# Patient Record
Sex: Male | Born: 1950 | Race: White | Hispanic: No | Marital: Single | Smoking: Never smoker
Health system: Southern US, Community
[De-identification: ages and names within clinical notes are randomized; demographics above are authoritative.]

## PROBLEM LIST (undated history)

## (undated) DIAGNOSIS — F845 Asperger's syndrome: Secondary | ICD-10-CM

## (undated) DIAGNOSIS — Z8614 Personal history of Methicillin resistant Staphylococcus aureus infection: Secondary | ICD-10-CM

## (undated) DIAGNOSIS — J45909 Unspecified asthma, uncomplicated: Secondary | ICD-10-CM

## (undated) DIAGNOSIS — K5790 Diverticulosis of intestine, part unspecified, without perforation or abscess without bleeding: Secondary | ICD-10-CM

## (undated) DIAGNOSIS — D649 Anemia, unspecified: Secondary | ICD-10-CM

## (undated) DIAGNOSIS — I10 Essential (primary) hypertension: Secondary | ICD-10-CM

## (undated) DIAGNOSIS — K219 Gastro-esophageal reflux disease without esophagitis: Secondary | ICD-10-CM

## (undated) DIAGNOSIS — E871 Hypo-osmolality and hyponatremia: Secondary | ICD-10-CM

## (undated) DIAGNOSIS — L409 Psoriasis, unspecified: Secondary | ICD-10-CM

## (undated) DIAGNOSIS — I38 Endocarditis, valve unspecified: Secondary | ICD-10-CM

## (undated) DIAGNOSIS — I4891 Unspecified atrial fibrillation: Secondary | ICD-10-CM

## (undated) HISTORY — PX: COLONOSCOPY: SHX174

---

## 2005-08-08 ENCOUNTER — Ambulatory Visit: Payer: Self-pay | Admitting: Gastroenterology

## 2007-06-17 ENCOUNTER — Ambulatory Visit: Payer: Self-pay | Admitting: Family Medicine

## 2011-01-14 ENCOUNTER — Emergency Department: Payer: Self-pay | Admitting: Emergency Medicine

## 2014-09-20 DIAGNOSIS — I340 Nonrheumatic mitral (valve) insufficiency: Secondary | ICD-10-CM | POA: Insufficient documentation

## 2018-04-22 ENCOUNTER — Encounter: Payer: Self-pay | Admitting: *Deleted

## 2018-04-23 ENCOUNTER — Ambulatory Visit: Payer: Medicare Other | Admitting: Anesthesiology

## 2018-04-23 ENCOUNTER — Encounter
Admission: RE | Disposition: A | Discharge status: Home / Self Care | Payer: Self-pay | Source: Ambulatory Visit | Attending: Gastroenterology

## 2018-04-23 ENCOUNTER — Ambulatory Visit
Admission: RE | Admit: 2018-04-23 | Discharge: 2018-04-23 | Disposition: A | Discharge status: Home / Self Care | Payer: Medicare Other | Source: Ambulatory Visit | Attending: Gastroenterology | Admitting: Gastroenterology

## 2018-04-23 ENCOUNTER — Other Ambulatory Visit: Payer: Self-pay

## 2018-04-23 DIAGNOSIS — I4891 Unspecified atrial fibrillation: Secondary | ICD-10-CM | POA: Diagnosis not present

## 2018-04-23 DIAGNOSIS — K219 Gastro-esophageal reflux disease without esophagitis: Secondary | ICD-10-CM | POA: Diagnosis not present

## 2018-04-23 DIAGNOSIS — I10 Essential (primary) hypertension: Secondary | ICD-10-CM | POA: Insufficient documentation

## 2018-04-23 DIAGNOSIS — Z1211 Encounter for screening for malignant neoplasm of colon: Secondary | ICD-10-CM | POA: Diagnosis not present

## 2018-04-23 DIAGNOSIS — K644 Residual hemorrhoidal skin tags: Secondary | ICD-10-CM | POA: Insufficient documentation

## 2018-04-23 DIAGNOSIS — Z79899 Other long term (current) drug therapy: Secondary | ICD-10-CM | POA: Diagnosis not present

## 2018-04-23 DIAGNOSIS — F845 Asperger's syndrome: Secondary | ICD-10-CM | POA: Insufficient documentation

## 2018-04-23 DIAGNOSIS — J45909 Unspecified asthma, uncomplicated: Secondary | ICD-10-CM | POA: Insufficient documentation

## 2018-04-23 DIAGNOSIS — Z8614 Personal history of Methicillin resistant Staphylococcus aureus infection: Secondary | ICD-10-CM | POA: Diagnosis not present

## 2018-04-23 DIAGNOSIS — K5730 Diverticulosis of large intestine without perforation or abscess without bleeding: Secondary | ICD-10-CM | POA: Diagnosis not present

## 2018-04-23 HISTORY — DX: Endocarditis, valve unspecified: I38

## 2018-04-23 HISTORY — DX: Psoriasis, unspecified: L40.9

## 2018-04-23 HISTORY — PX: COLONOSCOPY WITH PROPOFOL: SHX5780

## 2018-04-23 HISTORY — DX: Hypo-osmolality and hyponatremia: E87.1

## 2018-04-23 HISTORY — DX: Anemia, unspecified: D64.9

## 2018-04-23 HISTORY — DX: Essential (primary) hypertension: I10

## 2018-04-23 HISTORY — DX: Unspecified asthma, uncomplicated: J45.909

## 2018-04-23 HISTORY — DX: Personal history of Methicillin resistant Staphylococcus aureus infection: Z86.14

## 2018-04-23 HISTORY — DX: Asperger's syndrome: F84.5

## 2018-04-23 HISTORY — DX: Unspecified atrial fibrillation: I48.91

## 2018-04-23 HISTORY — DX: Diverticulosis of intestine, part unspecified, without perforation or abscess without bleeding: K57.90

## 2018-04-23 HISTORY — DX: Gastro-esophageal reflux disease without esophagitis: K21.9

## 2018-04-23 SURGERY — COLONOSCOPY WITH PROPOFOL
Anesthesia: General

## 2018-04-23 MED ORDER — FENTANYL CITRATE (PF) 100 MCG/2ML IJ SOLN
INTRAMUSCULAR | Status: DC | PRN
  Administered 2018-04-23 (×2): 50 ug via INTRAVENOUS

## 2018-04-23 MED ORDER — FENTANYL CITRATE (PF) 100 MCG/2ML IJ SOLN
INTRAMUSCULAR | Status: AC
  Filled 2018-04-23: qty 2

## 2018-04-23 MED ORDER — PROPOFOL 500 MG/50ML IV EMUL
INTRAVENOUS | Status: AC
  Filled 2018-04-23: qty 50

## 2018-04-23 MED ORDER — LIDOCAINE 2% (20 MG/ML) 5 ML SYRINGE
INTRAMUSCULAR | Status: DC | PRN
  Administered 2018-04-23: 30 mg via INTRAVENOUS

## 2018-04-23 MED ORDER — PROPOFOL 10 MG/ML IV BOLUS
INTRAVENOUS | Status: DC | PRN
  Administered 2018-04-23: 100 mg via INTRAVENOUS

## 2018-04-23 MED ORDER — PHENYLEPHRINE HCL 10 MG/ML IJ SOLN
INTRAMUSCULAR | Status: DC | PRN
  Administered 2018-04-23: 100 ug via INTRAVENOUS

## 2018-04-23 MED ORDER — SODIUM CHLORIDE 0.9 % IV SOLN
INTRAVENOUS | Status: DC
  Administered 2018-04-23: 11:00:00 via INTRAVENOUS

## 2018-04-23 MED ORDER — PROPOFOL 500 MG/50ML IV EMUL
INTRAVENOUS | Status: DC | PRN
  Administered 2018-04-23: 160 ug/kg/min via INTRAVENOUS

## 2018-04-23 NOTE — Op Note (Signed)
[facility] Gastroenterology Patient Name: Carl HeinzDaniel Oo Procedure Date: 04/23/2018 11:05 AM MRN: [id number] Account #: [id number] Date of Birth: [birth year] Admit Type: Outpatient Age: [age] Room: [facility] ENDO ROOM 1 Gender: Male Note Status: Finalized Procedure:            Colonoscopy Indications:          Screening for colorectal malignant neoplasm Providers:            Christena DeemMartin U. Mirenda Baltazar, MD Referring MD:         Gracelyn NurseJohn D. Johnston, MD (Referring MD) Medicines:            Monitored Anesthesia Care Complications:        No immediate complications. Procedure:            Pre-Anesthesia Assessment:                       - ASA Grade Assessment: III - A patient with severe                        systemic disease.                       After obtaining informed consent, the colonoscope was                        passed under direct vision. Throughout the procedure,                        the patient's blood pressure, pulse, and oxygen                        saturations were monitored continuously. The                        Colonoscope was introduced through the anus and                        advanced to the the cecum, identified by appendiceal                        orifice and ileocecal valve. The colonoscopy was                        performed with moderate difficulty due to poor bowel                        prep. Successful completion of the procedure was aided                        by lavage. The quality of the bowel preparation was                        fair. Findings:      Multiple small and large-mouthed diverticula were found in the sigmoid       colon and descending colon.      The exam was otherwise without abnormality.      The retroflexed view of the distal rectum and anal verge was normal and       showed no anal or rectal abnormalities.      Non-bleeding external hemorrhoids were found during perianal  exam. The       hemorrhoids were small.      The  digital rectal exam was normal otherwise. Impression:           - Preparation of the colon was fair.                       - Diverticulosis in the sigmoid colon and in the                        descending colon.                       - The examination was otherwise normal.                       - The distal rectum and anal verge are normal on                        retroflexion view.                       - Non-bleeding external hemorrhoids.                       - No specimens collected. Recommendation:       - Discharge patient to home.                       - Repeat colonoscopy in 10 years for screening purposes. Procedure Code(s):    --- Professional ---                       [phone], Colonoscopy, flexible; diagnostic, including                        collection of specimen(s) by brushing or washing, when                        performed (separate procedure) Diagnosis Code(s):    --- Professional ---                       Z12.11, Encounter for screening for malignant neoplasm                        of colon                       K64.4, Residual hemorrhoidal skin tags                       K57.30, Diverticulosis of large intestine without                        perforation or abscess without bleeding CPT copyright 2017 American [employer] Association. All rights reserved. The codes documented in this report are preliminary and upon coder review may  be revised to meet current compliance requirements. Christena Deem, MD 04/23/2018 11:34:01 AM This report has been signed electronically. Number of Addenda: 0 Note Initiated On: 04/23/2018 11:05 AM Scope Withdrawal Time: 0 hours 4 minutes 56 seconds  Total Procedure Duration: 0 hours 20 minutes 1 second       [facility]

## 2018-04-23 NOTE — Anesthesia Postprocedure Evaluation (Signed)
Anesthesia Post Note  Patient: Carl HeinzDaniel Sinyard  Procedure(s) Performed: COLONOSCOPY WITH PROPOFOL (N/A )  Patient location during evaluation: Endoscopy Anesthesia Type: General Level of consciousness: awake and alert Pain management: pain level controlled Vital Signs Assessment: post-procedure vital signs reviewed and stable Respiratory status: spontaneous breathing and respiratory function stable Cardiovascular status: stable Anesthetic complications: no     Last Vitals:  Vitals:   04/23/18 1155 04/23/18 1220  BP: (!) 113/51   Pulse:  (!) 59  Resp:  13  Temp:    SpO2:  100%    Last Pain:  Vitals:   04/23/18 1220  TempSrc:   PainSc: 0-No pain                 KEPHART,Jacere K

## 2018-04-23 NOTE — Anesthesia Post-op Follow-up Note (Signed)
Anesthesia QCDR form completed.        

## 2018-04-23 NOTE — Transfer of Care (Signed)
Immediate Anesthesia Transfer of Care Note  Patient: Carl HeinzDaniel Scheck  Procedure(s) Performed: COLONOSCOPY WITH PROPOFOL (N/A )  Patient Location: PACU and Endoscopy Unit  Anesthesia Type:General  Level of Consciousness: drowsy  Airway & Oxygen Therapy: Patient Spontanous Breathing and Patient connected to nasal cannula oxygen  Post-op Assessment: Report given to RN and Post -op Vital signs reviewed and stable  Post vital signs: Reviewed and stable  Last Vitals:  Vitals Value Taken Time  BP    Temp    Pulse 65 04/23/2018 11:40 AM  Resp 10 04/23/2018 11:40 AM  SpO2 98 % 04/23/2018 11:40 AM  Vitals shown include unvalidated device data.  Last Pain:  Vitals:   04/23/18 1139  TempSrc: (P) Tympanic  PainSc:          Complications: No apparent anesthesia complications

## 2018-04-23 NOTE — H&P (Signed)
Outpatient short stay form Pre-procedure 04/23/2018 11:00 AM Carl DeemMartin U [name] MD  Primary Physician: [name], MD  Reason for visit: Colonoscopy  History of present illness: Patient is a [age] year old male presenting today as above.  His last colonoscopy was 2006.  At that time there were no polyps.  He tolerated his prep well.  He takes no aspirin or blood thinning agent.    Current Facility-Administered Medications:  .  0.9 %  sodium chloride infusion, , Intravenous, Continuous, Carl DeemSkulskie, [name], MD, Last Rate: 20 mL/hr at 04/23/18 1053  Medications Prior to Admission  Medication Sig Dispense Refill Last Dose  . ascorbic acid (VITAMIN C) 1000 MG tablet Take 1,000 mg by mouth daily.   Past Week at Unknown time  . atenolol (TENORMIN) 25 MG tablet Take by mouth 2 (two) times daily.    04/23/2018 at 0800  . Calcium Citrate-Vitamin D3 315-250 MG-UNIT TABS Take 2 tablets by mouth 2 (two) times daily.   Past Week at Unknown time  . folic acid (FOLVITE) 1 MG tablet Take 1 mg by mouth daily.   Past Week at Unknown time  . Garlic 100 MG TABS Take 1 tablet by mouth.   Past Week at Unknown time  . lisinopril (PRINIVIL,ZESTRIL) 10 MG tablet Take 10 mg by mouth daily.   04/23/2018 at 0000  . montelukast (SINGULAIR) 10 MG tablet Take 10 mg by mouth at bedtime.   04/22/2018 at Unknown time  . pantoprazole (PROTONIX) 40 MG tablet Take 40 mg by mouth daily.   04/22/2018 at Unknown time  . Zinc Citrate-Phytase 25-500 MG CAPS Take by mouth.   Past Week at Unknown time     No Known Allergies   Past Medical History:  Diagnosis Date  . Anemia   . Asperger's syndrome   . Asthma   . Atrial fibrillation (HCC)   . Diverticulosis   . GERD (gastroesophageal reflux disease)   . History of MRSA infection   . Hypertension   . Hyponatremia   . Psoriasis   . Valvular heart disease     Review of systems:      Physical Exam    Heart and lungs: Rate and rhythm without rub or gallop, lungs are  bilaterally clear.    HEENT: Normocephalic atraumatic eyes are anicteric    Other:    Pertinant exam for procedure: Soft nontender nondistended bowel sounds positive normoactive    Planned proceedures: Colonoscopy and indicated procedures. I have discussed the risks benefits and complications of procedures to include not limited to bleeding, infection, perforation and the risk of sedation and the patient wishes to proceed.    Carl DeemMartin U [name], MD Gastroenterology 04/23/2018  11:00 AM

## 2018-04-23 NOTE — OR Nursing (Signed)
Left leg brown colored skin, Dr. Marva PandaSkulskie aware, PVD statous dermatitis

## 2018-04-23 NOTE — Anesthesia Preprocedure Evaluation (Signed)
Anesthesia Evaluation  Patient identified by MRN, date of birth, ID band Patient awake    Reviewed: Allergy & Precautions, NPO status , Patient's Chart, lab work & pertinent test results  History of Anesthesia Complications Negative for: history of anesthetic complications  Airway Mallampati: III       Dental   Pulmonary asthma , neg sleep apnea, neg COPD,           Cardiovascular hypertension, Pt. on medications and Pt. on home beta blockers (-) Past MI and (-) CHF + dysrhythmias (hx of afib) Atrial Fibrillation (-) Valvular Problems/Murmurs     Neuro/Psych neg Seizures    GI/Hepatic Neg liver ROS, GERD  Medicated and Controlled,  Endo/Other  neg diabetes  Renal/GU negative Renal ROS     Musculoskeletal   Abdominal   Peds  Hematology  (+) anemia ,   Anesthesia Other Findings   Reproductive/Obstetrics                             Anesthesia Physical Anesthesia Plan  ASA: III  Anesthesia Plan: General   Post-op Pain Management:    Induction: Intravenous  PONV Risk Score and Plan: 2 and TIVA and Propofol infusion  Airway Management Planned: Nasal Cannula  Additional Equipment:   Intra-op Plan:   Post-operative Plan:   Informed Consent: I have reviewed the patients History and Physical, chart, labs and discussed the procedure including the risks, benefits and alternatives for the proposed anesthesia with the patient or authorized representative who has indicated his/her understanding and acceptance.     Plan Discussed with:   Anesthesia Plan Comments:         Anesthesia Quick Evaluation

## 2018-04-26 ENCOUNTER — Encounter: Payer: Self-pay | Admitting: Gastroenterology

## 2018-06-23 DIAGNOSIS — E119 Type 2 diabetes mellitus without complications: Secondary | ICD-10-CM | POA: Insufficient documentation

## 2021-04-28 ENCOUNTER — Encounter: Payer: Self-pay | Admitting: Emergency Medicine

## 2021-04-28 ENCOUNTER — Other Ambulatory Visit: Payer: Self-pay

## 2021-04-28 ENCOUNTER — Inpatient Hospital Stay
Admission: EM | Admit: 2021-04-28 | Discharge: 2021-04-29 | DRG: 641 | Disposition: A | Discharge status: Home / Self Care | Payer: Medicare Other | Attending: Internal Medicine | Admitting: Internal Medicine

## 2021-04-28 DIAGNOSIS — K219 Gastro-esophageal reflux disease without esophagitis: Secondary | ICD-10-CM | POA: Diagnosis present

## 2021-04-28 DIAGNOSIS — F845 Asperger's syndrome: Secondary | ICD-10-CM | POA: Diagnosis present

## 2021-04-28 DIAGNOSIS — E871 Hypo-osmolality and hyponatremia: Principal | ICD-10-CM | POA: Diagnosis present

## 2021-04-28 DIAGNOSIS — I480 Paroxysmal atrial fibrillation: Secondary | ICD-10-CM | POA: Diagnosis present

## 2021-04-28 DIAGNOSIS — I10 Essential (primary) hypertension: Secondary | ICD-10-CM | POA: Diagnosis present

## 2021-04-28 DIAGNOSIS — R42 Dizziness and giddiness: Secondary | ICD-10-CM | POA: Diagnosis not present

## 2021-04-28 DIAGNOSIS — R55 Syncope and collapse: Secondary | ICD-10-CM

## 2021-04-28 DIAGNOSIS — Z8614 Personal history of Methicillin resistant Staphylococcus aureus infection: Secondary | ICD-10-CM

## 2021-04-28 DIAGNOSIS — L409 Psoriasis, unspecified: Secondary | ICD-10-CM | POA: Diagnosis present

## 2021-04-28 DIAGNOSIS — K5900 Constipation, unspecified: Secondary | ICD-10-CM | POA: Diagnosis present

## 2021-04-28 DIAGNOSIS — Z20822 Contact with and (suspected) exposure to covid-19: Secondary | ICD-10-CM | POA: Diagnosis present

## 2021-04-28 DIAGNOSIS — I4891 Unspecified atrial fibrillation: Secondary | ICD-10-CM

## 2021-04-28 DIAGNOSIS — R531 Weakness: Secondary | ICD-10-CM | POA: Diagnosis present

## 2021-04-28 DIAGNOSIS — J45909 Unspecified asthma, uncomplicated: Secondary | ICD-10-CM

## 2021-04-28 LAB — URINALYSIS, COMPLETE (UACMP) WITH MICROSCOPIC
Bacteria, UA: NONE SEEN
Bilirubin Urine: NEGATIVE
Glucose, UA: 100 mg/dL — AB
Hgb urine dipstick: NEGATIVE
Ketones, ur: NEGATIVE mg/dL
Leukocytes,Ua: NEGATIVE
Nitrite: NEGATIVE
Protein, ur: NEGATIVE mg/dL
Specific Gravity, Urine: 1.01 (ref 1.005–1.030)
Squamous Epithelial / HPF: NONE SEEN (ref 0–5)
WBC, UA: NONE SEEN WBC/hpf (ref 0–5)
pH: 5.5 (ref 5.0–8.0)

## 2021-04-28 LAB — COMPREHENSIVE METABOLIC PANEL
ALT: 28 U/L (ref 0–44)
AST: 27 U/L (ref 15–41)
Albumin: 4.1 g/dL (ref 3.5–5.0)
Alkaline Phosphatase: 49 U/L (ref 38–126)
Anion gap: 9 (ref 5–15)
BUN: 12 mg/dL (ref 8–23)
CO2: 23 mmol/L (ref 22–32)
Calcium: 7.9 mg/dL — ABNORMAL LOW (ref 8.9–10.3)
Chloride: 83 mmol/L — ABNORMAL LOW (ref 98–111)
Creatinine, Ser: 0.61 mg/dL (ref 0.61–1.24)
GFR, Estimated: >60 mL/min (ref >60)
Glucose, Bld: 184 mg/dL — ABNORMAL HIGH (ref 70–99)
Potassium: 3.8 mmol/L (ref 3.5–5.1)
Sodium: 115 mmol/L — CL (ref 135–145)
Total Bilirubin: 1.1 mg/dL (ref 0.3–1.2)
Total Protein: 7 g/dL (ref 6.5–8.1)

## 2021-04-28 LAB — OSMOLALITY: Osmolality: 254 mOsm/kg — ABNORMAL LOW (ref 275–295)

## 2021-04-28 LAB — RESP PANEL BY RT-PCR (FLU A&B, COVID) ARPGX2
Influenza A by PCR: NEGATIVE
Influenza B by PCR: NEGATIVE
SARS Coronavirus 2 by RT PCR: NEGATIVE

## 2021-04-28 LAB — TROPONIN I (HIGH SENSITIVITY)
Troponin I (High Sensitivity): 6 ng/L (ref <18)
Troponin I (High Sensitivity): 9 ng/L (ref <18)

## 2021-04-28 LAB — CBC
HCT: 34.5 % — ABNORMAL LOW (ref 39.0–52.0)
Hemoglobin: 12.2 g/dL — ABNORMAL LOW (ref 13.0–17.0)
MCH: 28.4 pg (ref 26.0–34.0)
MCHC: 35.4 g/dL (ref 30.0–36.0)
MCV: 80.2 fL (ref 80.0–100.0)
Platelets: 211 10*3/uL (ref 150–400)
RBC: 4.3 MIL/uL (ref 4.22–5.81)
RDW: 14.6 % (ref 11.5–15.5)
WBC: 7.6 10*3/uL (ref 4.0–10.5)
nRBC: 0 % (ref 0.0–0.2)

## 2021-04-28 LAB — HIV ANTIBODY (ROUTINE TESTING W REFLEX): HIV Screen 4th Generation wRfx: NONREACTIVE

## 2021-04-28 LAB — SODIUM
Sodium: 120 mmol/L — ABNORMAL LOW (ref 135–145)
Sodium: 128 mmol/L — ABNORMAL LOW (ref 135–145)

## 2021-04-28 LAB — OSMOLALITY, URINE: Osmolality, Ur: 189 mOsm/kg — ABNORMAL LOW (ref 300–900)

## 2021-04-28 LAB — SODIUM, URINE, RANDOM: Sodium, Ur: 45 mmol/L

## 2021-04-28 LAB — CBG MONITORING, ED: Glucose-Capillary: 194 mg/dL — ABNORMAL HIGH (ref 70–99)

## 2021-04-28 MED ORDER — SODIUM CHLORIDE 0.9 % IV SOLN: Freq: Once | INTRAVENOUS | Status: AC

## 2021-04-28 MED ORDER — HYOSCYAMINE SULFATE ER 0.375 MG PO TB12
0.3750 mg | ORAL_TABLET | Freq: Two times a day (BID) | ORAL | Status: DC | PRN
  Filled 2021-04-28: qty 1

## 2021-04-28 MED ORDER — ALBUTEROL SULFATE HFA 108 (90 BASE) MCG/ACT IN AERS: 2.0000 | INHALATION_SPRAY | Freq: Four times a day (QID) | RESPIRATORY_TRACT | Status: DC | PRN

## 2021-04-28 MED ORDER — ATENOLOL 25 MG PO TABS
25.0000 mg | ORAL_TABLET | Freq: Two times a day (BID) | ORAL | Status: DC
  Administered 2021-04-28 – 2021-04-29 (×2): 25 mg via ORAL
  Filled 2021-04-28 (×2): qty 1

## 2021-04-28 MED ORDER — POLYETHYLENE GLYCOL 3350 17 G PO PACK
17.0000 g | PACK | Freq: Every day | ORAL | Status: DC
  Filled 2021-04-28: qty 1

## 2021-04-28 MED ORDER — PANTOPRAZOLE SODIUM 40 MG PO TBEC
40.0000 mg | DELAYED_RELEASE_TABLET | Freq: Every day | ORAL | Status: DC
  Administered 2021-04-29: 40 mg via ORAL
  Filled 2021-04-28 (×2): qty 1

## 2021-04-28 MED ORDER — HYDROCODONE-ACETAMINOPHEN 5-325 MG PO TABS
1.0000 | ORAL_TABLET | ORAL | Status: DC | PRN
Start: 2021-04-28 — End: 2021-04-29
  Administered 2021-04-28: 1 via ORAL
  Filled 2021-04-28: qty 2

## 2021-04-28 MED ORDER — ENOXAPARIN SODIUM 80 MG/0.8ML IJ SOSY
0.5000 mg/kg | PREFILLED_SYRINGE | INTRAMUSCULAR | Status: DC
  Filled 2021-04-28 (×2): qty 0.65

## 2021-04-28 MED ORDER — ACETAMINOPHEN 325 MG PO TABS: 650.0000 mg | ORAL_TABLET | Freq: Four times a day (QID) | ORAL | Status: DC | PRN

## 2021-04-28 MED ORDER — ACETAMINOPHEN 650 MG RE SUPP: 650.0000 mg | Freq: Four times a day (QID) | RECTAL | Status: DC | PRN

## 2021-04-28 MED ORDER — METHOCARBAMOL 500 MG PO TABS
500.0000 mg | ORAL_TABLET | Freq: Three times a day (TID) | ORAL | Status: DC | PRN
  Administered 2021-04-28 (×2): 500 mg via ORAL
  Filled 2021-04-28 (×4): qty 1

## 2021-04-28 MED ORDER — ALBUTEROL SULFATE (2.5 MG/3ML) 0.083% IN NEBU
2.5000 mg | INHALATION_SOLUTION | Freq: Four times a day (QID) | RESPIRATORY_TRACT | Status: DC | PRN
  Administered 2021-04-29: 2.5 mg via RESPIRATORY_TRACT
  Filled 2021-04-28: qty 3

## 2021-04-28 MED ORDER — ONDANSETRON HCL 4 MG/2ML IJ SOLN: 4.0000 mg | Freq: Four times a day (QID) | INTRAMUSCULAR | Status: DC | PRN

## 2021-04-28 MED ORDER — LORATADINE 10 MG PO TABS
10.0000 mg | ORAL_TABLET | Freq: Every day | ORAL | Status: DC
  Administered 2021-04-28 – 2021-04-29 (×2): 10 mg via ORAL
  Filled 2021-04-28 (×2): qty 1

## 2021-04-28 MED ORDER — SENNA 8.6 MG PO TABS: 1.0000 | ORAL_TABLET | Freq: Every day | ORAL | Status: DC | PRN

## 2021-04-28 MED ORDER — FOLIC ACID 1 MG PO TABS
1.0000 mg | ORAL_TABLET | Freq: Every day | ORAL | Status: DC
  Administered 2021-04-29: 1 mg via ORAL
  Filled 2021-04-28 (×2): qty 1

## 2021-04-28 MED ORDER — ONDANSETRON HCL 4 MG PO TABS: 4.0000 mg | ORAL_TABLET | Freq: Four times a day (QID) | ORAL | Status: DC | PRN

## 2021-04-28 MED ORDER — LISINOPRIL 10 MG PO TABS
10.0000 mg | ORAL_TABLET | Freq: Every day | ORAL | Status: DC
  Administered 2021-04-28 – 2021-04-29 (×2): 10 mg via ORAL
  Filled 2021-04-28 (×2): qty 1

## 2021-04-28 NOTE — ED Notes (Signed)
Pt states 'my leg is cramping up I am dying here and I need some medicine right away, I have been yelling for a long time and no one has come,' notified pt that provider is in an emergency at the moment but will relay, pillows provided

## 2021-04-28 NOTE — ED Notes (Signed)
Pt continuing to yell out c/o leg spasms, messaged pharmacy to verify pain meds that were ordered

## 2021-04-28 NOTE — Progress Notes (Signed)
Anticoagulation monitoring(Lovenox):  70 yo male ordered Lovenox 40 mg Q24h    Filed Weights   04/28/21 0135  Weight: 131.5 kg (290 lb)   BMI 35.3    Lab Results  Component Value Date   CREATININE 0.61 04/28/2021   Estimated Creatinine Clearance: 129.1 mL/min (by C-G formula based on SCr of 0.61 mg/dL). Hemoglobin & Hematocrit     Component Value Date/Time   HGB 12.2 (L) 04/28/2021 0141   HCT 34.5 (L) 04/28/2021 0141     Per Protocol for Patient with estCrcl > 30 ml/min and BMI > 30, will transition to Lovenox 65 mg Q24h.

## 2021-04-28 NOTE — ED Notes (Signed)
Pt c/o feeling faint at this time and demanding a bed. This RN over to patient, introduced self to patient, and obtained VS, explained will pull patient back to triage to complete triage momentarily.

## 2021-04-28 NOTE — ED Triage Notes (Addendum)
Pt arrived via [facility] states his sodium levels are low because he drank too much water because he has been having frequent bowel movements.  Pt continuously asking to lay down and states he is going to pass out multiple times.   Pt also c/o abd pain.

## 2021-04-28 NOTE — ED Notes (Signed)
Pt given breakfast tray.  Pt also has c/o of R leg muscle cramps, pt states pain medication did not help, MD secure messaged to request a possible muscle relaxer.

## 2021-04-28 NOTE — H&P (Addendum)
History and Physical    Carl [name] OQH:[id number] DOB: 02-Apr-1951 DOA: 04/28/2021  PCP: Gracelyn Nurse, MD   Patient coming from: home  I have personally briefly reviewed patient's old medical records in [facility]  Chief Complaint: lightheadedness  HPI: Carl [name] is a 70 y.o. male with medical history significant for Asperger's syndrome, HTN, asthma, A. fib not on anticoagulation,  with history of hyponatremia over 10 years ago, who presents to the ED with a sensation of feeling like he was going to pass out and feeling like his sodium is low.  Patient admits to drinking a lot of water to help with his constipation as well as to help with his 'heart condition'.  He states that on the night of arrival he used more salt on his food than he should have and tried to remedy it by drinking a much greater quantity of water than his usual stating he drank a glass of the glass, easily exceeding a gallon.  He started feeling lightheaded and knew his sodium was low and decided to come in to get checked out he has associated cramping in his legs and feels bloated in his abdomen.  On my assessment he denies chest pain, shortness of breath or palpitations.  Presently denies lightheadedness.  Denies any recent illness has had no cough, shortness of breath or fever or chills.  Denies nausea and vomiting or abdominal pain.  ED course: On arrival, afebrile, BP 187/76, pulse 76, O2 sat 99% on room air Blood work significant for sodium of 115 but otherwise unremarkable.  Troponin of 6  EKG, personally viewed and interpreted sinus rhythm at 79 with no acute ST-T wave changes  Patient initially given a 1 L fluid bolus which was since discontinued.  Hospitalist consulted for admission  Review of Systems: As per HPI otherwise all other systems on review of systems negative.    Past Medical History:  Diagnosis Date   Anemia    Asperger's syndrome    Asthma    Atrial fibrillation  (HCC)    Diverticulosis    GERD (gastroesophageal reflux disease)    History of MRSA infection    Hypertension    Hyponatremia    Psoriasis    Valvular heart disease     Past Surgical History:  Procedure Laterality Date   COLONOSCOPY     COLONOSCOPY WITH PROPOFOL N/A 04/23/2018   Procedure: COLONOSCOPY WITH PROPOFOL;  Surgeon: Christena Deem, MD;  Location: [facility] ENDOSCOPY;  Service: Endoscopy;  Laterality: N/A;     reports that he has never smoked. He has never used smokeless tobacco. He reports that he does not use drugs. No history on file for alcohol use.  No Known Allergies  History reviewed. No pertinent family history.    Prior to Admission medications   Medication Sig Start Date End Date Taking? Authorizing Provider  ascorbic acid (VITAMIN C) 1000 MG tablet Take 1,000 mg by mouth daily.    [provider]  atenolol (TENORMIN) 25 MG tablet Take by mouth 2 (two) times daily.     [provider]  Calcium Citrate-Vitamin D3 315-250 MG-UNIT TABS Take 2 tablets by mouth 2 (two) times daily.    [provider]  folic acid (FOLVITE) 1 MG tablet Take 1 mg by mouth daily.    [provider]  Garlic 100 MG TABS Take 1 tablet by mouth.    [provider]  lisinopril (PRINIVIL,ZESTRIL) 10 MG tablet Take 10  mg by mouth daily.    [provider]  montelukast (SINGULAIR) 10 MG tablet Take 10 mg by mouth at bedtime.    [provider]  pantoprazole (PROTONIX) 40 MG tablet Take 40 mg by mouth daily.    [provider]  Zinc Citrate-Phytase 25-500 MG CAPS Take by mouth.    [provider]    Physical Exam: Vitals:   04/28/21 0110 04/28/21 0135 04/28/21 0255 04/28/21 0330  BP: (!) 187/76  (!) 174/74 (!) 168/76  Pulse: 76  86 99  Resp: (!) 24  (!) 34 (!) 28  Temp:   (!) 97.5 F (36.4 C)   TempSrc:   Oral   SpO2: 99%  98% 100%  Weight:  131.5 kg    Height:  6\' 4"  (1.93 m)       Vitals:   04/28/21  0110 04/28/21 0135 04/28/21 0255 04/28/21 0330  BP: (!) 187/76  (!) 174/74 (!) 168/76  Pulse: 76  86 99  Resp: (!) 24  (!) 34 (!) 28  Temp:   (!) 97.5 F (36.4 C)   TempSrc:   Oral   SpO2: 99%  98% 100%  Weight:  131.5 kg    Height:  6\' 4"  (1.93 m)        Constitutional: Alert and oriented x 3 . Not in any apparent distress HEENT:      Head: Normocephalic and atraumatic.         Eyes: PERLA, EOMI, Conjunctivae are normal. Sclera is non-icteric.       Mouth/Throat: Mucous membranes are moist.       Neck: Supple with no signs of meningismus. Cardiovascular: Regular rate and rhythm. No murmurs, gallops, or rubs. 2+ symmetrical distal pulses are present . No JVD. No LE edema Respiratory: Respiratory effort normal .Lungs sounds clear bilaterally. No wheezes, crackles, or rhonchi.  Gastrointestinal: Soft, mildly distended and tender, positive bowel sounds.  Genitourinary: No CVA tenderness. Musculoskeletal: Nontender with normal range of motion in all extremities. No cyanosis, or erythema of extremities. Neurologic:  Face is symmetric. Moving all extremities. No gross focal neurologic deficits . Skin: Skin is warm, dry.  No rash or ulcers Psychiatric: Mood and affect are normal    Labs on Admission: I have personally reviewed following labs and imaging studies  CBC: Recent Labs  Lab 04/28/21 0141  WBC 7.6  HGB 12.2*  HCT 34.5*  MCV 80.2  PLT 211   Basic Metabolic Panel: Recent Labs  Lab 04/28/21 0141  NA 115*  K 3.8  CL 83*  CO2 23  GLUCOSE 184*  BUN 12  CREATININE 0.61  CALCIUM 7.9*   GFR: Estimated Creatinine Clearance: 129.1 mL/min (by C-G formula based on SCr of 0.61 mg/dL). Liver Function Tests: Recent Labs  Lab 04/28/21 0141  AST 27  ALT 28  ALKPHOS 49  BILITOT 1.1  PROT 7.0  ALBUMIN 4.1   No results for input(s): LIPASE, AMYLASE in the last 168 hours. No results for input(s): AMMONIA in the last 168 hours. Coagulation Profile: No results for  input(s): INR, PROTIME in the last 168 hours. Cardiac Enzymes: No results for input(s): CKTOTAL, CKMB, CKMBINDEX, TROPONINI in the last 168 hours. BNP (last 3 results) No results for input(s): PROBNP in the last 8760 hours. HbA1C: No results for input(s): HGBA1C in the last 72 hours. CBG: Recent Labs  Lab 04/28/21 0141  GLUCAP 194*   Lipid Profile: No results for input(s): CHOL, HDL, LDLCALC, TRIG, CHOLHDL, LDLDIRECT  in the last 72 hours. Thyroid Function Tests: No results for input(s): TSH, T4TOTAL, FREET4, T3FREE, THYROIDAB in the last 72 hours. Anemia Panel: No results for input(s): VITAMINB12, FOLATE, FERRITIN, TIBC, IRON, RETICCTPCT in the last 72 hours. Urine analysis:    Component Value Date/Time   COLORURINE YELLOW 04/28/2021 0246   APPEARANCEUR CLEAR 04/28/2021 0246   LABSPEC 1.010 04/28/2021 0246   PHURINE 5.5 04/28/2021 0246   GLUCOSEU 100 (A) 04/28/2021 0246   HGBUR NEGATIVE 04/28/2021 0246   BILIRUBINUR NEGATIVE 04/28/2021 0246   KETONESUR NEGATIVE 04/28/2021 0246   PROTEINUR NEGATIVE 04/28/2021 0246   NITRITE NEGATIVE 04/28/2021 0246   LEUKOCYTESUR NEGATIVE 04/28/2021 0246    Radiological Exams on Admission: No results found.   Assessment/Plan 70 year old male with history of Asperger's syndrome, HTN, asthma, paroxysmal A. fib not on anticoagulation, presenting with lightheadedness and concern for low sodium level.  Patient admits to drinking a lot of water to help with his recent constipation as well as to help with his salt ingestion.      Hyponatremia, symptomatic, suspect acute   Postural dizziness with presyncope - Sodium 115.  Last sodium on record was 132 in April 2022 - Suspecting hypotonic related to reported increased water intake - Patient not on any meds associated with SIADH, and on no thiazide diuretics - We will hold off on additional saline infusion for now  - Follow urine and sodium osmolality and urine sodium - Neurologic checks, -  Repeat serum sodium in 2 hours - Goal for correction of 6 to 8 mEq per 24 hours    Atrial fibrillation (HCC) - Rate controlled on atenolol - CHA2DS2-VASc of 2 but not on anticoagulation for unknown reason - Patient last saw his cardiologist in August 2022    Asperger's syndrome - No acute issues    Hypertension - BP elevated at 187/76 - Continue atenolol and lisinopril    Constipation - Patient reports a longstanding history of constipation - Senokot as needed - Had a colonoscopy in 2019 with recommendation for follow-up in 10 years    DVT prophylaxis: Lovenox  Code Status: full code  Family Communication:  none  Disposition Plan: Back to previous home environment Consults called: none  Status:At the time of admission, it appears that the appropriate admission status for this patient is INPATIENT. This is judged to be reasonable and necessary in order to provide the required intensity of service to ensure the patient's safety given the presenting symptoms, physical exam findings, and initial radiographic and laboratory data in the context of their  Comorbid conditions.   Patient requires inpatient status due to high intensity of service, high risk for further deterioration and high frequency of surveillance required.   I certify that at the point of admission it is my clinical judgment that the patient will require inpatient hospital care spanning beyond 2 midnights     Carl Baumann MD [facility]     04/28/2021, 4:08 AM

## 2021-04-28 NOTE — ED Notes (Signed)
Urinal spilled onto floor, assisted with cleansing, housekeeper bedside to mop

## 2021-04-28 NOTE — Progress Notes (Addendum)
Patient seen and examined at the bedside.  He complains of dizziness and generalized weakness.  He also reports muscle cramps in his lower extremities.  Restart IV fluids for hydration and monitor sodium level closely.  Resume home antihypertensives for hypertensive urgency.  Consult PT for generalized weakness.

## 2021-04-28 NOTE — ED Notes (Signed)
Chaplin paged to see if can sit with pt until family arrives per pt request

## 2021-04-28 NOTE — ED Notes (Signed)
Pt helped to toilet. Pt seemed to get up and walk ok

## 2021-04-28 NOTE — ED Notes (Signed)
Repositioned in bed.

## 2021-04-28 NOTE — ED Notes (Signed)
NT helped pt to bedside toilet, pt had a BM. Urinal emptied, charted in I&O. Family at bedside. IVF infusing.

## 2021-04-28 NOTE — ED Provider Notes (Signed)
[facility] Emergency Department Provider Note  ____________________________________________   Event Date/Time   First MD Initiated Contact with Patient 04/28/21 0303     (approximate)  I have reviewed the triage vital signs and the nursing notes.   HISTORY  Chief Complaint Weakness and Near Syncope    HPI Carl [name] is a 70 y.o. male with history of Asperger's, atrial fibrillation, hypertension who presents to the emergency department concerns that he is hyponatremic.  States he has had this happen to him before and reports that he felt like he was going to pass out tonight.  States he felt very lightheaded.  He denies chest pain or shortness of breath.  No vomiting or diarrhea.  Does state that he had several loose stools recently and drank a large amount of water to compensate for this.        Past Medical History:  Diagnosis Date   Anemia    Asperger's syndrome    Asthma    Atrial fibrillation (HCC)    Diverticulosis    GERD (gastroesophageal reflux disease)    History of MRSA infection    Hypertension    Hyponatremia    Psoriasis    Valvular heart disease     Patient Active Problem List   Diagnosis Date Noted   Acute hyponatremia 04/28/2021   Atrial fibrillation (HCC)    Asperger's syndrome    Hyponatremia    Hypertension    Asthma    Postural dizziness with presyncope    Type 2 diabetes mellitus without complication, without long-term current use of insulin (HCC) 06/23/2018    Past Surgical History:  Procedure Laterality Date   COLONOSCOPY     COLONOSCOPY WITH PROPOFOL N/A 04/23/2018   Procedure: COLONOSCOPY WITH PROPOFOL;  Surgeon: Christena Deem, MD;  Location: [facility] ENDOSCOPY;  Service: Endoscopy;  Laterality: N/A;    Prior to Admission medications   Medication Sig Start Date End Date Taking? Authorizing Provider  ascorbic acid (VITAMIN C) 1000 MG tablet Take 1,000 mg by mouth daily.    [provider]  atenolol (TENORMIN) 25 MG tablet Take by mouth 2 (two) times daily.     [provider]  Calcium Citrate-Vitamin D3 315-250 MG-UNIT TABS Take 2 tablets by mouth 2 (two) times daily.    [provider]  folic acid (FOLVITE) 1 MG tablet Take 1 mg by mouth daily.    [provider]  Garlic 100 MG TABS Take 1 tablet by mouth.    [provider]  lisinopril (PRINIVIL,ZESTRIL) 10 MG tablet Take 10 mg by mouth daily.    [provider]  montelukast (SINGULAIR) 10 MG tablet Take 10 mg by mouth at bedtime.    [provider]  pantoprazole (PROTONIX) 40 MG tablet Take 40 mg by mouth daily.    [provider]  Zinc Citrate-Phytase 25-500 MG CAPS Take by mouth.    [provider]    Allergies Patient has no known allergies.  History reviewed. No pertinent family history.  Social History Social History   Tobacco Use   Smoking status: Never   Smokeless tobacco: Never  Vaping Use   Vaping Use: Never used  Substance Use Topics   Drug use: Never    Review of Systems Constitutional: No fever. Eyes: No visual changes. ENT: No sore throat. Cardiovascular: Denies chest pain. Respiratory: Denies shortness of breath. Gastrointestinal: No nausea, vomiting, diarrhea. Genitourinary: Negative for dysuria. Musculoskeletal: Negative for back pain.  Skin: Negative for rash. Neurological: Negative for focal weakness or numbness.  ____________________________________________   PHYSICAL EXAM:  VITAL SIGNS: ED Triage Vitals  Enc Vitals Group     BP 04/28/21 0110 (!) 187/76     Pulse Rate 04/28/21 0110 76     Resp 04/28/21 0110 (!) 24     Temp 04/28/21 0255 (!) 97.5 F (36.4 C)     Temp Source 04/28/21 0255 Oral     SpO2 04/28/21 0110 99 %     Weight 04/28/21 0135 290 lb (131.5 kg)     Height 04/28/21 0135 6\' 4"  (1.93 m)     Head Circumference --      Peak Flow --      Pain Score 04/28/21 0135 6     Pain Loc --       Pain Edu? --      Excl. in GC? --    CONSTITUTIONAL: Alert and oriented and responds appropriately to questions.  Obese, in no distress HEAD: Normocephalic EYES: Conjunctivae clear, pupils appear equal, EOM appear intact ENT: normal nose; moist mucous membranes NECK: Supple, normal ROM CARD: RRR; S1 and S2 appreciated; no murmurs, no clicks, no rubs, no gallops RESP: Normal chest excursion without splinting or tachypnea; breath sounds clear and equal bilaterally; no wheezes, no rhonchi, no rales, no hypoxia or respiratory distress, speaking full sentences ABD/GI: Normal bowel sounds; non-distended; soft, non-tender, no rebound, no guarding, no peritoneal signs, no hepatosplenomegaly BACK: The back appears normal EXT: Normal ROM in all joints; no deformity noted, no edema; no cyanosis SKIN: Normal color for age and race; warm; no rash on exposed skin NEURO: Moves all extremities equally PSYCH: The patient's mood and manner are appropriate.  ____________________________________________   LABS (all labs ordered are listed, but only abnormal results are displayed)  Labs Reviewed  COMPREHENSIVE METABOLIC PANEL - Abnormal; Notable for the following components:      Result Value   Sodium 115 (*)    Chloride 83 (*)    Glucose, Bld 184 (*)    Calcium 7.9 (*)    All other components within normal limits  URINALYSIS, COMPLETE (UACMP) WITH MICROSCOPIC - Abnormal; Notable for the following components:   Glucose, UA 100 (*)    All other components within normal limits  CBC - Abnormal; Notable for the following components:   Hemoglobin 12.2 (*)    HCT 34.5 (*)    All other components within normal limits  CBG MONITORING, ED - Abnormal; Notable for the following components:   Glucose-Capillary 194 (*)    All other components within normal limits  RESP PANEL BY RT-PCR (FLU A&B, COVID) [id number]  TROPONIN I (HIGH SENSITIVITY)  TROPONIN I (HIGH SENSITIVITY)    ____________________________________________  EKG   EKG Interpretation  Date/Time:  Sunday April 28 2021 01:37:24 EDT Ventricular Rate:  79 PR Interval:  192 QRS Duration: 106 QT Interval:  386 QTC Calculation: 442 R Axis:   -25 Text Interpretation: Sinus rhythm with Premature atrial complexes Minimal voltage criteria for LVH, may be normal variant ( R in aVL ) Cannot rule out Anterior infarct , age undetermined Abnormal ECG Confirmed by [id number] [phone]) on 04/28/2021 3:47:51 AM        ____________________________________________  RADIOLOGY 04/30/2021 Adalia Pettis, personally viewed and evaluated these images (plain radiographs) as part of my medical decision making, as well as reviewing the written report by the radiologist.  ED MD interpretation:    Official radiology report(s): No results  found.  ____________________________________________   PROCEDURES  Procedure(s) performed (including Critical Care):  Procedures  CRITICAL CARE Performed by: Rochele Raring   Total critical care time: 45 minutes  Critical care time was exclusive of separately billable procedures and treating other patients.  Critical care was necessary to treat or prevent imminent or life-threatening deterioration.  Critical care was time spent personally by me on the following activities: development of treatment plan with patient and/or surrogate as well as nursing, discussions with consultants, evaluation of patient's response to treatment, examination of patient, obtaining history from patient or surrogate, ordering and performing treatments and interventions, ordering and review of laboratory studies, ordering and review of radiographic studies, pulse oximetry and re-evaluation of patient's condition.  ____________________________________________   INITIAL IMPRESSION / ASSESSMENT AND PLAN / ED COURSE  As part of my medical decision making, I reviewed the following data within the  electronic MEDICAL RECORD NUMBER Nursing notes reviewed and incorporated, Labs reviewed , EKG interpreted , Old EKG reviewed, Old chart reviewed, Discussed with admitting physician , and Notes from prior ED visits         Patient here with dizziness feeling like he might pass out without any other associated symptoms.  States he was concerned he was hyponatremic.  Sodium here is 115.  It does not appear that he is on any medications that would lower his sodium.  This could be due to his recent increased water intake.  Urine pending.  Likely needs fluid restriction.  Otherwise mentating normally, no seizure-like activity.  Will discuss with medicine for admission.  Discussed patient's case with hospitalist, Dr. Para March.  I have recommended admission and patient (and family if present) agree with this plan. Admitting physician will place admission orders.   I reviewed all nursing notes, vitals, pertinent previous records and reviewed/interpreted all EKGs, lab and urine results, imaging (as available).   ____________________________________________   FINAL CLINICAL IMPRESSION(S) / ED DIAGNOSES  Final diagnoses:  Hyponatremia     ED Discharge Orders     None       *Please note:  Carl [name] was evaluated in Emergency Department on 04/28/2021 for the symptoms described in the history of present illness. He was evaluated in the context of the global COVID-19 pandemic, which necessitated consideration that the patient might be at risk for infection with the SARS-CoV-2 virus that causes COVID-19. Institutional protocols and algorithms that pertain to the evaluation of patients at risk for COVID-19 are in a state of rapid change based on information released by regulatory bodies including the CDC and federal and state organizations. These policies and algorithms were followed during the patient's care in the ED.  Some ED evaluations and interventions may be delayed as a result of limited  staffing during and the pandemic.*   Note:  This document was prepared using Dragon voice recognition software and may include unintentional dictation errors.    Kamori Kitchens, Layla Maw, DO 04/28/21 [phone]

## 2021-04-28 NOTE — ED Triage Notes (Signed)
First RN Note: pt to ED via [facility] with c/o frequent bowel movements, per EMS pt started having frequent bowel movements at 2000, has had 6 since 2000. Per EMS pt thinks his sodium is low and has been drinking water to replenish his sodium and eating crackers. Per EMS pt A&O x4 at this time. Per EMS pt c/o feeling faint, new complaint since arriving at the hospital.    213/96\ -> 190/98 80 98% RA

## 2021-04-29 DIAGNOSIS — R42 Dizziness and giddiness: Secondary | ICD-10-CM | POA: Diagnosis not present

## 2021-04-29 DIAGNOSIS — E871 Hypo-osmolality and hyponatremia: Secondary | ICD-10-CM | POA: Diagnosis not present

## 2021-04-29 DIAGNOSIS — R55 Syncope and collapse: Secondary | ICD-10-CM | POA: Diagnosis not present

## 2021-04-29 LAB — BASIC METABOLIC PANEL
Anion gap: 6 (ref 5–15)
BUN: 8 mg/dL (ref 8–23)
CO2: 26 mmol/L (ref 22–32)
Calcium: 8.4 mg/dL — ABNORMAL LOW (ref 8.9–10.3)
Chloride: 98 mmol/L (ref 98–111)
Creatinine, Ser: 0.59 mg/dL — ABNORMAL LOW (ref 0.61–1.24)
GFR, Estimated: >60 mL/min (ref >60)
Glucose, Bld: 128 mg/dL — ABNORMAL HIGH (ref 70–99)
Potassium: 4.4 mmol/L (ref 3.5–5.1)
Sodium: 130 mmol/L — ABNORMAL LOW (ref 135–145)

## 2021-04-29 MED ORDER — MONTELUKAST SODIUM 10 MG PO TABS
10.0000 mg | ORAL_TABLET | Freq: Every day | ORAL | Status: DC
  Administered 2021-04-29: 10 mg via ORAL
  Filled 2021-04-29: qty 1

## 2021-04-29 NOTE — ED Notes (Signed)
Pt discharged home, ambulatory on discharge. Denies any pain. No signature pad in room, pt verbalized understanding of follow-up and paperwork.

## 2021-04-29 NOTE — ED Notes (Signed)
Assisted pt to restroom. Pt returned to rm and placed on card monitor again. No other needs voiced at this time.

## 2021-04-29 NOTE — Discharge Summary (Signed)
Physician Discharge Summary  Carl [name] VZD:[id number] DOB: 07-23-1951 DOA: 04/28/2021  PCP: Gracelyn Nurse, MD  Admit date: 04/28/2021 Discharge date: 04/29/2021  Discharge disposition: Home   Recommendations for Outpatient Follow-Up:   Follow-up with PCP in 1 week  Discharge Diagnosis:   Principal Problem:   Hyponatremia Active Problems:   Atrial fibrillation (HCC)   Asperger's syndrome   Hypertension   Asthma   Postural dizziness with presyncope   Acute hyponatremia   Constipation    Discharge Condition: Stable.  Diet recommendation:  Diet Order             Diet - low sodium heart healthy           Diet regular Room service appropriate? Yes; Fluid consistency: Thin  Diet effective now                     Code Status: Full Code     Hospital Course:   [name] is a 70 year old man with medical history significant for Asperger's syndrome, hypertension, asthma, atrial fibrillation, remote history of hyponatremia, who presented to the hospital because of dizziness, generalized weakness, sensation of going to pass out and suspicion that his sodium level was low.  His sodium level was 115 on admission.  He was admitted to the hospital for acute symptomatic severe hyponatremia.  He was treated with IV fluids and sodium level improved to 130.  His symptoms improved significantly following improvement in sodium level.  PT was consulted because he complained of generalized weakness.  He was able to ambulate with physical therapist without any problems.  His condition has improved and is deemed stable for discharge to home.  He was admitted as an inpatient.  However, his symptoms and sodium level improved rather quickly so he did not spend more than 2 midnights in the hospital.        Discharge Exam:    Vitals:   04/28/21 2303 04/29/21 0729 04/29/21 1015 04/29/21 1100  BP: 140/70 (!) 156/82 (!) 145/67 114/65  Pulse: 65 60 75  71  Resp: 17 16 16 13   Temp:  98.2 F (36.8 C)    TempSrc:  Oral    SpO2: 96% 98% 100% 100%  Weight:      Height:         GEN: NAD SKIN: No rash EYES: EOMI ENT: MMM CV: RRR PULM: CTA B ABD: soft, obese, NT, +BS CNS: AAO x 3, non focal EXT: No edema or tenderness   The results of significant diagnostics from this hospitalization (including imaging, microbiology, ancillary and laboratory) are listed below for reference.     Procedures and Diagnostic Studies:   No results found.   Labs:   Basic Metabolic Panel: Recent Labs  Lab 04/28/21 0141 04/28/21 0532 04/28/21 1647 04/29/21 0536  NA 115* 120* 128* 130*  K 3.8  --   --  4.4  CL 83*  --   --  98  CO2 23  --   --  26  GLUCOSE 184*  --   --  128*  BUN 12  --   --  8  CREATININE 0.61  --   --  0.59*  CALCIUM 7.9*  --   --  8.4*   GFR Estimated Creatinine Clearance: 129.1 mL/min (A) (by C-G formula based on SCr of 0.59 mg/dL (L)). Liver Function Tests: Recent Labs  Lab 04/28/21 0141  AST 27  ALT 28  ALKPHOS  49  BILITOT 1.1  PROT 7.0  ALBUMIN 4.1   No results for input(s): LIPASE, AMYLASE in the last 168 hours. No results for input(s): AMMONIA in the last 168 hours. Coagulation profile No results for input(s): INR, PROTIME in the last 168 hours.  CBC: Recent Labs  Lab 04/28/21 0141  WBC 7.6  HGB 12.2*  HCT 34.5*  MCV 80.2  PLT 211   Cardiac Enzymes: No results for input(s): CKTOTAL, CKMB, CKMBINDEX, TROPONINI in the last 168 hours. BNP: Invalid input(s): POCBNP CBG: Recent Labs  Lab 04/28/21 0141  GLUCAP 194*   D-Dimer No results for input(s): DDIMER in the last 72 hours. Hgb A1c No results for input(s): HGBA1C in the last 72 hours. Lipid Profile No results for input(s): CHOL, HDL, LDLCALC, TRIG, CHOLHDL, LDLDIRECT in the last 72 hours. Thyroid function studies No results for input(s): TSH, T4TOTAL, T3FREE, THYROIDAB in the last 72 hours.  Invalid input(s): FREET3 Anemia work  up No results for input(s): VITAMINB12, FOLATE, FERRITIN, TIBC, IRON, RETICCTPCT in the last 72 hours. Microbiology Recent Results (from the past 240 hour(s))  Resp Panel by RT-PCR (Flu A&B, Covid) Urine, Clean Catch     Status: None   Collection Time: 04/28/21  2:46 AM   Specimen: Urine, Clean Catch; Nasopharyngeal(NP) swabs in vial transport medium  Result Value Ref Range Status   SARS Coronavirus 2 by RT PCR NEGATIVE NEGATIVE Final    Comment: (NOTE) SARS-CoV-2 target nucleic acids are NOT DETECTED.  The SARS-CoV-2 RNA is generally detectable in upper respiratory specimens during the acute phase of infection. The lowest concentration of SARS-CoV-2 viral copies this assay can detect is 138 copies/mL. A negative result does not preclude SARS-Cov-2 infection and should not be used as the sole basis for treatment or other patient management decisions. A negative result may occur with  improper specimen collection/handling, submission of specimen other than nasopharyngeal swab, presence of viral mutation(s) within the areas targeted by this assay, and inadequate number of viral copies(<138 copies/mL). A negative result must be combined with clinical observations, patient history, and epidemiological information. The expected result is Negative.  Fact Sheet for Patients:  BloggerCourse.com  Fact Sheet for Healthcare Providers:  SeriousBroker.it  This test is no t yet approved or cleared by the [place] FDA and  has been authorized for detection and/or diagnosis of SARS-CoV-2 by FDA under an Emergency Use Authorization (EUA). This EUA will remain  in effect (meaning this test can be used) for the duration of the COVID-19 declaration under Section 564(b)(1) of the Act, 21 U.S.C.section 360bbb-3(b)(1), unless the authorization is terminated  or revoked sooner.       Influenza A by PCR NEGATIVE NEGATIVE Final   Influenza B by  PCR NEGATIVE NEGATIVE Final    Comment: (NOTE) The Xpert Xpress SARS-CoV-2/FLU/RSV plus assay is intended as an aid in the diagnosis of influenza from Nasopharyngeal swab specimens and should not be used as a sole basis for treatment. Nasal washings and aspirates are unacceptable for Xpert Xpress SARS-CoV-2/FLU/RSV testing.  Fact Sheet for Patients: BloggerCourse.com  Fact Sheet for Healthcare Providers: SeriousBroker.it  This test is not yet approved or cleared by the [place] FDA and has been authorized for detection and/or diagnosis of SARS-CoV-2 by FDA under an Emergency Use Authorization (EUA). This EUA will remain in effect (meaning this test can be used) for the duration of the COVID-19 declaration under Section 564(b)(1) of the Act, 21 U.S.C. section 360bbb-3(b)(1), unless the authorization is terminated  or revoked.  Performed at [facility], [address], [place], [place] [address]      Discharge Instructions:   Discharge Instructions     Diet - low sodium heart healthy   Complete by: As directed    Increase activity slowly   Complete by: As directed       Allergies as of 04/29/2021   No Known Allergies      Medication List     STOP taking these medications    Calcium Citrate-Vitamin D3 315-250 MG-UNIT Tabs       TAKE these medications    albuterol 108 (90 Base) MCG/ACT inhaler Commonly known as: VENTOLIN HFA Inhale 2 puffs into the lungs every 6 (six) hours as needed for wheezing.   ascorbic acid 1000 MG tablet Commonly known as: VITAMIN C Take 1,000 mg by mouth daily.   atenolol 25 MG tablet Commonly known as: TENORMIN Take by mouth 2 (two) times daily.   cetirizine 10 MG tablet Commonly known as: ZYRTEC Take 10 mg by mouth daily.   fluticasone 110 MCG/ACT inhaler Commonly known as: FLOVENT HFA Inhale 1 puff into the lungs in the morning and at bedtime.   folic acid  1 MG tablet Commonly known as: FOLVITE Take 1 mg by mouth daily.   Garlic 100 MG Tabs Take 1 tablet by mouth.   hyoscyamine 0.375 MG 12 hr tablet Commonly known as: LEVBID Take 1 tablet by mouth in the morning and at bedtime.   lisinopril 10 MG tablet Commonly known as: ZESTRIL Take 10 mg by mouth daily.   montelukast 10 MG tablet Commonly known as: SINGULAIR Take 10 mg by mouth at bedtime.   mupirocin ointment 2 % Commonly known as: BACTROBAN Apply 1 application topically as needed.   pantoprazole 40 MG tablet Commonly known as: PROTONIX Take 40 mg by mouth daily.   QC TUMERIC COMPLEX PO Take 1 tablet by mouth daily.   SALMON OIL PO Take 1 capsule by mouth daily.   Zinc Citrate-Phytase 25-500 MG Caps Take by mouth.           If you experience worsening of your admission symptoms, develop shortness of breath, life threatening emergency, suicidal or homicidal thoughts you must seek medical attention immediately by calling 911 or calling your MD immediately  if symptoms less severe.   You must read complete instructions/literature along with all the possible adverse reactions/side effects for all the medicines you take and that have been prescribed to you. Take any new medicines after you have completely understood and accept all the possible adverse reactions/side effects.    Please note   You were cared for by a hospitalist during your hospital stay. If you have any questions about your discharge medications or the care you received while you were in the hospital after you are discharged, you can call the unit and asked to speak with the hospitalist on call if the hospitalist that took care of you is not available. Once you are discharged, your primary care physician will handle any further medical issues. Please note that NO REFILLS for any discharge medications will be authorized once you are discharged, as it is imperative that you return to your primary care  physician (or establish a relationship with a primary care physician if you do not have one) for your aftercare needs so that they can reassess your need for medications and monitor your lab values.       Time coordinating discharge: 27  minutes  Signed:  Lurene Shadow  [facility] 04/29/2021, 11:35 AM   Pager on [email or web address]. If 7PM-7AM, please contact night-coverage at [email or web address]

## 2021-04-29 NOTE — Evaluation (Signed)
Physical Therapy Evaluation Patient Details Name: Carl [name] MRN: [id number] DOB: 03-31-1951 Today's Date: 04/29/2021  History of Present Illness  Pt is a 70 y.o. male presenting to hospital with weakness and near syncope/lightheadedness; several loose stools recently.  Pt admitted with symptomatic hyponatremia and postural dizziness with presyncope.  PMH includes Asperger's, a-fib, htn, anemia, psoriasis, postural dizziness with presyncope, DM.  Clinical Impression  Prior to hospital admission, pt was independent with ambulation; lives alone in 1 level home.  Currently pt is independent with transfers and ambulation 200 feet (no AD).  No loss of balance noted during sessions activities.  Pt reporting no adverse symptoms during sessions activities.  Pt appears to be at baseline level of functional mobility; no acute PT needs identified--PT will sign off.      Recommendations for follow up therapy are one component of a multi-disciplinary discharge planning process, led by the attending physician.  Recommendations may be updated based on patient status, additional functional criteria and insurance authorization.  Follow Up Recommendations No PT follow up    Equipment Recommendations  None recommended by PT    Recommendations for Other Services       Precautions / Restrictions Precautions Precautions: None Restrictions Weight Bearing Restrictions: No      Mobility  Bed Mobility               General bed mobility comments: Deferred (pt sitting in recliner beginning/end of session)    Transfers Overall transfer level: Independent Equipment used: None             General transfer comment: steady safe transfers noted  Ambulation/Gait Ambulation/Gait assistance: Independent Gait Distance (Feet): 200 Feet Assistive device: None Gait Pattern/deviations: Step-through pattern Gait velocity: mildly decreased   General Gait Details: mild increased BOS; steady;  increased time to slow down prior to turning around (pt reports this is baseline for him)  Information systems manager Rankin (Stroke Patients Only)       Balance Overall balance assessment: Needs assistance Sitting-balance support: No upper extremity supported;Feet supported Sitting balance-Leahy Scale: Normal Sitting balance - Comments: steady sitting reaching outside BOS   Standing balance support: No upper extremity supported;During functional activity Standing balance-Leahy Scale: Normal Standing balance comment: no loss of balance with ambulation and turns                             Pertinent Vitals/Pain Pain Assessment: No/denies pain Vitals (HR and O2 on room air) stable and WFL throughout treatment session.    Home Living Family/patient expects to be discharged to:: Private residence Living Arrangements: Alone   Type of Home: House Home Access: Stairs to enter Entrance Stairs-Rails: Right;Left;Can reach both Entrance Stairs-Number of Steps: 2-3 Home Layout: One level Home Equipment: None      Prior Function Level of Independence: Independent         Comments: No recent falls.     Hand Dominance        Extremity/Trunk Assessment   Upper Extremity Assessment Upper Extremity Assessment: Overall WFL for tasks assessed    Lower Extremity Assessment Lower Extremity Assessment: Overall WFL for tasks assessed    Cervical / Trunk Assessment Cervical / Trunk Assessment: Other exceptions Cervical / Trunk Exceptions: significant forward head  Communication   Communication: No difficulties  Cognition Arousal/Alertness: Awake/alert Behavior During Therapy: WFL for tasks  assessed/performed Overall Cognitive Status: Within Functional Limits for tasks assessed                                 General Comments: Pt tending to be talkative with increased focus on certain topics at times.      General  Comments  Nursing cleared pt for participation in physical therapy.  Pt agreeable to PT session.    Exercises     Assessment/Plan    PT Assessment Patent does not need any further PT services  PT Problem List         PT Treatment Interventions      PT Goals (Current goals can be found in the Care Plan section)  Acute Rehab PT Goals Patient Stated Goal: to go home PT Goal Formulation: With patient Time For Goal Achievement: 05/13/21 Potential to Achieve Goals: Good    Frequency     Barriers to discharge        Co-evaluation               AM-PAC PT "6 Clicks" Mobility  Outcome Measure Help needed turning from your back to your side while in a flat bed without using bedrails?: None Help needed moving from lying on your back to sitting on the side of a flat bed without using bedrails?: None Help needed moving to and from a bed to a chair (including a wheelchair)?: None Help needed standing up from a chair using your arms (e.g., wheelchair or bedside chair)?: None Help needed to walk in hospital room?: None Help needed climbing 3-5 steps with a railing? : None 6 Click Score: 24    End of Session Equipment Utilized During Treatment: Gait belt Activity Tolerance: Patient tolerated treatment well Patient left: in chair;with call bell/phone within reach Nurse Communication: Mobility status;Precautions PT Visit Diagnosis: Muscle weakness (generalized) (M62.81)    Time: 5784-6962 PT Time Calculation (min) (ACUTE ONLY): 24 min   Charges:   PT Evaluation $PT Eval Low Complexity: 1 Low         Vontrell Pullman, PT 04/29/21, 10:09 AM

## 2021-06-13 ENCOUNTER — Observation Stay: Payer: Medicare Other

## 2021-06-13 ENCOUNTER — Encounter: Payer: Self-pay | Admitting: *Deleted

## 2021-06-13 ENCOUNTER — Other Ambulatory Visit: Payer: Self-pay

## 2021-06-13 ENCOUNTER — Inpatient Hospital Stay
Admission: EM | Admit: 2021-06-13 | Discharge: 2021-06-15 | DRG: 641 | Disposition: A | Discharge status: Home / Self Care | Payer: Medicare Other | Source: Ambulatory Visit | Attending: Internal Medicine | Admitting: Internal Medicine

## 2021-06-13 DIAGNOSIS — Z8614 Personal history of Methicillin resistant Staphylococcus aureus infection: Secondary | ICD-10-CM

## 2021-06-13 DIAGNOSIS — L409 Psoriasis, unspecified: Secondary | ICD-10-CM | POA: Diagnosis present

## 2021-06-13 DIAGNOSIS — G2581 Restless legs syndrome: Secondary | ICD-10-CM | POA: Diagnosis present

## 2021-06-13 DIAGNOSIS — R600 Localized edema: Secondary | ICD-10-CM | POA: Diagnosis present

## 2021-06-13 DIAGNOSIS — R631 Polydipsia: Secondary | ICD-10-CM | POA: Diagnosis present

## 2021-06-13 DIAGNOSIS — J45909 Unspecified asthma, uncomplicated: Secondary | ICD-10-CM | POA: Diagnosis present

## 2021-06-13 DIAGNOSIS — E119 Type 2 diabetes mellitus without complications: Secondary | ICD-10-CM | POA: Diagnosis present

## 2021-06-13 DIAGNOSIS — K219 Gastro-esophageal reflux disease without esophagitis: Secondary | ICD-10-CM | POA: Diagnosis present

## 2021-06-13 DIAGNOSIS — E871 Hypo-osmolality and hyponatremia: Secondary | ICD-10-CM | POA: Diagnosis not present

## 2021-06-13 DIAGNOSIS — M7989 Other specified soft tissue disorders: Secondary | ICD-10-CM

## 2021-06-13 DIAGNOSIS — I4891 Unspecified atrial fibrillation: Secondary | ICD-10-CM | POA: Diagnosis present

## 2021-06-13 DIAGNOSIS — Z7951 Long term (current) use of inhaled steroids: Secondary | ICD-10-CM

## 2021-06-13 DIAGNOSIS — F54 Psychological and behavioral factors associated with disorders or diseases classified elsewhere: Secondary | ICD-10-CM | POA: Diagnosis present

## 2021-06-13 DIAGNOSIS — Z79899 Other long term (current) drug therapy: Secondary | ICD-10-CM

## 2021-06-13 DIAGNOSIS — Z20822 Contact with and (suspected) exposure to covid-19: Secondary | ICD-10-CM | POA: Diagnosis present

## 2021-06-13 DIAGNOSIS — F845 Asperger's syndrome: Secondary | ICD-10-CM | POA: Diagnosis present

## 2021-06-13 DIAGNOSIS — I10 Essential (primary) hypertension: Secondary | ICD-10-CM | POA: Diagnosis present

## 2021-06-13 LAB — URINALYSIS, COMPLETE (UACMP) WITH MICROSCOPIC
Bacteria, UA: NONE SEEN
Bilirubin Urine: NEGATIVE
Glucose, UA: NEGATIVE mg/dL
Hgb urine dipstick: NEGATIVE
Ketones, ur: NEGATIVE mg/dL
Leukocytes,Ua: NEGATIVE
Nitrite: NEGATIVE
Protein, ur: NEGATIVE mg/dL
Specific Gravity, Urine: 1.001 — ABNORMAL LOW (ref 1.005–1.030)
Squamous Epithelial / HPF: NONE SEEN (ref 0–5)
pH: 8 (ref 5.0–8.0)

## 2021-06-13 LAB — COMPREHENSIVE METABOLIC PANEL
ALT: 24 U/L (ref 0–44)
AST: 23 U/L (ref 15–41)
Albumin: 4.1 g/dL (ref 3.5–5.0)
Alkaline Phosphatase: 54 U/L (ref 38–126)
Anion gap: 9 (ref 5–15)
BUN: 9 mg/dL (ref 8–23)
CO2: 26 mmol/L (ref 22–32)
Calcium: 8.9 mg/dL (ref 8.9–10.3)
Chloride: 90 mmol/L — ABNORMAL LOW (ref 98–111)
Creatinine, Ser: 0.5 mg/dL — ABNORMAL LOW (ref 0.61–1.24)
GFR, Estimated: >60 mL/min (ref >60)
Glucose, Bld: 143 mg/dL — ABNORMAL HIGH (ref 70–99)
Potassium: 4.5 mmol/L (ref 3.5–5.1)
Sodium: 125 mmol/L — ABNORMAL LOW (ref 135–145)
Total Bilirubin: 1.3 mg/dL — ABNORMAL HIGH (ref 0.3–1.2)
Total Protein: 7.2 g/dL (ref 6.5–8.1)

## 2021-06-13 LAB — PHOSPHORUS: Phosphorus: 3.1 mg/dL (ref 2.5–4.6)

## 2021-06-13 LAB — TSH: TSH: 2.221 u[IU]/mL (ref 0.350–4.500)

## 2021-06-13 LAB — CBC WITH DIFFERENTIAL/PLATELET
Abs Immature Granulocytes: 0.01 10*3/uL (ref 0.00–0.07)
Basophils Absolute: 0 10*3/uL (ref 0.0–0.1)
Basophils Relative: 0 %
Eosinophils Absolute: 0.2 10*3/uL (ref 0.0–0.5)
Eosinophils Relative: 2 %
HCT: 37 % — ABNORMAL LOW (ref 39.0–52.0)
Hemoglobin: 12.9 g/dL — ABNORMAL LOW (ref 13.0–17.0)
Immature Granulocytes: 0 %
Lymphocytes Relative: 9 %
Lymphs Abs: 0.6 10*3/uL — ABNORMAL LOW (ref 0.7–4.0)
MCH: 28 pg (ref 26.0–34.0)
MCHC: 34.9 g/dL (ref 30.0–36.0)
MCV: 80.3 fL (ref 80.0–100.0)
Monocytes Absolute: 0.7 10*3/uL (ref 0.1–1.0)
Monocytes Relative: 10 %
Neutro Abs: 5.5 10*3/uL (ref 1.7–7.7)
Neutrophils Relative %: 79 %
Platelets: 217 10*3/uL (ref 150–400)
RBC: 4.61 MIL/uL (ref 4.22–5.81)
RDW: 14.2 % (ref 11.5–15.5)
WBC: 7 10*3/uL (ref 4.0–10.5)
nRBC: 0 % (ref 0.0–0.2)

## 2021-06-13 LAB — RESP PANEL BY RT-PCR (FLU A&B, COVID) ARPGX2
Influenza A by PCR: NEGATIVE
Influenza B by PCR: NEGATIVE
SARS Coronavirus 2 by RT PCR: NEGATIVE

## 2021-06-13 LAB — MAGNESIUM: Magnesium: 2 mg/dL (ref 1.7–2.4)

## 2021-06-13 LAB — CREATININE, URINE, RANDOM: Creatinine, Urine: 12 mg/dL

## 2021-06-13 LAB — BRAIN NATRIURETIC PEPTIDE: B Natriuretic Peptide: 79.3 pg/mL (ref 0.0–100.0)

## 2021-06-13 LAB — OSMOLALITY, URINE: Osmolality, Ur: 122 mOsm/kg — ABNORMAL LOW (ref 300–900)

## 2021-06-13 LAB — OSMOLALITY: Osmolality: 267 mOsm/kg — ABNORMAL LOW (ref 275–295)

## 2021-06-13 LAB — SODIUM, URINE, RANDOM: Sodium, Ur: 29 mmol/L

## 2021-06-13 MED ORDER — ACETAMINOPHEN 325 MG PO TABS
650.0000 mg | ORAL_TABLET | Freq: Four times a day (QID) | ORAL | Status: DC | PRN
  Administered 2021-06-14: 650 mg via ORAL
  Filled 2021-06-13: qty 2

## 2021-06-13 MED ORDER — ACETAMINOPHEN 650 MG RE SUPP: 650.0000 mg | Freq: Four times a day (QID) | RECTAL | Status: DC | PRN

## 2021-06-13 MED ORDER — ONDANSETRON HCL 4 MG/2ML IJ SOLN: 4.0000 mg | Freq: Four times a day (QID) | INTRAMUSCULAR | Status: DC | PRN

## 2021-06-13 MED ORDER — ONDANSETRON HCL 4 MG PO TABS: 4.0000 mg | ORAL_TABLET | Freq: Four times a day (QID) | ORAL | Status: DC | PRN

## 2021-06-13 MED ORDER — ENOXAPARIN SODIUM 80 MG/0.8ML IJ SOSY
0.5000 mg/kg | PREFILLED_SYRINGE | Freq: Every day | INTRAMUSCULAR | Status: DC
  Administered 2021-06-13 – 2021-06-15 (×2): 65 mg via SUBCUTANEOUS
  Filled 2021-06-13 (×4): qty 0.65

## 2021-06-13 NOTE — ED Notes (Signed)
Pt refusing labs at this time. Pt wants IV placed first then labs per Amy RN

## 2021-06-13 NOTE — H&P (Signed)
History and Physical   Carl [name] [id number] DOB: 10-01-1950 DOA: 06/13/2021  PCP: Baxter Hire, MD  [facility]: [facility]  I have personally briefly reviewed patient's old medical records in [place].  Chief Concern: Abnormal labs  HPI: Carl [name] is a 70 y.o. male with medical history significant for persistent hyponatremia, Asperger's syndrome, history of hypertension, history of asthma, presents emergency department for chief concerns of abnormal labs.  He drinks 1 gallon per day. He has many bowel movements, 5 bowel movements that was soft, brown color. He denies blood stool or black stool.   He reports that he has a 8-10 oz glass and every time he drinks water either with meals throughout the day or after he urinates, he would drink approximately 4 to 5 glasses each time.  He further states that his legs cramp up and he feels drinking water helps the cramping.  He further endorses that he did have a lot of cramping prior to his hospitalization in 04/28/2021, however it suddenly resolved.  Social history: He lives by himself.  He denies tobacco, EtOH, recreational drug use.  He is retired and formerly worked in [employer].   Vaccination history: He is vaccinated for covid 19, 4 doses of Pfizer   ROS: Constitutional: no weight change, no fever ENT/Mouth: no sore throat, no rhinorrhea Eyes: no eye pain, no vision changes Cardiovascular: no chest pain, no dyspnea,  no edema, no palpitations Respiratory: no cough, no sputum, no wheezing Gastrointestinal: no nausea, no vomiting, no diarrhea, no constipation Genitourinary: no urinary incontinence, no dysuria, no hematuria Musculoskeletal: no arthralgias, no myalgias Skin: no skin lesions, no pruritus, Neuro: no weakness, no loss of consciousness, no syncope Psych: no anxiety, no depression, no decrease appetite Heme/Lymph: no bruising, no bleeding  ED Course: Discussed with emergency  medicine provider, patient requiring hospitalization for chief concerns of hyponatremia.  Vitals in the emergency department was remarkable for temperature of 97.6, respiration rate of 20, heart rate of 74, blood pressure 174/80, SPO2 of 96% on room air.  Labs in the emergency department was remarkable for sodium level was 126, potassium 4.5, chloride 90, bicarb of 26, BUN of 90, serum creatinine of 0.50, nonfasting blood glucose 143, WBC of 7.0, hemoglobin 12.9, platelets of 217, BNP of 79.3, GFR greater than 60.  Serum osmolality is 267, urine osmolality of 122, urine creatinine of 12, urine sodium is 29, magnesium 2.0, phosphorus 3.1, TSH 2.2,UA was negative for nitrates and leukocytes.  Assessment/Plan  Principal Problem:   Hyponatremia Active Problems:   Asperger's syndrome   Hypertension   Psychogenic polydipsia   # Moderate-severe persistent hyponatremia-etiology work-up in progress, I suspect this is secondary to polydipsia - See labs above - Nephrology consultation has been placed via epic - A.m. team to consult nephrology for further recommendations - No IV fluid, regular room diet with fluid restriction of 750 mL/ 24 h. - Strict I's and O's - Portable chest x-ray ordered to assess for possible mass - Consider CT of the chest with contrast to assess for possible mass if sodium level does not improve with fluid restriction  # Leg cramping versus restless leg syndrome-Requip nightly ordered - Magnesium and phosphorus were within normal limits - Check B12, B1  # Hypertension-resumed atenolol 25 mg p.o. twice daily, lisinopril 10 mg daily  # GERD-PPI  Chart reviewed.   DVT prophylaxis: Enoxaparin Code Status: Full code Diet: Regular diet, with fluid restriction Family Communication: No Disposition  Plan: Pending clinical course Consults called: Nephrology via epic order Admission status: MedSurg, observation, telemetry  Past Medical History:  Diagnosis Date   Anemia     Asperger's syndrome    Asthma    Atrial fibrillation (HCC)    Diverticulosis    GERD (gastroesophageal reflux disease)    History of MRSA infection    Hypertension    Hyponatremia    Psoriasis    Valvular heart disease    Past Surgical History:  Procedure Laterality Date   COLONOSCOPY     COLONOSCOPY WITH PROPOFOL N/A 04/23/2018   Procedure: COLONOSCOPY WITH PROPOFOL;  Surgeon: Christena Deem, MD;  Location: [facility] ENDOSCOPY;  Service: Endoscopy;  Laterality: N/A;   Social History:  reports that he has never smoked. He has never used smokeless tobacco. He reports that he does not currently use alcohol. He reports that he does not use drugs.  No Known Allergies No family history on file. Family history: Family history reviewed and not pertinent  Prior to Admission medications   Medication Sig Start Date End Date Taking? Authorizing Provider  albuterol (VENTOLIN HFA) 108 (90 Base) MCG/ACT inhaler Inhale 2 puffs into the lungs every 6 (six) hours as needed for wheezing. 11/18/18  Yes [provider]  ascorbic acid (VITAMIN C) 1000 MG tablet Take 1,000 mg by mouth daily.   Yes [provider]  atenolol (TENORMIN) 25 MG tablet Take by mouth 2 (two) times daily.    Yes [provider]  cetirizine (ZYRTEC) 10 MG tablet Take 10 mg by mouth daily.   Yes [provider]  fluticasone (FLOVENT HFA) 110 MCG/ACT inhaler Inhale 1 puff into the lungs in the morning and at bedtime. 12/06/20 12/06/21 Yes [provider]  folic acid (FOLVITE) 1 MG tablet Take 1 mg by mouth daily.   Yes [provider]  Garlic 100 MG TABS Take 1 tablet by mouth.   Yes [provider]  hyoscyamine (LEVBID) 0.375 MG 12 hr tablet Take 1 tablet by mouth in the morning and at bedtime. 07/26/20  Yes [provider]  lisinopril (PRINIVIL,ZESTRIL) 10 MG tablet Take 10 mg by mouth daily.   Yes [provider]  montelukast (SINGULAIR) 10 MG tablet  Take 10 mg by mouth at bedtime.   Yes [provider]  mupirocin ointment (BACTROBAN) 2 % Apply 1 application topically as needed.   Yes [provider]  Omega-3 Fatty Acids (SALMON OIL PO) Take 1 capsule by mouth daily.   Yes [provider]  pantoprazole (PROTONIX) 40 MG tablet Take 40 mg by mouth daily.   Yes [provider]  Turmeric (QC TUMERIC COMPLEX PO) Take 1 tablet by mouth daily.   Yes [provider]  Zinc Citrate-Phytase 25-500 MG CAPS Take by mouth.   Yes [provider]   Physical Exam: Vitals:   06/13/21 1839 06/13/21 1857 06/13/21 2026 06/13/21 2336  BP:  (!) 174/88 (!) 156/81 (!) 134/102  Pulse:  74  73  Resp:  20 15 18   Temp:  97.6 F (36.4 C) 97.9 F (36.6 C)   TempSrc:  Oral Oral   SpO2:  96% 99% 100%  Weight: 131.5 kg     Height: 6\' 4"  (1.93 m)      Constitutional: appears age-appropriate, NAD, calm, comfortable Eyes: PERRL, lids and conjunctivae normal ENMT: Mucous membranes are moist. Posterior pharynx clear of any exudate or lesions. Age-appropriate dentition. Hearing appropriate Neck: normal, supple, no masses, no thyromegaly  Respiratory: clear to auscultation bilaterally, no wheezing, no crackles. Normal respiratory effort. No accessory muscle use.  Cardiovascular: Regular rate and rhythm, no murmurs / rubs / gallops. No extremity edema. 2+ pedal pulses. No carotid bruits.  Abdomen: Obese abdomen, no tenderness, no masses palpated, no hepatosplenomegaly. Bowel sounds positive.  Musculoskeletal: no clubbing / cyanosis. No joint deformity upper and lower extremities. Good ROM, no contractures, no atrophy. Normal muscle tone.  Skin: no rashes, lesions, ulcers. No induration.  Bilateral lower extremity skin changes consistent with chronic venous stasis Neurologic: Sensation intact. Strength 5/5 in all 4.  Psychiatric: Normal judgment and insight. Alert and oriented x 3. Normal mood.   EKG: independently  reviewed, showing sinus rhythm with rate of 62, QTc 416  Chest x-ray on Admission: Ordered  US Venous Img Lower Bilateral (DVT)  Result Date: 06/13/2021 CLINICAL DATA:  Bilateral leg swelling EXAM: BILATERAL LOWER EXTREMITY VENOUS DOPPLER ULTRASOUND TECHNIQUE: Gray-scale sonography with compression, as well as color and duplex ultrasound, were performed to evaluate the deep venous system(s) from the level of the common femoral vein through the popliteal and proximal calf veins. COMPARISON:  None. FINDINGS: VENOUS Normal compressibility of the common femoral, superficial femoral, and popliteal veins, as well as the visualized calf veins. Visualized portions of profunda femoral vein and great saphenous vein unremarkable. No filling defects to suggest DVT on grayscale or color Doppler imaging. Doppler waveforms show normal direction of venous flow, normal respiratory plasticity and response to augmentation. OTHER None. Limitations: none IMPRESSION: Negative. Electronically Signed   By: Rolm Baptise M.D.   On: 06/13/2021 23:35    Labs on Admission: I have personally reviewed following labs  CBC: Recent Labs  Lab 06/13/21 2003  WBC 7.0  NEUTROABS 5.5  HGB 12.9*  HCT 37.0*  MCV 80.3  PLT [id number]   Basic Metabolic Panel: Recent Labs  Lab 06/13/21 2003  NA 125*  K 4.5  CL 90*  CO2 26  GLUCOSE 143*  BUN 9  CREATININE 0.50*  CALCIUM 8.9  MG 2.0  PHOS 3.1   GFR: Estimated Creatinine Clearance: 129.1 mL/min (A) (by C-G formula based on SCr of 0.5 mg/dL (L)).  Liver Function Tests: Recent Labs  Lab 06/13/21 2003  AST 23  ALT 24  ALKPHOS 54  BILITOT 1.3*  PROT 7.2  ALBUMIN 4.1   Thyroid Function Tests: Recent Labs    06/13/21 2003  TSH 2.221   Anemia Panel: No results for input(s): VITAMINB12, FOLATE, FERRITIN, TIBC, IRON, RETICCTPCT in the last 72 hours.  Urine analysis:    Component Value Date/Time   COLORURINE COLORLESS (A) 06/13/2021 1852   APPEARANCEUR CLEAR (A)  06/13/2021 1852   LABSPEC 1.001 (L) 06/13/2021 1852   PHURINE 8.0 06/13/2021 1852   GLUCOSEU NEGATIVE 06/13/2021 1852   HGBUR NEGATIVE 06/13/2021 1852   BILIRUBINUR NEGATIVE 06/13/2021 1852   KETONESUR NEGATIVE 06/13/2021 1852   PROTEINUR NEGATIVE 06/13/2021 1852   NITRITE NEGATIVE 06/13/2021 1852   LEUKOCYTESUR NEGATIVE 06/13/2021 1852   Dr. Tobie Poet [facility]  If 7PM-7AM, please contact overnight-coverage provider If 7AM-7PM, please contact day coverage provider [email or web address]  06/14/2021, 12:38 AM

## 2021-06-13 NOTE — ED Provider Notes (Signed)
[facility] Emergency Department Provider Note  ____________________________________________   Event Date/Time   First MD Initiated Contact with Patient 06/13/21 1938     (approximate)  I have reviewed the triage vital signs and the nursing notes.   HISTORY  Chief Complaint Abnormal Lab    HPI Carl [name] is a 70 y.o. male with Asperger's, A. fib, hypertension psychogenic polydipsia who comes in with concerns for hyponatremia.  Patient came in to the [facility] office due to concern for bilateral leg cramping, lightheadedness, fatigue, malaise, over the past several days worsening.  He has tried to increase his salt earlier today by eating poptarts.  Labs checked on 10/20 his sodium was 126.  Today they are at 120.   He needs to drink water after urinating or having BM.              Past Medical History:  Diagnosis Date   Anemia    Asperger's syndrome    Asthma    Atrial fibrillation (HCC)    Diverticulosis    GERD (gastroesophageal reflux disease)    History of MRSA infection    Hypertension    Hyponatremia    Psoriasis    Valvular heart disease     Patient Active Problem List   Diagnosis Date Noted   Acute hyponatremia 04/28/2021   Constipation 04/28/2021   Atrial fibrillation (HCC)    Asperger's syndrome    Hyponatremia    Hypertension    Asthma    Postural dizziness with presyncope    Type 2 diabetes mellitus without complication, without long-term current use of insulin (HCC) 06/23/2018    Past Surgical History:  Procedure Laterality Date   COLONOSCOPY     COLONOSCOPY WITH PROPOFOL N/A 04/23/2018   Procedure: COLONOSCOPY WITH PROPOFOL;  Surgeon: Christena Deem, MD;  Location: [facility] ENDOSCOPY;  Service: Endoscopy;  Laterality: N/A;    Prior to Admission medications   Medication Sig Start Date End Date Taking? Authorizing Provider  albuterol (VENTOLIN HFA) 108 (90 Base) MCG/ACT inhaler Inhale 2 puffs into the lungs  every 6 (six) hours as needed for wheezing. 11/18/18   [provider]  ascorbic acid (VITAMIN C) 1000 MG tablet Take 1,000 mg by mouth daily.    [provider]  atenolol (TENORMIN) 25 MG tablet Take by mouth 2 (two) times daily.     [provider]  cetirizine (ZYRTEC) 10 MG tablet Take 10 mg by mouth daily.    [provider]  fluticasone (FLOVENT HFA) 110 MCG/ACT inhaler Inhale 1 puff into the lungs in the morning and at bedtime. 12/06/20 12/06/21  [provider]  folic acid (FOLVITE) 1 MG tablet Take 1 mg by mouth daily.    [provider]  Garlic 100 MG TABS Take 1 tablet by mouth.    [provider]  hyoscyamine (LEVBID) 0.375 MG 12 hr tablet Take 1 tablet by mouth in the morning and at bedtime. 07/26/20   [provider]  lisinopril (PRINIVIL,ZESTRIL) 10 MG tablet Take 10 mg by mouth daily.    [provider]  montelukast (SINGULAIR) 10 MG tablet Take 10 mg by mouth at bedtime. Patient not taking: Reported on 04/28/2021    [provider]  mupirocin ointment (BACTROBAN) 2 % Apply 1 application topically as needed.    [provider]  Omega-3 Fatty Acids (SALMON OIL PO) Take 1 capsule by mouth daily.    [provider]  pantoprazole (PROTONIX) 40  MG tablet Take 40 mg by mouth daily.    [provider]  Turmeric (QC TUMERIC COMPLEX PO) Take 1 tablet by mouth daily.    [provider]  Zinc Citrate-Phytase 25-500 MG CAPS Take by mouth.    [provider]    Allergies Patient has no known allergies.  No family history on file.  Social History Social History   Tobacco Use   Smoking status: Never   Smokeless tobacco: Never  Vaping Use   Vaping Use: Never used  Substance Use Topics   Alcohol use: Not Currently    Comment: rarely   Drug use: Never      Review of Systems Constitutional: No fever/chills, swimmy head Eyes: No visual changes. ENT: No  sore throat. Cardiovascular: Denies chest pain. Respiratory: Denies shortness of breath. Gastrointestinal: No abdominal pain.  No nausea, no vomiting.  No diarrhea.  No constipation. Genitourinary: Negative for dysuria. Musculoskeletal: Negative for back pain. Leg cramping  Skin: Negative for rash. Neurological: Negative for headaches, focal weakness or numbness. All other ROS negative ____________________________________________   PHYSICAL EXAM:  VITAL SIGNS: ED Triage Vitals  Enc Vitals Group     BP 06/13/21 1857 (!) 174/88     Pulse Rate 06/13/21 1857 74     Resp 06/13/21 1857 20     Temp 06/13/21 1857 97.6 F (36.4 C)     Temp Source 06/13/21 1857 Oral     SpO2 06/13/21 1857 96 %     Weight 06/13/21 1839 290 lb (131.5 kg)     Height 06/13/21 1839 6\' 4"  (1.93 m)     Head Circumference --      Peak Flow --      Pain Score --      Pain Loc --      Pain Edu? --      Excl. in GC? --     Constitutional: Alert and oriented. Well appearing and in no acute distress. Eyes: Conjunctivae are normal. EOMI. Head: Atraumatic. Nose: No congestion/rhinnorhea. Mouth/Throat: Mucous membranes are moist.   Neck: No stridor. Trachea Midline. FROM Cardiovascular: Normal rate, regular rhythm. Grossly normal heart sounds.  Good peripheral circulation. Respiratory: Normal respiratory effort.  No retractions. Lungs CTAB. Gastrointestinal: Soft and nontender. No distention. No abdominal bruits.  Musculoskeletal:  1+ edema, some chronic venous stasis bilaterally no joint effusions. Neurologic:  Normal speech and language. No gross focal neurologic deficits are appreciated.  Skin:  Skin is warm, dry and intact. No rash noted. Psychiatric: Mood and affect are normal. Speech and behavior are normal. GU: Deferred   ____________________________________________   LABS (all labs ordered are listed, but only abnormal results are displayed)  Labs Reviewed  URINALYSIS, COMPLETE (UACMP) WITH  MICROSCOPIC - Abnormal; Notable for the following components:      Result Value   Color, Urine COLORLESS (*)    APPearance CLEAR (*)    Specific Gravity, Urine 1.001 (*)    All other components within normal limits   ____________________________________________   ED ECG REPORT I, , the attending physician, personally viewed and interpreted this ECG.  Normal sinus rate of 62, no ST elevation, no T wave inversions except for lead III with a type I AV block ____________________________________________  RADIOLOGY   Official radiology report(s): Concha Se Venous Img Lower Bilateral (DVT)  Result Date: 06/13/2021 CLINICAL DATA:  Bilateral leg swelling EXAM: BILATERAL LOWER EXTREMITY VENOUS DOPPLER ULTRASOUND TECHNIQUE: Gray-scale sonography with compression, as well as color and duplex  ultrasound, were performed to evaluate the deep venous system(s) from the level of the common femoral vein through the popliteal and proximal calf veins. COMPARISON:  None. FINDINGS: VENOUS Normal compressibility of the common femoral, superficial femoral, and popliteal veins, as well as the visualized calf veins. Visualized portions of profunda femoral vein and great saphenous vein unremarkable. No filling defects to suggest DVT on grayscale or color Doppler imaging. Doppler waveforms show normal direction of venous flow, normal respiratory plasticity and response to augmentation. OTHER None. Limitations: none IMPRESSION: Negative. Electronically Signed   By: Charlett Nose M.D.   On: 06/13/2021 23:35    ____________________________________________   PROCEDURES  Procedure(s) performed (including Critical Care):  Procedures   ____________________________________________   INITIAL IMPRESSION / ASSESSMENT AND PLAN / ED COURSE  Carl [name] was evaluated in Emergency Department on 06/13/2021 for the symptoms described in the history of present illness. He was evaluated in the context of the  global COVID-19 pandemic, which necessitated consideration that the patient might be at risk for infection with the SARS-CoV-2 virus that causes COVID-19. Institutional protocols and algorithms that pertain to the evaluation of patients at risk for COVID-19 are in a state of rapid change based on information released by regulatory bodies including the CDC and federal and state organizations. These policies and algorithms were followed during the patient's care in the ED.    Patient comes in with concerns for leg cramping.  Patient was notably hyponatremic.  Labs ordered to try to figure out what is the cause of this.  Could be polydipsia but based on all exams it could also be CHF.  We will get ultrasounds to evaluate for DVTs as well.  Sodium is coming up slightly but given continued symptoms we will discussed the hospital team for admission       ____________________________________________   FINAL CLINICAL IMPRESSION(S) / ED DIAGNOSES   Final diagnoses:  Leg swelling  Hyponatremia      MEDICATIONS GIVEN DURING THIS VISIT:  Medications  acetaminophen (TYLENOL) tablet 650 mg (has no administration in time range)    Or  acetaminophen (TYLENOL) suppository 650 mg (has no administration in time range)  ondansetron (ZOFRAN) tablet 4 mg (has no administration in time range)    Or  ondansetron (ZOFRAN) injection 4 mg (has no administration in time range)  enoxaparin (LOVENOX) injection 65 mg (65 mg Subcutaneous Given 06/13/21 2333)     ED Discharge Orders     None        Note:  This document was prepared using Dragon voice recognition software and may include unintentional dictation errors.    Concha Se, MD 06/13/21 2350

## 2021-06-13 NOTE — ED Triage Notes (Addendum)
Pt sent from [facility] for eval of low sodium.  Pt denies n/v/d.  No confusion  pt alert  speech clear.   Pt has lab work with him.  Done 1 hour ago at [employer].

## 2021-06-13 NOTE — ED Notes (Signed)
[facility] tech at bedside at this time  

## 2021-06-13 NOTE — Progress Notes (Signed)
PHARMACIST - PHYSICIAN COMMUNICATION  CONCERNING:  Enoxaparin (Lovenox) for DVT Prophylaxis    RECOMMENDATION: Patient was prescribed enoxaparin 40mg  q24 hours for VTE prophylaxis.   Filed Weights   06/13/21 1839  Weight: 131.5 kg (290 lb)    Body mass index is 35.3 kg/m.  Estimated Creatinine Clearance: 129.1 mL/min (A) (by C-G formula based on SCr of 0.5 mg/dL (L)).   Based on [facility] policy patient is candidate for enoxaparin 0.5mg /kg TBW SQ every 24 hours based on BMI being >30.  DESCRIPTION: Pharmacy has adjusted enoxaparin dose per [facility] policy.  Patient is now receiving enoxaparin 65 mg every 24 hours    [facility] 06/13/2021 10:11 PM

## 2021-06-14 ENCOUNTER — Encounter: Payer: Self-pay | Admitting: Internal Medicine

## 2021-06-14 ENCOUNTER — Observation Stay: Payer: Medicare Other

## 2021-06-14 DIAGNOSIS — L409 Psoriasis, unspecified: Secondary | ICD-10-CM | POA: Diagnosis present

## 2021-06-14 DIAGNOSIS — I4891 Unspecified atrial fibrillation: Secondary | ICD-10-CM | POA: Diagnosis present

## 2021-06-14 DIAGNOSIS — K219 Gastro-esophageal reflux disease without esophagitis: Secondary | ICD-10-CM | POA: Diagnosis present

## 2021-06-14 DIAGNOSIS — I10 Essential (primary) hypertension: Secondary | ICD-10-CM | POA: Diagnosis present

## 2021-06-14 DIAGNOSIS — Z20822 Contact with and (suspected) exposure to covid-19: Secondary | ICD-10-CM | POA: Diagnosis present

## 2021-06-14 DIAGNOSIS — E871 Hypo-osmolality and hyponatremia: Secondary | ICD-10-CM | POA: Diagnosis present

## 2021-06-14 DIAGNOSIS — Z8614 Personal history of Methicillin resistant Staphylococcus aureus infection: Secondary | ICD-10-CM | POA: Diagnosis not present

## 2021-06-14 DIAGNOSIS — R631 Polydipsia: Secondary | ICD-10-CM | POA: Diagnosis present

## 2021-06-14 DIAGNOSIS — R600 Localized edema: Secondary | ICD-10-CM | POA: Diagnosis present

## 2021-06-14 DIAGNOSIS — Z79899 Other long term (current) drug therapy: Secondary | ICD-10-CM | POA: Diagnosis not present

## 2021-06-14 DIAGNOSIS — J45909 Unspecified asthma, uncomplicated: Secondary | ICD-10-CM | POA: Diagnosis present

## 2021-06-14 DIAGNOSIS — E119 Type 2 diabetes mellitus without complications: Secondary | ICD-10-CM | POA: Diagnosis present

## 2021-06-14 DIAGNOSIS — F845 Asperger's syndrome: Secondary | ICD-10-CM | POA: Diagnosis present

## 2021-06-14 DIAGNOSIS — G2581 Restless legs syndrome: Secondary | ICD-10-CM | POA: Diagnosis present

## 2021-06-14 DIAGNOSIS — Z7951 Long term (current) use of inhaled steroids: Secondary | ICD-10-CM | POA: Diagnosis not present

## 2021-06-14 LAB — BASIC METABOLIC PANEL
Anion gap: 7 (ref 5–15)
BUN: 10 mg/dL (ref 8–23)
CO2: 26 mmol/L (ref 22–32)
Calcium: 8.7 mg/dL — ABNORMAL LOW (ref 8.9–10.3)
Chloride: 95 mmol/L — ABNORMAL LOW (ref 98–111)
Creatinine, Ser: 0.64 mg/dL (ref 0.61–1.24)
GFR, Estimated: >60 mL/min (ref >60)
Glucose, Bld: 141 mg/dL — ABNORMAL HIGH (ref 70–99)
Potassium: 4.3 mmol/L (ref 3.5–5.1)
Sodium: 128 mmol/L — ABNORMAL LOW (ref 135–145)

## 2021-06-14 LAB — CBC
HCT: 36.5 % — ABNORMAL LOW (ref 39.0–52.0)
Hemoglobin: 12.5 g/dL — ABNORMAL LOW (ref 13.0–17.0)
MCH: 27.6 pg (ref 26.0–34.0)
MCHC: 34.2 g/dL (ref 30.0–36.0)
MCV: 80.6 fL (ref 80.0–100.0)
Platelets: 205 10*3/uL (ref 150–400)
RBC: 4.53 MIL/uL (ref 4.22–5.81)
RDW: 14.6 % (ref 11.5–15.5)
WBC: 6.5 10*3/uL (ref 4.0–10.5)
nRBC: 0 % (ref 0.0–0.2)

## 2021-06-14 LAB — VITAMIN B12: Vitamin B-12: 693 pg/mL (ref 180–914)

## 2021-06-14 LAB — VITAMIN D 25 HYDROXY (VIT D DEFICIENCY, FRACTURES): Vit D, 25-Hydroxy: 41.96 ng/mL (ref 30–100)

## 2021-06-14 LAB — SODIUM: Sodium: 128 mmol/L — ABNORMAL LOW (ref 135–145)

## 2021-06-14 MED ORDER — CYCLOBENZAPRINE HCL 10 MG PO TABS
5.0000 mg | ORAL_TABLET | Freq: Three times a day (TID) | ORAL | Status: DC | PRN
  Administered 2021-06-14: 5 mg via ORAL
  Filled 2021-06-14: qty 1

## 2021-06-14 MED ORDER — ALBUTEROL SULFATE (2.5 MG/3ML) 0.083% IN NEBU: 2.5000 mg | INHALATION_SOLUTION | Freq: Four times a day (QID) | RESPIRATORY_TRACT | Status: DC | PRN

## 2021-06-14 MED ORDER — FAMOTIDINE 20 MG PO TABS: 20.0000 mg | ORAL_TABLET | Freq: Two times a day (BID) | ORAL | Status: DC

## 2021-06-14 MED ORDER — MONTELUKAST SODIUM 10 MG PO TABS
10.0000 mg | ORAL_TABLET | Freq: Every day | ORAL | Status: DC
  Administered 2021-06-15: 10 mg via ORAL
  Filled 2021-06-14: qty 1

## 2021-06-14 MED ORDER — ATENOLOL 25 MG PO TABS
25.0000 mg | ORAL_TABLET | Freq: Two times a day (BID) | ORAL | Status: DC
  Administered 2021-06-14 – 2021-06-15 (×3): 25 mg via ORAL
  Filled 2021-06-14 (×4): qty 1

## 2021-06-14 MED ORDER — ALBUTEROL SULFATE HFA 108 (90 BASE) MCG/ACT IN AERS: 2.0000 | INHALATION_SPRAY | Freq: Four times a day (QID) | RESPIRATORY_TRACT | Status: DC | PRN

## 2021-06-14 MED ORDER — ALBUTEROL SULFATE (2.5 MG/3ML) 0.083% IN NEBU: 2.5000 mg | INHALATION_SOLUTION | RESPIRATORY_TRACT | Status: DC | PRN

## 2021-06-14 MED ORDER — BOOST / RESOURCE BREEZE PO LIQD CUSTOM
1.0000 | ORAL | Status: DC
  Administered 2021-06-15: 1 via ORAL

## 2021-06-14 MED ORDER — PNEUMOCOCCAL VAC POLYVALENT 25 MCG/0.5ML IJ INJ
0.5000 mL | INJECTION | INTRAMUSCULAR | Status: DC
  Filled 2021-06-14: qty 0.5

## 2021-06-14 MED ORDER — LISINOPRIL 10 MG PO TABS
10.0000 mg | ORAL_TABLET | Freq: Every day | ORAL | Status: DC
  Administered 2021-06-14 – 2021-06-15 (×2): 10 mg via ORAL
  Filled 2021-06-14 (×2): qty 1

## 2021-06-14 MED ORDER — PANTOPRAZOLE SODIUM 40 MG PO TBEC
40.0000 mg | DELAYED_RELEASE_TABLET | Freq: Every day | ORAL | Status: DC
  Administered 2021-06-14 – 2021-06-15 (×2): 40 mg via ORAL
  Filled 2021-06-14 (×2): qty 1

## 2021-06-14 MED ORDER — HYOSCYAMINE SULFATE ER 0.375 MG PO TB12
0.3750 mg | ORAL_TABLET | Freq: Two times a day (BID) | ORAL | Status: DC
  Administered 2021-06-14 – 2021-06-15 (×2): 0.375 mg via ORAL
  Filled 2021-06-14 (×5): qty 1

## 2021-06-14 MED ORDER — ROPINIROLE HCL 1 MG PO TABS
2.0000 mg | ORAL_TABLET | Freq: Every day | ORAL | Status: AC
  Administered 2021-06-14 – 2021-06-15 (×2): 2 mg via ORAL
  Filled 2021-06-14 (×3): qty 2

## 2021-06-14 NOTE — Progress Notes (Signed)
PROGRESS NOTE    Kentavious Michele  EOF:[id number] DOB: 08-01-1951 DOA: 06/13/2021 PCP: Gracelyn Nurse, MD   Chief Complain: Abnormal labs  Brief Narrative: Patient is a 70 year old male with history of chronic  hyponatremia, Asperger's syndrome, hypertension, asthma who presents to the emergency department after he was referred for evaluation of abnormal labs.  He reports drinking more than a gallon of water a day and has craving for drinking water.  On presentation sodium level was 115.  Nephrology also consulted and following.  Suspected to be from secondary polydipsia.  Sodium level has improved but still not optimal.  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  Low urine osmolality, low plasma osmolality.  Urine sodium is normal.  Presented with sodium of 115.  Started on fluid restriction.  Sodium level has already improved to 128.  Nephrology also consulted and following Chest x-ray did not show any lung mass.  Low suspicion for SIADH. TSH normal Continue to monitor sodium level . Patient has been counseled to limit oral intake of water.  He attributes drinking more water after he was started on lisinopril and atenolol by his cardiologist. Extensive counseling done at the bedside.  We recommended to try ice chips/frozen grapes, protein rich drink. We will check sodium level tomorrow  History of leg cramps/restless leg syndrome: Requip has been ordered.  Pending vitamin B12, B1.Also ordered muscle relaxants  History of hypertension: On atenolol ,lisinopril at home.  Currently both stable  GERD: On PPI            DVT prophylaxis: Lovenox Code Status: Full code Family Communication: None at the bedside Status is: Observation  The patient remains  OBS appropriate and will d/c before 2 midnights.    Consultants: Nephrology  Procedures:None  Antimicrobials:  Anti-infectives (From admission, onward)    None       Subjective:  Patient seen and examined the bedside this morning.  Hemodynamically stable.  Sitting on the bed.  Denies any new complaints.  Eating his breakfast  Objective: Vitals:   06/13/21 1857 06/13/21 2026 06/13/21 2336 06/14/21 0548  BP: (!) 174/88 (!) 156/81 (!) 134/102 (!) 102/49  Pulse: 74  73   Resp: 20 15 18 12   Temp: 97.6 F (36.4 C) 97.9 F (36.6 C)  97.9 F (36.6 C)  TempSrc: Oral Oral    SpO2: 96% 99% 100% 95%  Weight:      Height:        Intake/Output Summary (Last 24 hours) at 06/14/2021 0834 Last data filed at 06/14/2021 13/11/2020 Gross per 24 hour  Intake 50 ml  Output 3200 ml  Net -3150 ml   Filed Weights   06/13/21 1839  Weight: 131.5 kg    Examination:  General exam: Overall comfortable, not in distress,obese HEENT: PERRL Respiratory system:  no wheezes or crackles  Cardiovascular system: S1 & S2 heard, RRR.  Gastrointestinal system: Abdomen is nondistended, soft and nontender. Central nervous system: Alert and oriented Extremities: No edema, no clubbing ,no cyanosis Skin: Chronic bilateral lower extremities venous stasis changes, no ulcers,no icterus      Data Reviewed: I have personally reviewed following labs and imaging studies  CBC: Recent Labs  Lab 06/13/21 2003 06/14/21 0717  WBC 7.0 6.5  NEUTROABS 5.5  --  HGB 12.9* 12.5*  HCT 37.0* 36.5*  MCV 80.3 80.6  PLT 217 205   Basic Metabolic Panel: Recent Labs  Lab 06/13/21 2003 06/14/21 0717  NA 125* 128*  K 4.5 4.3  CL 90* 95*  CO2 26 26  GLUCOSE 143* 141*  BUN 9 10  CREATININE 0.50* 0.64  CALCIUM 8.9 8.7*  MG 2.0  --   PHOS 3.1  --    GFR: Estimated Creatinine Clearance: 129.1 mL/min (by C-G formula based on SCr of 0.64 mg/dL). Liver Function Tests: Recent Labs  Lab 06/13/21 2003  AST 23   ALT 24  ALKPHOS 54  BILITOT 1.3*  PROT 7.2  ALBUMIN 4.1   No results for input(s): LIPASE, AMYLASE in the last 168 hours. No results for input(s): AMMONIA in the last 168 hours. Coagulation Profile: No results for input(s): INR, PROTIME in the last 168 hours. Cardiac Enzymes: No results for input(s): CKTOTAL, CKMB, CKMBINDEX, TROPONINI in the last 168 hours. BNP (last 3 results) No results for input(s): PROBNP in the last 8760 hours. HbA1C: No results for input(s): HGBA1C in the last 72 hours. CBG: No results for input(s): GLUCAP in the last 168 hours. Lipid Profile: No results for input(s): CHOL, HDL, LDLCALC, TRIG, CHOLHDL, LDLDIRECT in the last 72 hours. Thyroid Function Tests: Recent Labs    06/13/21 2003  TSH 2.221   Anemia Panel: No results for input(s): VITAMINB12, FOLATE, FERRITIN, TIBC, IRON, RETICCTPCT in the last 72 hours. Sepsis Labs: No results for input(s): PROCALCITON, LATICACIDVEN in the last 168 hours.  Recent Results (from the past 240 hour(s))  Resp Panel by RT-PCR (Flu A&B, Covid) Nasopharyngeal Swab     Status: None   Collection Time: 06/13/21  8:03 PM   Specimen: Nasopharyngeal Swab; Nasopharyngeal(NP) swabs in vial transport medium  Result Value Ref Range Status   SARS Coronavirus 2 by RT PCR NEGATIVE NEGATIVE Final    Comment: (NOTE) SARS-CoV-2 target nucleic acids are NOT DETECTED.  The SARS-CoV-2 RNA is generally detectable in upper respiratory specimens during the acute phase of infection. The lowest concentration of SARS-CoV-2 viral copies this assay can detect is 138 copies/mL. A negative result does not preclude SARS-Cov-2 infection and should not be used as the sole basis for treatment or other patient management decisions. A negative result may occur with  improper specimen collection/handling, submission of specimen other than nasopharyngeal swab, presence of viral mutation(s) within the areas targeted by this assay, and inadequate  number of viral copies(<138 copies/mL). A negative result must be combined with clinical observations, patient history, and epidemiological information. The expected result is Negative.  Fact Sheet for Patients:  BloggerCourse.com  Fact Sheet for Healthcare Providers:  SeriousBroker.it  This test is no t yet approved or cleared by the [place] FDA and  has been authorized for detection and/or diagnosis of SARS-CoV-2 by FDA under an Emergency Use Authorization (EUA). This EUA will remain  in effect (meaning this test can be used) for the duration of the COVID-19 declaration under Section 564(b)(1) of the Act, 21 U.S.C.section 360bbb-3(b)(1), unless the authorization is terminated  or revoked sooner.       Influenza A by PCR NEGATIVE NEGATIVE Final   Influenza B by PCR NEGATIVE NEGATIVE Final    Comment: (NOTE) The Xpert Xpress SARS-CoV-2/FLU/RSV plus assay is intended as an aid in the diagnosis of influenza from Nasopharyngeal swab specimens and should not be used as a sole basis for treatment. Nasal washings and aspirates are unacceptable for  Xpert Xpress SARS-CoV-2/FLU/RSV testing.  Fact Sheet for Patients: BloggerCourse.com  Fact Sheet for Healthcare Providers: SeriousBroker.it  This test is not yet approved or cleared by the [place] FDA and has been authorized for detection and/or diagnosis of SARS-CoV-2 by FDA under an Emergency Use Authorization (EUA). This EUA will remain in effect (meaning this test can be used) for the duration of the COVID-19 declaration under Section 564(b)(1) of the Act, 21 U.S.C. section 360bbb-3(b)(1), unless the authorization is terminated or revoked.  Performed at [facility], [address] [place], [place] [address]          Radiology Studies: US Venous Img Lower Bilateral (DVT)  Result Date:  06/13/2021 CLINICAL DATA:  Bilateral leg swelling EXAM: BILATERAL LOWER EXTREMITY VENOUS DOPPLER ULTRASOUND TECHNIQUE: Gray-scale sonography with compression, as well as color and duplex ultrasound, were performed to evaluate the deep venous system(s) from the level of the common femoral vein through the popliteal and proximal calf veins. COMPARISON:  None. FINDINGS: VENOUS Normal compressibility of the common femoral, superficial femoral, and popliteal veins, as well as the visualized calf veins. Visualized portions of profunda femoral vein and great saphenous vein unremarkable. No filling defects to suggest DVT on grayscale or color Doppler imaging. Doppler waveforms show normal direction of venous flow, normal respiratory plasticity and response to augmentation. OTHER None. Limitations: none IMPRESSION: Negative. Electronically Signed   By: Charlett Nose M.D.   On: 06/13/2021 23:35   DG Chest Port 1 View  Result Date: 06/14/2021 CLINICAL DATA:  Hyponatremia EXAM: PORTABLE CHEST 1 VIEW COMPARISON:  None. FINDINGS: The heart size and mediastinal contours are within normal limits. Both lungs are clear. The visualized skeletal structures are unremarkable. IMPRESSION: No active disease. Electronically Signed   By: Deatra Robinson M.D.   On: 06/14/2021 01:10        Scheduled Meds:  atenolol  25 mg Oral BID   enoxaparin (LOVENOX) injection  0.5 mg/kg Subcutaneous Q2200   hyoscyamine  0.375 mg Oral BID   lisinopril  10 mg Oral Daily   montelukast  10 mg Oral QHS   pantoprazole  40 mg Oral Daily   rOPINIRole  2 mg Oral QHS   Continuous Infusions:   LOS: 0 days    Time spent:35 mins. More than 50% of that time was spent in counseling and/or coordination of care.      Burnadette Pop, MD [facility] P11/11/2020, 8:34 AM

## 2021-06-14 NOTE — ED Notes (Signed)
Pt states he would like to go home. Pt educated on plan of care and reason for delay.

## 2021-06-14 NOTE — Consult Note (Signed)
[facility] Consult Note:06/14/2021    Date of Admission:  06/13/2021           Reason for Consult: hyponatremia    Referring Provider: Shelly Coss, MD Primary Care Provider: Baxter Hire, MD   History of Presenting Illness:  Carl [name] is a 70 y.o. male  Patient sent from [facility] acute care for low sodium.  He has medical problems of A Fib, Aspergers syndrome, HTN, Psychogenic polydipsia. Initial Na at presentation was 125 Trended higher to 128 today. Admission for similar problem in september Patient sitting up on the side of the bed getting ready to eat his lunch when seen.  He insists that his stomach has this need for fluid intake.  He drinks a total of up to 1 and half gallon of fluid per day.  He thinks that the symptoms started after some medication changes by cardiologist.  He has lower extremity edema.  Patient lives by himself Able to walk to the bus stop and then takes the bus for grocery shopping etc.   Review of Systems: ROS Gen: Denies any fevers or chills HEENT: No vision or hearing problems CV: No chest pain or shortness of breath Resp: No cough or sputum production GI: No nausea, vomiting or diarrhea.  No blood in the stool.  Reports upper mid epigastric discomfort which is relieved by drinking water GU : No problems with voiding.  No hematuria.   MS: Ambulatory at home.  Denies any acute joint pain or swelling Derm:   No complaints Psych: No complaints Heme: No complaints Neuro: No complaints Endocrine: No complaints   Past Medical History:  Diagnosis Date   Anemia    Asperger's syndrome    Asthma    Atrial fibrillation (HCC)    Diverticulosis    GERD (gastroesophageal reflux disease)    History of MRSA infection    Hypertension    Hyponatremia    Psoriasis    Valvular heart disease     Social History   Tobacco Use   Smoking status: Never   Smokeless tobacco: Never  Vaping Use   Vaping Use: Never used   Substance Use Topics   Alcohol use: Not Currently    Comment: rarely   Drug use: Never    No family history on file.   OBJECTIVE: Blood pressure (!) 102/49, pulse 73, temperature 97.9 F (36.6 C), resp. rate 12, height 6\' 4"  (1.93 m), weight 131.5 kg, SpO2 95 %.  Physical Exam Physical Exam: General:  No acute distress, sitting up on the side of bed  HEENT  anicteric, moist oral mucous membrane  Pulm/lungs  normal breathing effort, lungs are clear to auscultation  CVS/Heart  regular rhythm, no rub or gallop  Abdomen:   Soft, nontender  Extremities:  2+ peripheral edema with chronic congestive changes  Neurologic:  Alert, oriented, able to follow commands  Skin:  No acute rashes     Lab Results Lab Results  Component Value Date   WBC 6.5 06/14/2021   HGB 12.5 (L) 06/14/2021   HCT 36.5 (L) 06/14/2021   MCV 80.6 06/14/2021   PLT 205 06/14/2021    Lab Results  Component Value Date   CREATININE 0.64 06/14/2021   BUN 10 06/14/2021   NA 128 (L) 06/14/2021   K 4.3 06/14/2021   CL 95 (L) 06/14/2021   CO2 26 06/14/2021    Lab Results  Component Value Date   ALT 24 06/13/2021   AST  23 06/13/2021   ALKPHOS 54 06/13/2021   BILITOT 1.3 (H) 06/13/2021     Microbiology: Recent Results (from the past 240 hour(s))  Resp Panel by RT-PCR (Flu A&B, Covid) Nasopharyngeal Swab     Status: None   Collection Time: 06/13/21  8:03 PM   Specimen: Nasopharyngeal Swab; Nasopharyngeal(NP) swabs in vial transport medium  Result Value Ref Range Status   SARS Coronavirus 2 by RT PCR NEGATIVE NEGATIVE Final    Comment: (NOTE) SARS-CoV-2 target nucleic acids are NOT DETECTED.  The SARS-CoV-2 RNA is generally detectable in upper respiratory specimens during the acute phase of infection. The lowest concentration of SARS-CoV-2 viral copies this assay can detect is 138 copies/mL. A negative result does not preclude SARS-Cov-2 infection and should not be used as the sole basis for  treatment or other patient management decisions. A negative result may occur with  improper specimen collection/handling, submission of specimen other than nasopharyngeal swab, presence of viral mutation(s) within the areas targeted by this assay, and inadequate number of viral copies(<138 copies/mL). A negative result must be combined with clinical observations, patient history, and epidemiological information. The expected result is Negative.  Fact Sheet for Patients:  BloggerCourse.com  Fact Sheet for Healthcare Providers:  SeriousBroker.it  This test is no t yet approved or cleared by the [place] FDA and  has been authorized for detection and/or diagnosis of SARS-CoV-2 by FDA under an Emergency Use Authorization (EUA). This EUA will remain  in effect (meaning this test can be used) for the duration of the COVID-19 declaration under Section 564(b)(1) of the Act, 21 U.S.C.section 360bbb-3(b)(1), unless the authorization is terminated  or revoked sooner.       Influenza A by PCR NEGATIVE NEGATIVE Final   Influenza B by PCR NEGATIVE NEGATIVE Final    Comment: (NOTE) The Xpert Xpress SARS-CoV-2/FLU/RSV plus assay is intended as an aid in the diagnosis of influenza from Nasopharyngeal swab specimens and should not be used as a sole basis for treatment. Nasal washings and aspirates are unacceptable for Xpert Xpress SARS-CoV-2/FLU/RSV testing.  Fact Sheet for Patients: BloggerCourse.com  Fact Sheet for Healthcare Providers: SeriousBroker.it  This test is not yet approved or cleared by the [place] FDA and has been authorized for detection and/or diagnosis of SARS-CoV-2 by FDA under an Emergency Use Authorization (EUA). This EUA will remain in effect (meaning this test can be used) for the duration of the COVID-19 declaration under Section 564(b)(1) of the Act, 21  U.S.C. section 360bbb-3(b)(1), unless the authorization is terminated or revoked.  Performed at [facility], [address] [place], [place] [address]     Medications: Scheduled Meds:  atenolol  25 mg Oral BID   enoxaparin (LOVENOX) injection  0.5 mg/kg Subcutaneous Q2200   hyoscyamine  0.375 mg Oral BID   lisinopril  10 mg Oral Daily   montelukast  10 mg Oral QHS   pantoprazole  40 mg Oral Daily   rOPINIRole  2 mg Oral QHS   Continuous Infusions: PRN Meds:.acetaminophen **OR** acetaminophen, albuterol, ondansetron **OR** ondansetron (ZOFRAN) IV  No Known Allergies  Urinalysis: Recent Labs    06/13/21 1852  COLORURINE COLORLESS*  LABSPEC 1.001*  PHURINE 8.0  GLUCOSEU NEGATIVE  HGBUR NEGATIVE  BILIRUBINUR NEGATIVE  KETONESUR NEGATIVE  PROTEINUR NEGATIVE  NITRITE NEGATIVE  LEUKOCYTESUR NEGATIVE      Imaging: US Venous Img Lower Bilateral (DVT)  Result Date: 06/13/2021 CLINICAL DATA:  Bilateral leg swelling EXAM: BILATERAL LOWER EXTREMITY VENOUS DOPPLER ULTRASOUND  TECHNIQUE: Gray-scale sonography with compression, as well as color and duplex ultrasound, were performed to evaluate the deep venous system(s) from the level of the common femoral vein through the popliteal and proximal calf veins. COMPARISON:  None. FINDINGS: VENOUS Normal compressibility of the common femoral, superficial femoral, and popliteal veins, as well as the visualized calf veins. Visualized portions of profunda femoral vein and great saphenous vein unremarkable. No filling defects to suggest DVT on grayscale or color Doppler imaging. Doppler waveforms show normal direction of venous flow, normal respiratory plasticity and response to augmentation. OTHER None. Limitations: none IMPRESSION: Negative. Electronically Signed   By: Rolm Baptise M.D.   On: 06/13/2021 23:35   DG Chest Port 1 View  Result Date: 06/14/2021 CLINICAL DATA:  Hyponatremia EXAM: PORTABLE CHEST 1 VIEW COMPARISON:  None.  FINDINGS: The heart size and mediastinal contours are within normal limits. Both lungs are clear. The visualized skeletal structures are unremarkable. IMPRESSION: No active disease. Electronically Signed   By: Ulyses Jarred M.D.   On: 06/14/2021 01:10      Assessment/Plan:  Carl [name] is a 70 y.o. male with medical problems of   A Fib, Aspergers syndrome, HTN, Psychogenic polydipsia., diabetes  was admitted on 06/13/2021 for :  Hyponatremia [E87.1]  # hyponatremia Previous work up includes normal TSH on 11/3 U/a (11/3): sp Gravity: 1.001; neg protein, blood Creatinine is low normal  Hyponatremia likely secondary to excess water intake due to polydipsia.  Plan: Agree with fluid restriction  Protein supplements Will obtain SPEP, k/l ratio Discussed with patient that it is imperative that he follows fluid restriction.  Patient is adamant about this discomfort in the stomach that is relieved with drinking water.  Discussed alternative strategies such as ice chips/frozen grapes to quench thirst or discomfort while avoiding drinking too much fluids.   Carl [name] Candiss Norse 06/14/21

## 2021-06-14 NOTE — ED Notes (Signed)
Informed pt he cannot be discharged between 7pm and 7am- pt verbalizes understanding. Recliner provided to pt.

## 2021-06-14 NOTE — ED Notes (Signed)
Pt sitting on the side of the bed, complains of bilateral leg cramping and is requesting for muscle relaxer. Dr. Renford Dills was notified. Breakfast tray provided.

## 2021-06-14 NOTE — ED Notes (Signed)
Pt insisting his sodium be rechecked at 7:30, and "if it's 130-133, I can be discharged by 9:30" despite education on plan of care. MD notified of pt's wishes.

## 2021-06-14 NOTE — ED Notes (Signed)
Pt requesting PRN muscle relaxer, Adhikari MD notified. Awaiting response.

## 2021-06-14 NOTE — ED Notes (Signed)
Pt sleeping at this time.

## 2021-06-14 NOTE — ED Notes (Signed)
Pt transferred to hospital bed . No distress noted

## 2021-06-14 NOTE — ED Notes (Signed)
pt co LLE cramping and is requesting muscle relaxer for pain. Pt wants this nurse to express urgency. Pt endorsing that they endorsed this earlier and wasn't "taken seriously". Pt also want singulair. Md messaged securely via messaging system.

## 2021-06-15 LAB — SODIUM: Sodium: 130 mmol/L — ABNORMAL LOW (ref 135–145)

## 2021-06-15 MED ORDER — CYCLOBENZAPRINE HCL 5 MG PO TABS
5.0000 mg | ORAL_TABLET | Freq: Three times a day (TID) | ORAL | 0 refills | Status: DC | PRN
Start: 2021-06-15 — End: 2021-10-15

## 2021-06-15 NOTE — Progress Notes (Signed)
Carl [name]  MRN: [id number]  DOB/AGE: May 11, 1951 70 y.o.  Primary Care Physician:Johnston, Beulah Gandy, MD  Admit date: 06/13/2021  Chief Complaint:  Chief Complaint  Patient presents with   Abnormal Lab    S-Pt presented on  06/13/2021 with  Chief Complaint  Patient presents with   Abnormal Lab  . Patient was seen today on first floor Patient offers no new specific physical complaint. Patient main concern in today visit was that he wanted to go home.  Medications    atenolol  25 mg Oral BID   enoxaparin (LOVENOX) injection  0.5 mg/kg Subcutaneous Q2200   feeding supplement  1 Container Oral Q24H   hyoscyamine  0.375 mg Oral BID   lisinopril  10 mg Oral Daily   montelukast  10 mg Oral QHS   pantoprazole  40 mg Oral Daily   pneumococcal 23 valent vaccine  0.5 mL Intramuscular Tomorrow-1000         VHQ:IONGE from the symptoms mentioned above,there are no other symptoms referable to all systems reviewed.  Physical Exam: Vital signs in last 24 hours: Temp:  [97.6 F (36.4 C)-98.6 F (37 C)] 98.6 F (37 C) (11/05 0838) Pulse Rate:  [56-65] 56 (11/05 0838) Resp:  [13-18] 18 (11/05 0838) BP: (122-161)/(64-79) 122/76 (11/05 0838) SpO2:  [93 %-100 %] 98 % (11/05 0838) Weight:  [122.8 kg] 122.8 kg (11/04 2326) Weight change: -8.743 kg    Intake/Output from previous day: 11/04 0701 - 11/05 0700 In: -  Out: 2600 [Urine:2600] Total I/O In: -  Out: 1000 [Urine:1000]   Physical Exam:  General- pt is awake,alert, oriented to time place and person  Resp- No acute REsp distress, CTA B/L NO Rhonchi  CVS- S1S2 regular in rate and rhythm  GIT- BS+, soft, Non tender , Non distended  EXT- No LE Edema,  No Cyanosis    Lab Results:  CBC  Recent Labs    06/13/21 2003 06/14/21 0717  WBC 7.0 6.5  HGB 12.9* 12.5*  HCT 37.0* 36.5*  PLT 217 205    BMET  Recent Labs    06/13/21 2003 06/14/21 0717 06/14/21 1236 06/15/21 0809  NA 125* 128* 128*  130*  K 4.5 4.3  --   --   CL 90* 95*  --   --   CO2 26 26  --   --   GLUCOSE 143* 141*  --   --   BUN 9 10  --   --   CREATININE 0.50* 0.64  --   --   CALCIUM 8.9 8.7*  --   --       Most recent Creatinine trend  Lab Results  Component Value Date   CREATININE 0.64 06/14/2021   CREATININE 0.50 (L) 06/13/2021   CREATININE 0.59 (L) 04/29/2021      MICRO   Recent Results (from the past 240 hour(s))  Resp Panel by RT-PCR (Flu A&B, Covid) Nasopharyngeal Swab     Status: None   Collection Time: 06/13/21  8:03 PM   Specimen: Nasopharyngeal Swab; Nasopharyngeal(NP) swabs in vial transport medium  Result Value Ref Range Status   SARS Coronavirus 2 by RT PCR NEGATIVE NEGATIVE Final    Comment: (NOTE) SARS-CoV-2 target nucleic acids are NOT DETECTED.  The SARS-CoV-2 RNA is generally detectable in upper respiratory specimens during the acute phase of infection. The lowest concentration of SARS-CoV-2 viral copies this assay can detect is 138 copies/mL. A negative result does not preclude SARS-Cov-2 infection and  should not be used as the sole basis for treatment or other patient management decisions. A negative result may occur with  improper specimen collection/handling, submission of specimen other than nasopharyngeal swab, presence of viral mutation(s) within the areas targeted by this assay, and inadequate number of viral copies(<138 copies/mL). A negative result must be combined with clinical observations, patient history, and epidemiological information. The expected result is Negative.  Fact Sheet for Patients:  BloggerCourse.comhttps://www.fda.gov/media/152166/download  Fact Sheet for Healthcare Providers:  SeriousBroker.ithttps://www.fda.gov/media/152162/download  This test is no t yet approved or cleared by the [place] FDA and  has been authorized for detection and/or diagnosis of SARS-CoV-2 by FDA under an Emergency Use Authorization (EUA). This EUA will remain  in effect (meaning this test  can be used) for the duration of the COVID-19 declaration under Section 564(b)(1) of the Act, 21 U.S.C.section 360bbb-3(b)(1), unless the authorization is terminated  or revoked sooner.       Influenza A by PCR NEGATIVE NEGATIVE Final   Influenza B by PCR NEGATIVE NEGATIVE Final    Comment: (NOTE) The Xpert Xpress SARS-CoV-2/FLU/RSV plus assay is intended as an aid in the diagnosis of influenza from Nasopharyngeal swab specimens and should not be used as a sole basis for treatment. Nasal washings and aspirates are unacceptable for Xpert Xpress SARS-CoV-2/FLU/RSV testing.  Fact Sheet for Patients: BloggerCourse.comhttps://www.fda.gov/media/152166/download  Fact Sheet for Healthcare Providers: SeriousBroker.ithttps://www.fda.gov/media/152162/download  This test is not yet approved or cleared by the [place] FDA and has been authorized for detection and/or diagnosis of SARS-CoV-2 by FDA under an Emergency Use Authorization (EUA). This EUA will remain in effect (meaning this test can be used) for the duration of the COVID-19 declaration under Section 564(b)(1) of the Act, 21 U.S.C. section 360bbb-3(b)(1), unless the authorization is terminated or revoked.  Performed at [facility], [address] [place], [place] [address]          Impression:  Carl HaffWilliam Daniel [name] is a 70 y.o. male with medical problems of   A Fib, Aspergers syndrome, HTN, Psychogenic polydipsia., diabetes  was admitted on 06/13/2021 for :   Hyponatremia [E87.1]  1) hyponatremia Patient has hyponatremia secondary to psychogenic polydipsia Data in favor of secondary to polydipsia as patient urinary specific gravity is on the lower side at 1.001  [facility] for [facility], Carl DANIEL "DANIEL" (MRN [id number]) as of 06/15/2021 08:20  Ref. Range 06/17/2007 10:48 01/14/2011 11:30 04/23/2018 11:34 04/28/2021 01:37 04/28/2021 01:41 04/28/2021 02:46 04/28/2021 05:32 04/28/2021 16:47 04/29/2021 05:36 04/30/2021 11:56 06/13/2021 18:46 06/13/2021  18:52 06/13/2021 20:03 06/13/2021 23:20 06/14/2021 00:56 06/14/2021 07:17 06/14/2021 12:36  Specific Gravity, Urine Latest Ref Range: 1.005 - 1.030       1.010      1.001 (L)        Patient's urine osmolality is appropriately low  Results for Carl [name], Carl DANIEL "DANIEL" (MRN [id number]) as of 06/15/2021 08:20  Ref. Range 06/17/2007 10:48 01/14/2011 11:30 04/23/2018 11:34 04/28/2021 01:37 04/28/2021 01:41 04/28/2021 02:46 04/28/2021 05:32 04/28/2021 16:47 04/29/2021 05:36 04/30/2021 11:56 06/13/2021 18:46 06/13/2021 18:52 06/13/2021 20:03 06/13/2021 23:20 06/14/2021 00:56 06/14/2021 07:17 06/14/2021 12:36  Osmolality, Urine Latest Ref Range: 300 - 900 mOsm/kg      189 (L)       122 (L)       Patient sodium is responding to fluid restriction. Patient sodium is now much better at 130   2)HTN    Blood pressure is stable    3)Anemia of chronic disease  CBC Latest Ref Rng &  Units 06/14/2021 06/13/2021 04/28/2021  WBC 4.0 - 10.5 K/uL 6.5 7.0 7.6  Hemoglobin 13.0 - 17.0 g/dL 12.5(L) 12.9(L) 12.2(L)  Hematocrit 39.0 - 52.0 % 36.5(L) 37.0(L) 34.5(L)  Platelets 150 - 400 K/uL 205 217 211       HGb at goal (9--11)   4) Acid base  Co2 at goal     Plan:  I educated patient at length about need to decrease his free water intake Will continue with the current fluid restriction    Carl [name] s Samai Corea 06/15/2021, 1:02 PM

## 2021-06-15 NOTE — Discharge Summary (Signed)
Physician Discharge Summary  Christerpher Clos Strahan ZOX:[id number] DOB: 1951/06/21 DOA: 06/13/2021  PCP: Gracelyn Nurse, MD  Admit date: 06/13/2021 Discharge date: 06/15/2021  Admitted From: Home Disposition:  Home  Discharge Condition:Stable CODE STATUS:FULL Diet recommendation: Heart Healthy    Brief/Interim Summary:  Patient is a 70 year old male with history of chronic  hyponatremia, Asperger's syndrome, hypertension, asthma who presents to the emergency department after he was referred for evaluation of abnormal labs.  He reports drinking more than a gallon of water a day and has craving for drinking water.  On presentation sodium level was 115.  Nephrology also consulted and was following.  Hyponatremia was thought to be secondary to excessive water drinking/psychogenic polydipsia.  Sodium level improved to 130 today.  He is hemodynamically stable for discharge.  Patient has been counseled to avoid excessive water drinking.  He has been recommended to check sodium level in a week.  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  Low urine osmolality, low plasma osmolality.  Urine sodium is normal.  Presented with sodium of 115.  Started on fluid restriction.  Sodium level has already improved to 130.  Nephrology was also consulted and following Chest x-ray did not show any lung mass.  Low suspicion for SIADH. TSH normal  Patient has been counseled to limit oral intake of water.  He attributes drinking more water after he was started on lisinopril and atenolol by his cardiologist. Extensive counseling done at the bedside.  We recommended to try ice chips/frozen grapes, protein rich drink.   History of leg cramps/restless leg syndrome:   Normal vitamin B12.Also ordered muscle relaxants   History of hypertension: On atenolol ,lisinopril at home.   Currently BP stable   GERD: On PPI  Discharge Diagnoses:  Principal Problem:   Hyponatremia Active Problems:   Asperger's syndrome   Hypertension   Psychogenic polydipsia    Discharge Instructions  Discharge Instructions     Diet - low sodium heart healthy   Complete by: As directed    Discharge instructions   Complete by: As directed    1)Please avoid drinking excessive water. Take ice chips or frozen grapes to quench your thirst or if you have argue to drink excessive water 2)Follow up with your PCP in a week.  Do a BMP just to check your sodium level during the follow-up.   Increase activity slowly   Complete by: As directed       Allergies as of 06/15/2021   No Known Allergies      Medication List     TAKE these medications    albuterol 108 (90 Base) MCG/ACT inhaler Commonly known as: VENTOLIN HFA Inhale 2 puffs into the lungs every 6 (six) hours as needed for wheezing.   ascorbic acid 1000 MG tablet Commonly known as: VITAMIN C Take 1,000 mg by mouth daily.   atenolol 25 MG tablet Commonly known as: TENORMIN Take by mouth 2 (two) times daily.   cetirizine 10 MG tablet Commonly known as: ZYRTEC Take 10 mg by mouth daily.   fluticasone 110 MCG/ACT inhaler Commonly known as: FLOVENT HFA Inhale 1 puff into the lungs in the morning and at bedtime.   folic acid 1 MG tablet Commonly known as: FOLVITE Take 1 mg by mouth daily.   Garlic 100 MG Tabs Take 1 tablet by mouth.   hyoscyamine 0.375 MG 12  hr tablet Commonly known as: LEVBID Take 1 tablet by mouth in the morning and at bedtime.   lisinopril 10 MG tablet Commonly known as: ZESTRIL Take 10 mg by mouth daily.   montelukast 10 MG tablet Commonly known as: SINGULAIR Take 10 mg by mouth at bedtime.   mupirocin ointment 2 % Commonly known as: BACTROBAN Apply 1 application topically as needed.   pantoprazole 40 MG tablet Commonly known as: PROTONIX Take 40 mg by mouth daily.   QC  TUMERIC COMPLEX PO Take 1 tablet by mouth daily.   SALMON OIL PO Take 1 capsule by mouth daily.   Zinc Citrate-Phytase 25-500 MG Caps Take by mouth.        Follow-up Information     Gracelyn Nurse, MD. Schedule an appointment as soon as possible for a visit in 1 week(s).   Specialty: Internal Medicine Contact information: [address] [place] [address] [phone]                No Known Allergies  Consultations: Nephrology   Procedures/Studies: US Venous Img Lower Bilateral (DVT)  Result Date: 06/13/2021 CLINICAL DATA:  Bilateral leg swelling EXAM: BILATERAL LOWER EXTREMITY VENOUS DOPPLER ULTRASOUND TECHNIQUE: Gray-scale sonography with compression, as well as color and duplex ultrasound, were performed to evaluate the deep venous system(s) from the level of the common femoral vein through the popliteal and proximal calf veins. COMPARISON:  None. FINDINGS: VENOUS Normal compressibility of the common femoral, superficial femoral, and popliteal veins, as well as the visualized calf veins. Visualized portions of profunda femoral vein and great saphenous vein unremarkable. No filling defects to suggest DVT on grayscale or color Doppler imaging. Doppler waveforms show normal direction of venous flow, normal respiratory plasticity and response to augmentation. OTHER None. Limitations: none IMPRESSION: Negative. Electronically Signed   By: Charlett Nose M.D.   On: 06/13/2021 23:35   DG Chest Port 1 View  Result Date: 06/14/2021 CLINICAL DATA:  Hyponatremia EXAM: PORTABLE CHEST 1 VIEW COMPARISON:  None. FINDINGS: The heart size and mediastinal contours are within normal limits. Both lungs are clear. The visualized skeletal structures are unremarkable. IMPRESSION: No active disease. Electronically Signed   By: Deatra Robinson M.D.   On: 06/14/2021 01:10      Subjective:  Patient seen and examined at bedside this morning.  Medically stable for  discharge  Discharge Exam: Vitals:   06/15/21 0539 06/15/21 0838  BP: 122/67 122/76  Pulse: (!) 57 (!) 56  Resp: 18 18  Temp: 97.6 F (36.4 C) 98.6 F (37 C)  SpO2: 100% 98%   Vitals:   06/14/21 2306 06/14/21 2326 06/15/21 0539 06/15/21 0838  BP: (!) 161/75 (!) 155/64 122/67 122/76  Pulse: 62 65 (!) 57 (!) 56  Resp: 14 16 18 18   Temp: 97.8 F (36.6 C) 98 F (36.7 C) 97.6 F (36.4 C) 98.6 F (37 C)  TempSrc: Oral Oral Oral   SpO2: 98% 100% 100% 98%  Weight:  122.8 kg    Height:  6\' 4"  (1.93 m)      General: Pt is alert, awake, not in acute distress Cardiovascular: RRR, S1/S2 +, no rubs, no gallops Respiratory: CTA bilaterally, no wheezing, no rhonchi Abdominal: Soft, NT, ND, bowel sounds + Extremities: no edema, no cyanosis    The results of significant diagnostics from this hospitalization (including imaging, microbiology, ancillary and laboratory) are listed below for reference.     Microbiology: Recent Results (from the past 240 hour(s))  Resp Panel by RT-PCR (Flu A&B, Covid) Nasopharyngeal Swab     Status: None   Collection Time: 06/13/21  8:03 PM   Specimen: Nasopharyngeal Swab; Nasopharyngeal(NP) swabs in vial transport medium  Result Value Ref Range Status   SARS Coronavirus 2 by RT PCR NEGATIVE NEGATIVE Final    Comment: (NOTE) SARS-CoV-2 target nucleic acids are NOT DETECTED.  The SARS-CoV-2 RNA is generally detectable in upper respiratory specimens during the acute phase of infection. The lowest concentration of SARS-CoV-2 viral copies this assay can detect is 138 copies/mL. A negative result does not preclude SARS-Cov-2 infection and should not be used as the sole basis for treatment or other patient management decisions. A negative result may occur with  improper specimen collection/handling, submission of specimen other than nasopharyngeal swab, presence of viral mutation(s) within the areas targeted by this assay, and inadequate number of  viral copies(<138 copies/mL). A negative result must be combined with clinical observations, patient history, and epidemiological information. The expected result is Negative.  Fact Sheet for Patients:  BloggerCourse.com  Fact Sheet for Healthcare Providers:  SeriousBroker.it  This test is no t yet approved or cleared by the [place] FDA and  has been authorized for detection and/or diagnosis of SARS-CoV-2 by FDA under an Emergency Use Authorization (EUA). This EUA will remain  in effect (meaning this test can be used) for the duration of the COVID-19 declaration under Section 564(b)(1) of the Act, 21 U.S.C.section 360bbb-3(b)(1), unless the authorization is terminated  or revoked sooner.       Influenza A by PCR NEGATIVE NEGATIVE Final   Influenza B by PCR NEGATIVE NEGATIVE Final    Comment: (NOTE) The Xpert Xpress SARS-CoV-2/FLU/RSV plus assay is intended as an aid in the diagnosis of influenza from Nasopharyngeal swab specimens and should not be used as a sole basis for treatment. Nasal washings and aspirates are unacceptable for Xpert Xpress SARS-CoV-2/FLU/RSV testing.  Fact Sheet for Patients: BloggerCourse.com  Fact Sheet for Healthcare Providers: SeriousBroker.it  This test is not yet approved or cleared by the [place] FDA and has been authorized for detection and/or diagnosis of SARS-CoV-2 by FDA under an Emergency Use Authorization (EUA). This EUA will remain in effect (meaning this test can be used) for the duration of the COVID-19 declaration under Section 564(b)(1) of the Act, 21 U.S.C. section 360bbb-3(b)(1), unless the authorization is terminated or revoked.  Performed at [facility], [address] [place], [place] [address]      Labs: BNP (last 3 results) Recent Labs    06/13/21 2003  BNP 79.3   Basic Metabolic Panel: Recent  Labs  Lab 06/13/21 2003 06/14/21 0717 06/14/21 1236 06/15/21 0809  NA 125* 128* 128* 130*  K 4.5 4.3  --   --   CL 90* 95*  --   --   CO2 26 26  --   --   GLUCOSE 143* 141*  --   --   BUN 9 10  --   --   CREATININE 0.50* 0.64  --   --   CALCIUM 8.9 8.7*  --   --   MG 2.0  --   --   --   PHOS 3.1  --   --   --    Liver Function Tests: Recent Labs  Lab 06/13/21 2003  AST 23  ALT 24  ALKPHOS 54  BILITOT 1.3*  PROT 7.2  ALBUMIN 4.1   No results for input(s): LIPASE, AMYLASE in the  last 168 hours. No results for input(s): AMMONIA in the last 168 hours. CBC: Recent Labs  Lab 06/13/21 2003 06/14/21 0717  WBC 7.0 6.5  NEUTROABS 5.5  --   HGB 12.9* 12.5*  HCT 37.0* 36.5*  MCV 80.3 80.6  PLT 217 205   Cardiac Enzymes: No results for input(s): CKTOTAL, CKMB, CKMBINDEX, TROPONINI in the last 168 hours. BNP: Invalid input(s): POCBNP CBG: No results for input(s): GLUCAP in the last 168 hours. D-Dimer No results for input(s): DDIMER in the last 72 hours. Hgb A1c No results for input(s): HGBA1C in the last 72 hours. Lipid Profile No results for input(s): CHOL, HDL, LDLCALC, TRIG, CHOLHDL, LDLDIRECT in the last 72 hours. Thyroid function studies Recent Labs    06/13/21 2003  TSH 2.221   Anemia work up Recent Labs    06/14/21 0717  VITAMINB12 693   Urinalysis    Component Value Date/Time   COLORURINE COLORLESS (A) 06/13/2021 1852   APPEARANCEUR CLEAR (A) 06/13/2021 1852   LABSPEC 1.001 (L) 06/13/2021 1852   PHURINE 8.0 06/13/2021 1852   GLUCOSEU NEGATIVE 06/13/2021 1852   HGBUR NEGATIVE 06/13/2021 1852   BILIRUBINUR NEGATIVE 06/13/2021 1852   KETONESUR NEGATIVE 06/13/2021 1852   PROTEINUR NEGATIVE 06/13/2021 1852   NITRITE NEGATIVE 06/13/2021 1852   LEUKOCYTESUR NEGATIVE 06/13/2021 1852   Sepsis Labs Invalid input(s): PROCALCITONIN,  WBC,  LACTICIDVEN Microbiology Recent Results (from the past 240 hour(s))  Resp Panel by RT-PCR (Flu A&B, Covid)  Nasopharyngeal Swab     Status: None   Collection Time: 06/13/21  8:03 PM   Specimen: Nasopharyngeal Swab; Nasopharyngeal(NP) swabs in vial transport medium  Result Value Ref Range Status   SARS Coronavirus 2 by RT PCR NEGATIVE NEGATIVE Final    Comment: (NOTE) SARS-CoV-2 target nucleic acids are NOT DETECTED.  The SARS-CoV-2 RNA is generally detectable in upper respiratory specimens during the acute phase of infection. The lowest concentration of SARS-CoV-2 viral copies this assay can detect is 138 copies/mL. A negative result does not preclude SARS-Cov-2 infection and should not be used as the sole basis for treatment or other patient management decisions. A negative result may occur with  improper specimen collection/handling, submission of specimen other than nasopharyngeal swab, presence of viral mutation(s) within the areas targeted by this assay, and inadequate number of viral copies(<138 copies/mL). A negative result must be combined with clinical observations, patient history, and epidemiological information. The expected result is Negative.  Fact Sheet for Patients:  BloggerCourse.com  Fact Sheet for Healthcare Providers:  SeriousBroker.it  This test is no t yet approved or cleared by the [place] FDA and  has been authorized for detection and/or diagnosis of SARS-CoV-2 by FDA under an Emergency Use Authorization (EUA). This EUA will remain  in effect (meaning this test can be used) for the duration of the COVID-19 declaration under Section 564(b)(1) of the Act, 21 U.S.C.section 360bbb-3(b)(1), unless the authorization is terminated  or revoked sooner.       Influenza A by PCR NEGATIVE NEGATIVE Final   Influenza B by PCR NEGATIVE NEGATIVE Final    Comment: (NOTE) The Xpert Xpress SARS-CoV-2/FLU/RSV plus assay is intended as an aid in the diagnosis of influenza from Nasopharyngeal swab specimens and should not be  used as a sole basis for treatment. Nasal washings and aspirates are unacceptable for Xpert Xpress SARS-CoV-2/FLU/RSV testing.  Fact Sheet for Patients: BloggerCourse.com  Fact Sheet for Healthcare Providers: SeriousBroker.it  This test is not yet approved or cleared by the  [place] [employer] and has been authorized for detection and/or diagnosis of SARS-CoV-2 by FDA under an TEFL teacher (EUA). This EUA will remain in effect (meaning this test can be used) for the duration of the COVID-19 declaration under Section 564(b)(1) of the Act, 21 U.S.C. section 360bbb-3(b)(1), unless the authorization is terminated or revoked.  Performed at [facility], [address], [place], [place] [address]     Please note: You were cared for by a hospitalist during your hospital stay. Once you are discharged, your primary care physician will handle any further medical issues. Please note that NO REFILLS for any discharge medications will be authorized once you are discharged, as it is imperative that you return to your primary care physician (or establish a relationship with a primary care physician if you do not have one) for your post hospital discharge needs so that they can reassess your need for medications and monitor your lab values.    Time coordinating discharge: 40 minutes  SIGNED:   Burnadette Pop, MD  [facility] 06/15/2021, 10:27 AM Pager [phone]  If 7PM-7AM, please contact night-coverage [email or web address] Password [email or web address]

## 2021-06-15 NOTE — TOC Transition Note (Signed)
Transition of Care [facility]) - CM/SW Discharge Note   Patient Details  Name: Dymond Spreen MRN: [id number] Date of Birth: Sep 03, 1950  [facility] CM/SW Contact:  Bing Quarry, RN Phone Number: 06/15/2021, 12:52 PM   Clinical Narrative:  Unit RN requested [employer] Voucher for patient being discharged today. Voucher printed to unit for RN to set up when needed. Gabriel Cirri RN CM      Final next level of care: Home/Self Care Barriers to Discharge: Barriers Resolved   Patient Goals and CMS Choice        Discharge Placement                       Discharge Plan and Services                DME Arranged: N/A DME Agency: NA       HH Arranged: NA HH Agency: NA        Social Determinants of Health (SDOH) Interventions     Readmission Risk Interventions No flowsheet data found.

## 2021-06-17 LAB — KAPPA/LAMBDA LIGHT CHAINS
Kappa free light chain: 19.9 mg/L — ABNORMAL HIGH (ref 3.3–19.4)
Kappa, lambda light chain ratio: 1.27 (ref 0.26–1.65)
Lambda free light chains: 15.7 mg/L (ref 5.7–26.3)

## 2021-06-18 LAB — PROTEIN ELECTROPHORESIS, SERUM
A/G Ratio: 1.3 (ref 0.7–1.7)
Albumin ELP: 3.8 g/dL (ref 2.9–4.4)
Alpha-1-Globulin: 0.3 g/dL (ref 0.0–0.4)
Alpha-2-Globulin: 0.6 g/dL (ref 0.4–1.0)
Beta Globulin: 1 g/dL (ref 0.7–1.3)
Gamma Globulin: 1.1 g/dL (ref 0.4–1.8)
Globulin, Total: 3 g/dL (ref 2.2–3.9)
Total Protein ELP: 6.8 g/dL (ref 6.0–8.5)

## 2021-06-18 LAB — VITAMIN B1: Vitamin B1 (Thiamine): 119 nmol/L (ref 66.5–200.0)

## 2021-07-21 ENCOUNTER — Observation Stay
Admission: EM | Admit: 2021-07-21 | Discharge: 2021-07-23 | Disposition: A | Discharge status: Home / Self Care | Payer: Medicare Other | Attending: Emergency Medicine | Admitting: Emergency Medicine

## 2021-07-21 ENCOUNTER — Other Ambulatory Visit: Payer: Self-pay

## 2021-07-21 ENCOUNTER — Emergency Department: Payer: Medicare Other

## 2021-07-21 DIAGNOSIS — I4891 Unspecified atrial fibrillation: Secondary | ICD-10-CM | POA: Diagnosis present

## 2021-07-21 DIAGNOSIS — R0789 Other chest pain: Principal | ICD-10-CM | POA: Insufficient documentation

## 2021-07-21 DIAGNOSIS — E119 Type 2 diabetes mellitus without complications: Secondary | ICD-10-CM

## 2021-07-21 DIAGNOSIS — J45909 Unspecified asthma, uncomplicated: Secondary | ICD-10-CM | POA: Insufficient documentation

## 2021-07-21 DIAGNOSIS — I10 Essential (primary) hypertension: Secondary | ICD-10-CM | POA: Diagnosis not present

## 2021-07-21 DIAGNOSIS — E871 Hypo-osmolality and hyponatremia: Secondary | ICD-10-CM

## 2021-07-21 DIAGNOSIS — Z79899 Other long term (current) drug therapy: Secondary | ICD-10-CM | POA: Insufficient documentation

## 2021-07-21 DIAGNOSIS — Z20822 Contact with and (suspected) exposure to covid-19: Secondary | ICD-10-CM | POA: Diagnosis not present

## 2021-07-21 DIAGNOSIS — F54 Psychological and behavioral factors associated with disorders or diseases classified elsewhere: Secondary | ICD-10-CM | POA: Diagnosis present

## 2021-07-21 DIAGNOSIS — R631 Polydipsia: Secondary | ICD-10-CM | POA: Diagnosis present

## 2021-07-21 DIAGNOSIS — K219 Gastro-esophageal reflux disease without esophagitis: Secondary | ICD-10-CM | POA: Diagnosis present

## 2021-07-21 DIAGNOSIS — F845 Asperger's syndrome: Secondary | ICD-10-CM | POA: Diagnosis present

## 2021-07-21 DIAGNOSIS — K5900 Constipation, unspecified: Secondary | ICD-10-CM | POA: Diagnosis present

## 2021-07-21 LAB — BASIC METABOLIC PANEL
Anion gap: 6 (ref 5–15)
BUN: 12 mg/dL (ref 8–23)
CO2: 25 mmol/L (ref 22–32)
Calcium: 8.8 mg/dL — ABNORMAL LOW (ref 8.9–10.3)
Chloride: 87 mmol/L — ABNORMAL LOW (ref 98–111)
Creatinine, Ser: 0.71 mg/dL (ref 0.61–1.24)
GFR, Estimated: >60 mL/min (ref >60)
Glucose, Bld: 146 mg/dL — ABNORMAL HIGH (ref 70–99)
Potassium: 4.6 mmol/L (ref 3.5–5.1)
Sodium: 118 mmol/L — CL (ref 135–145)

## 2021-07-21 LAB — CBC
HCT: 39.7 % (ref 39.0–52.0)
Hemoglobin: 13.5 g/dL (ref 13.0–17.0)
MCH: 28 pg (ref 26.0–34.0)
MCHC: 34 g/dL (ref 30.0–36.0)
MCV: 82.2 fL (ref 80.0–100.0)
Platelets: 228 10*3/uL (ref 150–400)
RBC: 4.83 MIL/uL (ref 4.22–5.81)
RDW: 14.4 % (ref 11.5–15.5)
WBC: 7.1 10*3/uL (ref 4.0–10.5)
nRBC: 0 % (ref 0.0–0.2)

## 2021-07-21 LAB — RESP PANEL BY RT-PCR (FLU A&B, COVID) ARPGX2
Influenza A by PCR: NEGATIVE
Influenza B by PCR: NEGATIVE
SARS Coronavirus 2 by RT PCR: NEGATIVE

## 2021-07-21 LAB — TROPONIN I (HIGH SENSITIVITY)
Troponin I (High Sensitivity): 12 ng/L (ref <18)
Troponin I (High Sensitivity): 10 ng/L (ref <18)

## 2021-07-21 MED ORDER — CYCLOBENZAPRINE HCL 10 MG PO TABS
10.0000 mg | ORAL_TABLET | Freq: Three times a day (TID) | ORAL | Status: DC | PRN
  Administered 2021-07-22: 12:00:00 10 mg via ORAL
  Filled 2021-07-21: qty 1

## 2021-07-21 NOTE — ED Notes (Signed)
Pt c/o leg cramping at this time in both legs. Dr notified.

## 2021-07-21 NOTE — ED Provider Notes (Addendum)
  Emergency Medicine Provider Triage Evaluation Note  Carl [name] , a 70 y.o.male,  was evaluated in triage.  Pt complains of palpitations.  Patient states that when he gets constipated he starts to have palpitations.  He believes he is constipated now, his last bowel movement was yesterday but does not feel like he is got it all out.  He states that he currently does not feel any chest pain or discomfort at this time.  Patient states that he is trying to find a way to relieve his constipation without, drink a lot of water.   Review of Systems  Positive: Palpitations Negative: Denies fever, chest pain, vomiting  Physical Exam   Vitals:   07/21/21 1509  BP: (!) 152/68  Pulse: 63  Resp: 18  Temp: 98.7 F (37.1 C)  SpO2: 100%   Gen:   Awake, no distress   Resp:  Normal effort  MSK:   Moves extremities without difficulty  Other:    Medical Decision Making  Given the patient's initial medical screening exam, the following diagnostic evaluation has been ordered. The patient will be placed in the appropriate treatment space, once one is available, to complete the evaluation and treatment. I have discussed the plan of care with the patient and I have advised the patient that an ED physician or mid-level practitioner will reevaluate their condition after the test results have been received, as the results may give them additional insight into the type of treatment they may need.    Diagnostics: EKG, labs, CXR.  Treatments: none immediately   Varney Daily, PA 07/21/21 1523    Varney Daily, [place] 07/21/21 1524    Merwyn Katos, MD 07/21/21 1626

## 2021-07-21 NOTE — H&P (Addendum)
History and Physical  Carl [name] IRS:[id number] DOB: June 27, 1951 DOA: 07/21/2021  Referring physician: Dr. Jene Every PCP: Gracelyn Nurse, MD  Outpatient Specialists: None Patient coming from: Home & is able to ambulate   Chief Complaint: Palpitation  HPI: Carl [name] is a 70 y.o. male with medical history significant for past medical history significant for anemia Asperger's syndrome asthma atrial fibrillation not on any anticoagulation GERD on Protonix , hypertension, polydipsia psychogenic polydipsia and perceived constipation who presented to the emergency department with chest pain that started at home but none when she he arrived to the ED also palpitation..  Patient has a history of hyponatremia.  Patient stated that he had been drinking a lot of fluid and that is what he usually does to flush out his bowel because he seems to think that he has not completely emptied his bowels despite the fact that he is making bowel movements.  Patient denied chest pain now he said however he is feeling some discomfort in the epigastric/upper abdomen like his stomach is upset a little.  Has had a bowel movement today but he feels the need to do more bowel movement and that he is dehydrated and when he is dehydrated he is constipated and has to drink a lot of water.Marland Kitchen  He said he is dehydrated now despite the fact that he has been drinking a lot of water  ED Course: EKG was nonrevealing, his troponin were negative, chest x-ray did not show any abnormality patient did not have any more chest pain on arrival to the ED however chemistry shows sodium of 118  Review of Systems: Pt complains of epigastric discomfort, constipation,    Review of systems are otherwise negative   Past Medical History:  Diagnosis Date   Anemia    Asperger's syndrome    Asthma    Atrial fibrillation (HCC)    Diverticulosis    GERD (gastroesophageal reflux disease)    History of MRSA infection     Hypertension    Hyponatremia    Psoriasis    Valvular heart disease    Past Surgical History:  Procedure Laterality Date   COLONOSCOPY     COLONOSCOPY WITH PROPOFOL N/A 04/23/2018   Procedure: COLONOSCOPY WITH PROPOFOL;  Surgeon: Christena Deem, MD;  Location: [facility] ENDOSCOPY;  Service: Endoscopy;  Laterality: N/A;    Social History:  reports that he has never smoked. He has never used smokeless tobacco. He reports that he does not currently use alcohol. He reports that he does not use drugs.   No Known Allergies  No family history on file.    Prior to Admission medications   Medication Sig Start Date End Date Taking? Authorizing Provider  albuterol (VENTOLIN HFA) 108 (90 Base) MCG/ACT inhaler Inhale 2 puffs into the lungs every 6 (six) hours as needed for wheezing. 11/18/18   [provider]  ascorbic acid (VITAMIN C) 1000 MG tablet Take 1,000 mg by mouth daily.    [provider]  atenolol (TENORMIN) 25 MG tablet Take by mouth 2 (two) times daily.     [provider]  cetirizine (ZYRTEC) 10 MG tablet Take 10 mg by mouth daily.    [provider]  cyclobenzaprine (FLEXERIL) 5 MG tablet Take 1 tablet (5 mg total) by mouth 3 (three) times daily as needed for muscle spasms. 06/15/21   Burnadette Pop, MD  fluticasone (FLOVENT HFA) 110 MCG/ACT inhaler Inhale 1 puff into the lungs in the morning and  at bedtime. 12/06/20 12/06/21  [provider]  folic acid (FOLVITE) 1 MG tablet Take 1 mg by mouth daily.    [provider]  Garlic 100 MG TABS Take 1 tablet by mouth.    [provider]  hyoscyamine (LEVBID) 0.375 MG 12 hr tablet Take 1 tablet by mouth in the morning and at bedtime. 07/26/20   [provider]  lisinopril (PRINIVIL,ZESTRIL) 10 MG tablet Take 10 mg by mouth daily.    [provider]  montelukast (SINGULAIR) 10 MG tablet Take 10 mg by mouth at bedtime.    [provider]  mupirocin ointment  (BACTROBAN) 2 % Apply 1 application topically as needed.    [provider]  Omega-3 Fatty Acids (SALMON OIL PO) Take 1 capsule by mouth daily.    [provider]  pantoprazole (PROTONIX) 40 MG tablet Take 40 mg by mouth daily.    [provider]  Turmeric (QC TUMERIC COMPLEX PO) Take 1 tablet by mouth daily.    [provider]  Zinc Citrate-Phytase 25-500 MG CAPS Take by mouth.    [provider]    Physical Exam: BP (!) 145/66   Pulse 65   Temp 98.7 F (37.1 C) (Oral)   Resp 15   Ht 6\' 4"  (1.93 m)   Wt 132.5 kg   SpO2 100%   BMI 35.54 kg/m   Exam:  General: 70 y.o. year-old male well developed well nourished in no acute distress.  Alert and oriented x3. Cardiovascular: Regular rate and rhythm with no rubs or gallops.  No thyromegaly or JVD noted.   Respiratory: Clear to auscultation with no wheezes or rales. Good inspiratory effort. Abdomen: Soft nontender nondistended with normal bowel sounds x4 quadrants. Musculoskeletal: Trace lower extremity edema. 2/4 pulses in all 4 extremities. Skin: No ulcerative lesions noted or rashes, Psychiatry: Mood is appropriate for condition and setting patient seems to be obsessed with the fact that he has not made a bowel or that he is constipated           Labs on Admission:  Basic Metabolic Panel: Recent Labs  Lab 07/21/21 1510  NA 118*  K 4.6  CL 87*  CO2 25  GLUCOSE 146*  BUN 12  CREATININE 0.71  CALCIUM 8.8*   Liver Function Tests: No results for input(s): AST, ALT, ALKPHOS, BILITOT, PROT, ALBUMIN in the last 168 hours. No results for input(s): LIPASE, AMYLASE in the last 168 hours. No results for input(s): AMMONIA in the last 168 hours. CBC: Recent Labs  Lab 07/21/21 1510  WBC 7.1  HGB 13.5  HCT 39.7  MCV 82.2  PLT 228   Cardiac Enzymes: No results for input(s): CKTOTAL, CKMB, CKMBINDEX, TROPONINI in the last 168 hours.  BNP (last 3 results) Recent Labs     06/13/21 2003  BNP 79.3    ProBNP (last 3 results) No results for input(s): PROBNP in the last 8760 hours.  CBG: No results for input(s): GLUCAP in the last 168 hours.  Radiological Exams on Admission: DG Chest 2 View  Result Date: 07/21/2021 CLINICAL DATA:  Chest pain EXAM: CHEST - 2 VIEW COMPARISON:  06/14/2021 FINDINGS: Heart and mediastinal contours are within normal limits. No focal opacities or effusions. No acute bony abnormality. IMPRESSION: No active cardiopulmonary disease. Electronically Signed   By: 13/11/2020 M.D.   On: 07/21/2021 15:43    EKG: Independently reviewed. Normal sinus rhythm Anterior infarct , age undetermined Abnormal ECG  Assessment/Plan  Present on Admission:  Hypertension  Asperger's syndrome  Atrial fibrillation (HCC)  Acute hyponatremia  Constipation  Psychogenic polydipsia  GERD (gastroesophageal reflux disease)  Hyponatremia  Principal Problem:   Acute hyponatremia Active Problems:   Atrial fibrillation (HCC)   Asperger's syndrome   Hyponatremia   Hypertension   Type 2 diabetes mellitus without complication, without long-term current use of insulin (HCC)   Constipation   Psychogenic polydipsia   GERD (gastroesophageal reflux disease)  1.  Acute severe hyponatremia.  He is sodium is 118 He has had a history of low sodium his last admission was a couple of months ago with output sodium as low as 115. We will start him on fluid restriction We will monitor his electrolytes and fluid intake  2.  Psychogenic polydipsia Patient drinks a lot of water because he feels like he is dehydrated.  He feels the need to push the fluids to flush his system This is contributing to his hyponatremia  3.  Obsession with constipation. Patient pushes water due to the sense of constipation and inadequate bowel emptying. He stated he did have is some bowel movement but he did not feel like it was  not completely out, he felt he needed to do more to  empty his bowel I will give him a one-time dose of MiraLAX Will also put standard bowel regimen in place  4.  Chest pain/palpitation Patient had complaint of chest pain prior to presentation to the ED but he did not have any chest pain on ED EKG is reassuring his troponin is negative  5.  History of atrial fibrillation he is not on any anticoagulation his EKG shows normal sinus rhythm with no T wave abnormality  6.  Other electrolyte abnormality is calcium is slightly low at 8.8 This could be dilutional due to increased polydipsia Will monitor  7.  Hypertension slightly elevated we will continue his home dose of medication: Atenolol  Severity of Illness: The appropriate patient status for this patient is OBSERVATION. Observation status is judged to be reasonable and necessary in order to provide the required intensity of service to ensure the patient's safety. The patient's presenting symptoms, physical exam findings, and initial radiographic and laboratory data in the context of their medical condition is felt to place them at decreased risk for further clinical deterioration. Furthermore, it is anticipated that the patient will be medically stable for discharge from the hospital within 2 midnights of admission.    DVT prophylaxis: TED hose  Code Status: Full  Family Communication: None at bedside  Disposition Plan: Home when stable  Consults called: None  Admission status: Observation   Myrtie Neither MD [facility] Pager [phone]  If 7PM-7AM, please contact night-coverage [email or web address] Password [facility]  07/21/2021, 7:26 PM

## 2021-07-21 NOTE — ED Triage Notes (Signed)
Pt comes with c/o palpitations and irregular heart beat that started today. Pt states some constipation as well for today.  Pt denies any SOB.

## 2021-07-21 NOTE — ED Provider Notes (Signed)
[facility] Emergency Department Provider Note   ____________________________________________    I have reviewed the triage vital signs and the nursing notes.   HISTORY  Chief Complaint Chest Pain     HPI Carl [name] is a 70 y.o. male with history of asked Buerger's syndrome, apparently atrial fibrillation, psychogenic polydipsia who presents after an episode of chest discomfort, palpitations this morning.  Patient reports that he also has felt constipated and does seem to have been drinking quite a bit of water in an attempt to "flush this out ".  No chest pain in the emergency department  Review of medical records demonstrates the patient was admitted in November for hyponatremia thought to be related to excessive water consumption  Past Medical History:  Diagnosis Date   Anemia    Asperger's syndrome    Asthma    Atrial fibrillation (HCC)    Diverticulosis    GERD (gastroesophageal reflux disease)    History of MRSA infection    Hypertension    Hyponatremia    Psoriasis    Valvular heart disease     Patient Active Problem List   Diagnosis Date Noted   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   Acute hyponatremia 04/28/2021   Constipation 04/28/2021   Atrial fibrillation (HCC)    Asperger's syndrome    Hyponatremia    Hypertension    Asthma    Postural dizziness with presyncope    Type 2 diabetes mellitus without complication, without long-term current use of insulin (HCC) 06/23/2018    Past Surgical History:  Procedure Laterality Date   COLONOSCOPY     COLONOSCOPY WITH PROPOFOL N/A 04/23/2018   Procedure: COLONOSCOPY WITH PROPOFOL;  Surgeon: Christena Deem, MD;  Location: [facility] ENDOSCOPY;  Service: Endoscopy;  Laterality: N/A;    Prior to Admission medications   Medication Sig Start Date End Date Taking? Authorizing Provider  albuterol (VENTOLIN HFA) 108 (90 Base) MCG/ACT inhaler Inhale 2 puffs into the lungs every 6  (six) hours as needed for wheezing. 11/18/18   [provider]  ascorbic acid (VITAMIN C) 1000 MG tablet Take 1,000 mg by mouth daily.    [provider]  atenolol (TENORMIN) 25 MG tablet Take by mouth 2 (two) times daily.     [provider]  cetirizine (ZYRTEC) 10 MG tablet Take 10 mg by mouth daily.    [provider]  cyclobenzaprine (FLEXERIL) 5 MG tablet Take 1 tablet (5 mg total) by mouth 3 (three) times daily as needed for muscle spasms. 06/15/21   Burnadette Pop, MD  fluticasone (FLOVENT HFA) 110 MCG/ACT inhaler Inhale 1 puff into the lungs in the morning and at bedtime. 12/06/20 12/06/21  [provider]  folic acid (FOLVITE) 1 MG tablet Take 1 mg by mouth daily.    [provider]  Garlic 100 MG TABS Take 1 tablet by mouth.    [provider]  hyoscyamine (LEVBID) 0.375 MG 12 hr tablet Take 1 tablet by mouth in the morning and at bedtime. 07/26/20   [provider]  lisinopril (PRINIVIL,ZESTRIL) 10 MG tablet Take 10 mg by mouth daily.    [provider]  montelukast (SINGULAIR) 10 MG tablet Take 10 mg by mouth at bedtime.    [provider]  mupirocin ointment (BACTROBAN) 2 % Apply 1 application topically as needed.    [provider]  Omega-3 Fatty Acids (SALMON OIL PO) Take 1 capsule by mouth daily.    [provider]  pantoprazole (PROTONIX) 40 MG tablet Take 40 mg by mouth daily.    [provider]  Turmeric (QC TUMERIC COMPLEX PO) Take 1 tablet by mouth daily.    [provider]  Zinc Citrate-Phytase 25-500 MG CAPS Take by mouth.    [provider]     Allergies Patient has no known allergies.  No family history on file.  Social History Social History   Tobacco Use   Smoking status: Never   Smokeless tobacco: Never  Vaping Use   Vaping Use: Never used  Substance Use Topics   Alcohol use: Not Currently    Comment: rarely   Drug use: Never     Review of Systems  Constitutional: No fever/chills Eyes: No visual changes.  ENT: No sore throat. Cardiovascular: As above Respiratory: Denies shortness of breath. Gastrointestinal: No abdominal pain.  Feeling of constipation Genitourinary: Negative for dysuria. Musculoskeletal: Negative for back pain. Skin: Negative for rash. Neurological: Negative for headaches or weakness   ____________________________________________   PHYSICAL EXAM:  VITAL SIGNS: ED Triage Vitals  Enc Vitals Group     BP 07/21/21 1509 (!) 152/68     Pulse Rate 07/21/21 1509 63     Resp 07/21/21 1509 18     Temp 07/21/21 1509 98.7 F (37.1 C)     Temp Source 07/21/21 1509 Oral     SpO2 07/21/21 1509 100 %     Weight 07/21/21 1510 132.5 kg (292 lb)     Height 07/21/21 1510 1.93 m (6\' 4" )     Head Circumference --      Peak Flow --      Pain Score 07/21/21 1514 0     Pain Loc --      Pain Edu? --      Excl. in GC? --     Constitutional: Alert and oriented.  Eyes: Conjunctivae are normal.   Nose: No congestion/rhinnorhea. Mouth/Throat: Mucous membranes are moist.    Cardiovascular: Normal rate, regular rhythm.  Respiratory: Normal respiratory effort.  No retractions. Lungs CTAB. Gastrointestinal: Soft and nontender. No distention.  No CVA tenderness.  Musculoskeletal: No lower extremity tenderness nor edema.  Warm and well perfused Neurologic:  Normal speech and language. No gross focal neurologic deficits are appreciated.  Skin:  Skin is warm, dry and intact. No rash noted. Psychiatric: Mood and affect are normal. Speech and behavior are normal.  ____________________________________________   LABS (all labs ordered are listed, but only abnormal results are displayed)  Labs Reviewed  BASIC METABOLIC PANEL - Abnormal; Notable for the following components:      Result Value   Sodium 118 (*)    Chloride 87 (*)    Glucose, Bld 146 (*)    Calcium 8.8 (*)    All other components  within normal limits  RESP PANEL BY RT-PCR (FLU A&B, COVID) ARPGX2  CBC  TROPONIN I (HIGH SENSITIVITY)  TROPONIN I (HIGH SENSITIVITY)   ____________________________________________  EKG  ED ECG REPORT I, 14/11/22, the attending physician, personally viewed and interpreted this ECG.  Date: 07/21/2021  Rhythm: normal sinus rhythm QRS Axis: normal Intervals: normal ST/T Wave abnormalities: normal Narrative Interpretation: no evidence of acute ischemia  ____________________________________________  RADIOLOGY  Chest x-ray reviewed by me, no acute abnormality ____________________________________________   PROCEDURES  Procedure(s) performed: No  Procedures   Critical Care performed: yes  CRITICAL CARE Performed by: 14/06/2021   Total critical care time: 35 minutes  Critical care time was  exclusive of separately billable procedures and treating other patients.  Critical care was necessary to treat or prevent imminent or life-threatening deterioration.  Critical care was time spent personally by me on the following activities: development of treatment plan with patient and/or surrogate as well as nursing, discussions with consultants, evaluation of patient's response to treatment, examination of patient, obtaining history from patient or surrogate, ordering and performing treatments and interventions, ordering and review of laboratory studies, ordering and review of radiographic studies, pulse oximetry and re-evaluation of patient's condition.  ____________________________________________   INITIAL IMPRESSION / ASSESSMENT AND PLAN / ED COURSE  Pertinent labs & imaging results that were available during my care of the patient were reviewed by me and considered in my medical decision making (see chart for details).   Patient presents with history as noted above, feeling of palpitations earlier today and chest discomfort as well as sense of constipation.  Denies  abdominal pain.  Admits to increase p.o. intake of water which is likely the cause of his severe hyponatremia noted on labs, sodium is 118.  He will require admission for this,   Lab work otherwise is reassuring, high sensitive troponin is reassuring, EKG is unremarkable.  Will consult the hospitalist for admission    ____________________________________________   FINAL CLINICAL IMPRESSION(S) / ED DIAGNOSES  Final diagnoses:  Acute hyponatremia        Note:  This document was prepared using Dragon voice recognition software and may include unintentional dictation errors.    Jene Every, MD 07/21/21 [phone]

## 2021-07-21 NOTE — ED Notes (Signed)
Pt requesting hospital bed and extra blankets

## 2021-07-22 DIAGNOSIS — E871 Hypo-osmolality and hyponatremia: Secondary | ICD-10-CM | POA: Diagnosis not present

## 2021-07-22 LAB — CBC
HCT: 36.9 % — ABNORMAL LOW (ref 39.0–52.0)
Hemoglobin: 12.3 g/dL — ABNORMAL LOW (ref 13.0–17.0)
MCH: 27.2 pg (ref 26.0–34.0)
MCHC: 33.3 g/dL (ref 30.0–36.0)
MCV: 81.5 fL (ref 80.0–100.0)
Platelets: 220 10*3/uL (ref 150–400)
RBC: 4.53 MIL/uL (ref 4.22–5.81)
RDW: 14.6 % (ref 11.5–15.5)
WBC: 5.8 10*3/uL (ref 4.0–10.5)
nRBC: 0 % (ref 0.0–0.2)

## 2021-07-22 LAB — BASIC METABOLIC PANEL
Anion gap: 6 (ref 5–15)
BUN: 11 mg/dL (ref 8–23)
CO2: 26 mmol/L (ref 22–32)
Calcium: 8.6 mg/dL — ABNORMAL LOW (ref 8.9–10.3)
Chloride: 93 mmol/L — ABNORMAL LOW (ref 98–111)
Creatinine, Ser: 0.64 mg/dL (ref 0.61–1.24)
GFR, Estimated: >60 mL/min (ref >60)
Glucose, Bld: 109 mg/dL — ABNORMAL HIGH (ref 70–99)
Potassium: 4.2 mmol/L (ref 3.5–5.1)
Sodium: 125 mmol/L — ABNORMAL LOW (ref 135–145)

## 2021-07-22 MED ORDER — POLYETHYLENE GLYCOL 3350 17 G PO PACK
17.0000 g | PACK | Freq: Every day | ORAL | Status: DC
  Administered 2021-07-22 – 2021-07-23 (×2): 17 g via ORAL
  Filled 2021-07-22: qty 1

## 2021-07-22 MED ORDER — BUDESONIDE 0.25 MG/2ML IN SUSP
0.2500 mg | Freq: Two times a day (BID) | RESPIRATORY_TRACT | Status: DC
  Administered 2021-07-22 – 2021-07-23 (×3): 0.25 mg via RESPIRATORY_TRACT
  Filled 2021-07-22 (×3): qty 2

## 2021-07-22 MED ORDER — ENOXAPARIN SODIUM 80 MG/0.8ML IJ SOSY
0.5000 mg/kg | PREFILLED_SYRINGE | INTRAMUSCULAR | Status: DC
  Administered 2021-07-22 – 2021-07-23 (×2): 62.5 mg via SUBCUTANEOUS
  Filled 2021-07-22 (×3): qty 0.63

## 2021-07-22 MED ORDER — LORATADINE 10 MG PO TABS
10.0000 mg | ORAL_TABLET | Freq: Every day | ORAL | Status: DC
  Administered 2021-07-22 – 2021-07-23 (×2): 10 mg via ORAL
  Filled 2021-07-22 (×2): qty 1

## 2021-07-22 MED ORDER — POLYETHYLENE GLYCOL 3350 17 G PO PACK
17.0000 g | PACK | Freq: Once | ORAL | Status: AC
Start: 2021-07-22 — End: 2021-07-22
  Administered 2021-07-22: 02:00:00 17 g via ORAL
  Filled 2021-07-22: qty 1

## 2021-07-22 MED ORDER — SODIUM CHLORIDE 0.9 % IV SOLN: 250.0000 mL | INTRAVENOUS | Status: DC | PRN

## 2021-07-22 MED ORDER — PANTOPRAZOLE SODIUM 40 MG PO TBEC
40.0000 mg | DELAYED_RELEASE_TABLET | Freq: Every day | ORAL | Status: DC
  Administered 2021-07-22 – 2021-07-23 (×2): 40 mg via ORAL
  Filled 2021-07-22 (×2): qty 1

## 2021-07-22 MED ORDER — FLUTICASONE PROPIONATE HFA 110 MCG/ACT IN AERO: 2.0000 | INHALATION_SPRAY | Freq: Two times a day (BID) | RESPIRATORY_TRACT | Status: DC

## 2021-07-22 MED ORDER — MONTELUKAST SODIUM 10 MG PO TABS
10.0000 mg | ORAL_TABLET | Freq: Every day | ORAL | Status: DC
  Administered 2021-07-22 (×2): 10 mg via ORAL
  Filled 2021-07-22 (×2): qty 1

## 2021-07-22 MED ORDER — SODIUM CHLORIDE 0.9% FLUSH
3.0000 mL | Freq: Two times a day (BID) | INTRAVENOUS | Status: DC
  Administered 2021-07-22: 3 mL via INTRAVENOUS

## 2021-07-22 MED ORDER — LISINOPRIL 10 MG PO TABS
10.0000 mg | ORAL_TABLET | Freq: Every day | ORAL | Status: DC
  Administered 2021-07-22 – 2021-07-23 (×2): 10 mg via ORAL
  Filled 2021-07-22 (×2): qty 1

## 2021-07-22 MED ORDER — OMEGA-3-ACID ETHYL ESTERS 1 G PO CAPS
1.0000 g | ORAL_CAPSULE | Freq: Every day | ORAL | Status: DC
  Administered 2021-07-22 – 2021-07-23 (×2): 1 g via ORAL
  Filled 2021-07-22 (×2): qty 1

## 2021-07-22 MED ORDER — ATENOLOL 25 MG PO TABS
25.0000 mg | ORAL_TABLET | Freq: Two times a day (BID) | ORAL | Status: DC
  Administered 2021-07-22 – 2021-07-23 (×3): 25 mg via ORAL
  Filled 2021-07-22 (×4): qty 1

## 2021-07-22 MED ORDER — HYOSCYAMINE SULFATE ER 0.375 MG PO TB12
0.3750 mg | ORAL_TABLET | Freq: Two times a day (BID) | ORAL | Status: DC
  Administered 2021-07-22 – 2021-07-23 (×4): 0.375 mg via ORAL
  Filled 2021-07-22 (×6): qty 1

## 2021-07-22 MED ORDER — SODIUM CHLORIDE 0.9% FLUSH: 3.0000 mL | INTRAVENOUS | Status: DC | PRN

## 2021-07-22 MED ORDER — ASCORBIC ACID 500 MG PO TABS
1000.0000 mg | ORAL_TABLET | Freq: Every day | ORAL | Status: DC
  Administered 2021-07-22 – 2021-07-23 (×2): 1000 mg via ORAL
  Filled 2021-07-22 (×2): qty 2

## 2021-07-22 MED ORDER — MAGNESIUM HYDROXIDE 400 MG/5ML PO SUSP
30.0000 mL | Freq: Every day | ORAL | Status: DC | PRN
  Filled 2021-07-22: qty 30

## 2021-07-22 MED ORDER — ALBUTEROL SULFATE (2.5 MG/3ML) 0.083% IN NEBU: 2.5000 mg | INHALATION_SOLUTION | Freq: Four times a day (QID) | RESPIRATORY_TRACT | Status: DC | PRN

## 2021-07-22 MED ORDER — SENNOSIDES-DOCUSATE SODIUM 8.6-50 MG PO TABS
1.0000 | ORAL_TABLET | Freq: Every day | ORAL | Status: DC
  Administered 2021-07-22: 1 via ORAL
  Filled 2021-07-22: qty 1

## 2021-07-22 MED ORDER — FOLIC ACID 1 MG PO TABS
1.0000 mg | ORAL_TABLET | Freq: Every day | ORAL | Status: DC
  Administered 2021-07-22 – 2021-07-23 (×2): 1 mg via ORAL
  Filled 2021-07-22 (×2): qty 1

## 2021-07-22 NOTE — Progress Notes (Signed)
Anticoagulation monitoring(Lovenox):  70 yo male ordered Lovenox 40 mg Q24h    Filed Weights   07/21/21 1510 07/22/21 0123  Weight: 132.5 kg (292 lb) 125.9 kg (277 lb 9 oz)   BMI 33.8    Lab Results  Component Value Date   CREATININE 0.71 07/21/2021   CREATININE 0.64 06/14/2021   CREATININE 0.50 (L) 06/13/2021   Estimated Creatinine Clearance: 126.2 mL/min (by C-G formula based on SCr of 0.71 mg/dL). Hemoglobin & Hematocrit     Component Value Date/Time   HGB 13.5 07/21/2021 1510   HCT 39.7 07/21/2021 1510     Per Protocol for Patient with estCrcl > 30 ml/min and BMI > 30, will transition to Lovenox 62.5 mg Q24h.

## 2021-07-22 NOTE — Evaluation (Signed)
Physical Therapy Evaluation Patient Details Name: Carl [name] MRN: [id number] DOB: [birth year] Today's Date: 07/22/2021  History of Present Illness  Carl [name] is a 70 y.o. male with medical history significant for past medical history significant for anemia, Asperger's syndrome, asthma, atrial fibrillation not on any anticoagulation, GERD on Protonix , hypertension, psychogenic polydipsia and perceived constipation who presented to the emergency department with chest pain that started at home but none when she he arrived to the ED also reporting palpitations.   Clinical Impression  Orders received and chart reviewed. Pt agreeable to PT evaluation. Pleasant and talkative throughout. Reports independent with all ADL's/IADL's with no AD. Lives in same residence since previous admission. Pt is independent with STS transfers and ambulation of ~350' with no AD.  Slow but consistent gait pattern requiring wide turns in order to enter/exit doorways. No unsteadiness noted or LOB. Able to cognitively dual task with conversation with consistent gait speed with no deviations. Reports being at current baseline mobility. Pt returned to room in recliner. No acute PT needs or f/u recommendations. PT to sign off.     Recommendations for follow up therapy are one component of a multi-disciplinary discharge planning process, led by the attending physician.  Recommendations may be updated based on patient status, additional functional criteria and insurance authorization.  Follow Up Recommendations No PT follow up    Assistance Recommended at Discharge None  Functional Status Assessment Patient has not had a recent decline in their functional status  Equipment Recommendations  None recommended by PT    Recommendations for Other Services       Precautions / Restrictions Precautions Precautions: None      Mobility  Bed Mobility               General bed mobility comments: NT. IN  recliner pre and post session Patient Response: Cooperative;Flat affect  Transfers Overall transfer level: Independent                      Ambulation/Gait Ambulation/Gait assistance: Independent Gait Distance (Feet): 350 Feet Assistive device: None Gait Pattern/deviations: Step-through pattern       General Gait Details: Slow but consistent step through pattern.  Stairs            Wheelchair Mobility    Modified Rankin (Stroke Patients Only)       Balance                                             Pertinent Vitals/Pain Pain Assessment: No/denies pain    Home Living Family/patient expects to be discharged to:: Private residence Living Arrangements: Alone   Type of Home: House Home Access: Stairs to enter Entrance Stairs-Rails: Right;Left;Can reach both Secretary/administrator of Steps: 2-3   Home Layout: One level Home Equipment: None      Prior Function Prior Level of Function : Independent/Modified Independent                     Hand Dominance        Extremity/Trunk Assessment   Upper Extremity Assessment Upper Extremity Assessment: Overall WFL for tasks assessed    Lower Extremity Assessment Lower Extremity Assessment: Overall WFL for tasks assessed    Cervical / Trunk Assessment Cervical / Trunk Assessment: Kyphotic  Communication   Communication: No difficulties  Cognition  Arousal/Alertness: Awake/alert Behavior During Therapy: WFL for tasks assessed/performed Overall Cognitive Status: Within Functional Limits for tasks assessed                                 General Comments: Conversation appropriate. Requires frequent redirection to stay on task, very conversational.        General Comments      Exercises Other Exercises Other Exercises: Role of PT in acute setting.   Assessment/Plan    PT Assessment Patient does not need any further PT services  PT Problem List  Decreased mobility       PT Treatment Interventions      PT Goals (Current goals can be found in the Care Plan section)  Acute Rehab PT Goals PT Goal Formulation: All assessment and education complete, DC therapy    Frequency     Barriers to discharge        Co-evaluation               AM-PAC PT "6 Clicks" Mobility  Outcome Measure Help needed turning from your back to your side while in a flat bed without using bedrails?: A Little Help needed moving from lying on your back to sitting on the side of a flat bed without using bedrails?: A Little Help needed moving to and from a bed to a chair (including a wheelchair)?: None Help needed standing up from a chair using your arms (e.g., wheelchair or bedside chair)?: None Help needed to walk in hospital room?: None Help needed climbing 3-5 steps with a railing? : A Little 6 Click Score: 21    End of Session   Activity Tolerance: Patient tolerated treatment well Patient left: in chair Nurse Communication: Mobility status PT Visit Diagnosis: Other abnormalities of gait and mobility (R26.89)    Time: 1258-1310 PT Time Calculation (min) (ACUTE ONLY): 12 min   Charges:   PT Evaluation $PT Eval Low Complexity: 1 Low          Cinque Begley M. Fairly IV, PT, DPT Physical Therapist- [place]  [facility]  07/22/2021, 1:40 PM

## 2021-07-22 NOTE — TOC Initial Note (Signed)
Transition of Care [facility]) - Initial/Assessment Note    Patient Details  Name: Carl [name] MRN: [id number] Date of Birth: [birth year]  Transition of Care [facility]) CM/SW Contact:    Hetty Ely, RN Phone Number: 07/22/2021, 1:46 PM  Clinical Narrative:  Transition of Care [facility]) Screening Note   Patient Details  Name: Carl [name] Date of Birth: [birth year]   Transition of Care [facility]) CM/SW Contact:    Hetty Ely, RN Phone Number: 07/22/2021, 1:46 PM    Transition of Care Department [facility]) has reviewed patient and no TOC needs have been identified at this time. We will continue to monitor patient advancement through interdisciplinary progression rounds. If new patient transition needs arise, please place a TOC consult.                    Expected Discharge Plan: Home/Self Care Barriers to Discharge: No Barriers Identified   Patient Goals and CMS Choice Patient states their goals for this hospitalization and ongoing recovery are:: To return home      Expected Discharge Plan and Services Expected Discharge Plan: Home/Self Care       Living arrangements for the past 2 months: Single Family Home                                      Prior Living Arrangements/Services Living arrangements for the past 2 months: Single Family Home Lives with:: Self Patient language and need for interpreter reviewed:: Yes Do you feel safe going back to the place where you live?: Yes      Need for Family Participation in Patient Care: No (Comment) Care giver support system in place?: No (comment)   Criminal Activity/Legal Involvement Pertinent to Current Situation/Hospitalization: No - Comment as needed  Activities of Daily Living Home Assistive Devices/Equipment: None ADL Screening (condition at time of admission) Patient's cognitive ability adequate to safely complete daily activities?: Yes Is the patient deaf or have difficulty hearing?: No Does the  patient have difficulty seeing, even when wearing glasses/contacts?: No Does the patient have difficulty concentrating, remembering, or making decisions?: No Patient able to express need for assistance with ADLs?: Yes Does the patient have difficulty dressing or bathing?: No Independently performs ADLs?: Yes (appropriate for developmental age) Does the patient have difficulty walking or climbing stairs?: No Weakness of Legs: None Weakness of Arms/Hands: None  Permission Sought/Granted Permission sought to share information with : Case Manager                Emotional Assessment Appearance:: Appears stated age Attitude/Demeanor/Rapport: Engaged Affect (typically observed): Accepting Orientation: : Oriented to Self, Oriented to Place, Oriented to  Time, Oriented to Situation Alcohol / Substance Use: Not Applicable Psych Involvement: No (comment)  Admission diagnosis:  Hyponatremia [E87.1] Patient Active Problem List   Diagnosis Date Noted   GERD (gastroesophageal reflux disease)    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   Acute hyponatremia 04/28/2021   Constipation 04/28/2021   Atrial fibrillation (HCC)    Asperger's syndrome    Hyponatremia    Hypertension    Asthma    Postural dizziness with presyncope    Type 2 diabetes mellitus without complication, without long-term current use of insulin (HCC) 06/23/2018   PCP:  Gracelyn Nurse, MD Pharmacy:   [facility] - [place], [place] - [address] [place] [address] Phone: [phone]  Fax: [phone]     Social Determinants of Health (SDOH) Interventions    Readmission Risk Interventions No flowsheet data found.

## 2021-07-22 NOTE — ED Notes (Signed)
Request made for transport to the floor ?

## 2021-07-22 NOTE — Progress Notes (Signed)
PROGRESS NOTE    Carl [name]  WCH:[id number] DOB: Jul 07, 1951 DOA: 07/21/2021 PCP: Gracelyn Nurse, MD    Brief Narrative:  Carl [name] is a 70 y.o. male with medical history significant for past medical history significant for anemia Asperger's syndrome asthma atrial fibrillation not on any anticoagulation GERD on Protonix , hypertension, polydipsia psychogenic polydipsia and perceived constipation who presented to the emergency department with chest pain that started at home but none when she he arrived to the ED also palpitation..  Patient has a history of hyponatremia.  Patient stated that he had been drinking a lot of fluid and that is what he usually does to flush out his bowel because he seems to think that he has not completely emptied his bowels despite the fact that he is making bowel movements.  12/12 Na 125. D/w pt extensively about drinking too much fluid.  Consultants:    Procedures:   Antimicrobials:      Subjective: Has no sob, dizziness, HA, or any other complaints.   Objective: Vitals:   07/22/21 0123 07/22/21 0123 07/22/21 0450 07/22/21 0746  BP:  136/64 (!) 122/58 121/73  Pulse:  (!) 59 60 66  Resp:  16 18 16   Temp:  97.9 F (36.6 C) 98 F (36.7 C) 98 F (36.7 C)  TempSrc:  Oral Oral Oral  SpO2:  99% 96% 93%  Weight: 125.9 kg     Height:        Intake/Output Summary (Last 24 hours) at 07/22/2021 1229 Last data filed at 07/22/2021 1031 Gross per 24 hour  Intake 240 ml  Output --  Net 240 ml   Filed Weights   07/21/21 1510 07/22/21 0123  Weight: 132.5 kg 125.9 kg    Examination:  General exam: Appears calm and comfortable  Respiratory system: Clear to auscultation. Respiratory effort normal. Cardiovascular system: S1 & S2 heard, RRR. No JVD, murmurs, rubs, gallops or clicks.  Gastrointestinal system: Abdomen is nondistended, soft and nontender. No organomegaly or masses felt. Normal bowel sounds heard. Central nervous  system: Alert and awake, grossly intact Extremities:no edema Psychiatry: Mood & affect appropriate.     Data Reviewed: I have personally reviewed following labs and imaging studies  CBC: Recent Labs  Lab 07/21/21 1510 07/22/21 0414  WBC 7.1 5.8  HGB 13.5 12.3*  HCT 39.7 36.9*  MCV 82.2 81.5  PLT 228 220   Basic Metabolic Panel: Recent Labs  Lab 07/21/21 1510 07/22/21 0414  NA 118* 125*  K 4.6 4.2  CL 87* 93*  CO2 25 26  GLUCOSE 146* 109*  BUN 12 11  CREATININE 0.71 0.64  CALCIUM 8.8* 8.6*   GFR: Estimated Creatinine Clearance: 126.2 mL/min (by C-G formula based on SCr of 0.64 mg/dL). Liver Function Tests: No results for input(s): AST, ALT, ALKPHOS, BILITOT, PROT, ALBUMIN in the last 168 hours. No results for input(s): LIPASE, AMYLASE in the last 168 hours. No results for input(s): AMMONIA in the last 168 hours. Coagulation Profile: No results for input(s): INR, PROTIME in the last 168 hours. Cardiac Enzymes: No results for input(s): CKTOTAL, CKMB, CKMBINDEX, TROPONINI in the last 168 hours. BNP (last 3 results) No results for input(s): PROBNP in the last 8760 hours. HbA1C: No results for input(s): HGBA1C in the last 72 hours. CBG: No results for input(s): GLUCAP in the last 168 hours. Lipid Profile: No results for input(s): CHOL, HDL, LDLCALC, TRIG, CHOLHDL, LDLDIRECT in the last 72 hours. Thyroid Function Tests: No results for  input(s): TSH, T4TOTAL, FREET4, T3FREE, THYROIDAB in the last 72 hours. Anemia Panel: No results for input(s): VITAMINB12, FOLATE, FERRITIN, TIBC, IRON, RETICCTPCT in the last 72 hours. Sepsis Labs: No results for input(s): PROCALCITON, LATICACIDVEN in the last 168 hours.  Recent Results (from the past 240 hour(s))  Resp Panel by RT-PCR (Flu A&B, Covid) Nasopharyngeal Swab     Status: None   Collection Time: 07/21/21  5:26 PM   Specimen: Nasopharyngeal Swab; Nasopharyngeal(NP) swabs in vial transport medium  Result Value Ref Range  Status   SARS Coronavirus 2 by RT PCR NEGATIVE NEGATIVE Final    Comment: (NOTE) SARS-CoV-2 target nucleic acids are NOT DETECTED.  The SARS-CoV-2 RNA is generally detectable in upper respiratory specimens during the acute phase of infection. The lowest concentration of SARS-CoV-2 viral copies this assay can detect is 138 copies/mL. A negative result does not preclude SARS-Cov-2 infection and should not be used as the sole basis for treatment or other patient management decisions. A negative result may occur with  improper specimen collection/handling, submission of specimen other than nasopharyngeal swab, presence of viral mutation(s) within the areas targeted by this assay, and inadequate number of viral copies(<138 copies/mL). A negative result must be combined with clinical observations, patient history, and epidemiological information. The expected result is Negative.  Fact Sheet for Patients:  BloggerCourse.com  Fact Sheet for Healthcare Providers:  SeriousBroker.it  This test is no t yet approved or cleared by the [place] FDA and  has been authorized for detection and/or diagnosis of SARS-CoV-2 by FDA under an Emergency Use Authorization (EUA). This EUA will remain  in effect (meaning this test can be used) for the duration of the COVID-19 declaration under Section 564(b)(1) of the Act, 21 U.S.C.section 360bbb-3(b)(1), unless the authorization is terminated  or revoked sooner.       Influenza A by PCR NEGATIVE NEGATIVE Final   Influenza B by PCR NEGATIVE NEGATIVE Final    Comment: (NOTE) The Xpert Xpress SARS-CoV-2/FLU/RSV plus assay is intended as an aid in the diagnosis of influenza from Nasopharyngeal swab specimens and should not be used as a sole basis for treatment. Nasal washings and aspirates are unacceptable for Xpert Xpress SARS-CoV-2/FLU/RSV testing.  Fact Sheet for  Patients: BloggerCourse.com  Fact Sheet for Healthcare Providers: SeriousBroker.it  This test is not yet approved or cleared by the [place] FDA and has been authorized for detection and/or diagnosis of SARS-CoV-2 by FDA under an Emergency Use Authorization (EUA). This EUA will remain in effect (meaning this test can be used) for the duration of the COVID-19 declaration under Section 564(b)(1) of the Act, 21 U.S.C. section 360bbb-3(b)(1), unless the authorization is terminated or revoked.  Performed at [facility], [address] [place], [place] [address]          Radiology Studies: DG Chest 2 View  Result Date: 07/21/2021 CLINICAL DATA:  Chest pain EXAM: CHEST - 2 VIEW COMPARISON:  06/14/2021 FINDINGS: Heart and mediastinal contours are within normal limits. No focal opacities or effusions. No acute bony abnormality. IMPRESSION: No active cardiopulmonary disease. Electronically Signed   By: Charlett Nose M.D.   On: 07/21/2021 15:43        Scheduled Meds:  ascorbic acid  1,000 mg Oral Daily   atenolol  25 mg Oral BID   budesonide (PULMICORT) nebulizer solution  0.25 mg Nebulization BID   enoxaparin (LOVENOX) injection  0.5 mg/kg Subcutaneous Q24H   folic acid  1 mg Oral Daily  hyoscyamine  0.375 mg Oral Q12H   lisinopril  10 mg Oral Daily   loratadine  10 mg Oral Daily   montelukast  10 mg Oral QHS   omega-3 acid ethyl esters  1 g Oral Daily   pantoprazole  40 mg Oral Daily   sodium chloride flush  3 mL Intravenous Q12H   Continuous Infusions:  sodium chloride      Assessment & Plan:   Principal Problem:   Acute hyponatremia Active Problems:   Atrial fibrillation (HCC)   Asperger's syndrome   Hyponatremia   Hypertension   Type 2 diabetes mellitus without complication, without long-term current use of insulin (HCC)   Constipation   Psychogenic polydipsia   GERD (gastroesophageal reflux  disease)   1.  Acute severe hyponatremia.  He is sodium is 118 He has had a history of low sodium his last admission was a couple of months ago with output sodium as low as 115. 12/12 Na 125 with fluid restriction Continue with current mx Asx , monitor   2.  Psychogenic polydipsia Discussed with pt about excessive hydration , but feels that's the only thing getting his "constipation" going.  3.  Obsession with constipation. Place on bowel regimen he can also take at home D/w pt about this.  4.  Chest pain/palpitation Asx now.  Tp negative Will ck echo  5.  History of atrial fibrillation  Not on a/c EKG SR here  6.  Other electrolyte abnormality is calcium is slightly low at 8.8 Possibly dilutional with polydipsia Continue to monitor   7.  Hypertension  Stable continue beta blk  DVT prophylaxis: lovenox Code Status:full Family Communication: none at bedside Disposition Plan:  Status is: Observation  The patient remains OBS appropriate and will d/c before 2 midnights.           LOS: 0 days   Time spent: 45 min with >50% on coc and counseling pt    Lynn Ito, MD [facility] Pager [phone]  If 7PM-7AM, please contact night-coverage 07/22/2021, 12:29 PM

## 2021-07-23 DIAGNOSIS — E871 Hypo-osmolality and hyponatremia: Secondary | ICD-10-CM | POA: Diagnosis not present

## 2021-07-23 LAB — SODIUM: Sodium: 127 mmol/L — ABNORMAL LOW (ref 135–145)

## 2021-07-23 MED ORDER — POLYETHYLENE GLYCOL 3350 17 G PO PACK: 17.0000 g | PACK | Freq: Every day | ORAL | 0 refills | Status: AC

## 2021-07-23 MED ORDER — SENNOSIDES-DOCUSATE SODIUM 8.6-50 MG PO TABS: 1.0000 | ORAL_TABLET | Freq: Every day | ORAL | 0 refills | Status: AC

## 2021-07-23 NOTE — Discharge Summary (Signed)
Carl [name]:[id number] DOB: [birth year] DOA: 07/21/2021  PCP: Gracelyn Nurse, MD  Admit date: 07/21/2021 Discharge date: 07/23/2021  Admitted From: home Disposition:  home  Recommendations for Outpatient Follow-up:  Follow up with PCP in 1 week Please obtain BMP/CBC in one week      Discharge Condition:Stable CODE STATUS:full  Diet recommendation: Heart Healthy / Carb Modified   Brief/Interim Summary: Per HPI: Carl [name] is a 70 y.o. male with medical history significant for past medical history significant for anemia Asperger's syndrome asthma atrial fibrillation not on any anticoagulation GERD on Protonix , hypertension, polydipsia psychogenic polydipsia and perceived constipation who presented to the emergency department with chest pain that started at home but none when she he arrived to the ED also palpitation..  Patient has a history of hyponatremia.  Patient stated that he had been drinking a lot of fluid and that is what he usually does to flush out his bowel because he seems to think that he has not completely emptied his bowels despite the fact that he is making bowel movements.  Patient denied chest pain now he said however he is feeling some discomfort in the epigastric/upper abdomen like his stomach is upset a little.Had  a bowel movement on the day of admission, but he felt the need to do more bowel movement and that he is dehydrated and when he is dehydrated he is constipated and has to drink a lot of water.Marland Kitchen  He said he is dehydrated now despite the fact that he has been drinking a lot of water. EKG was normal sinus without any ischemic ST changes.  Troponins were negative.  Patient was found with sodium of 118.  Patient was admitted.     1.  Acute severe hyponatremia.   Sodium of 118 on admission  Patient was placed on fluid restriction with improvement of his sodium levels  He remained asymptomatic  Today sodium is 127  Needs to follow-up  with PCP for further monitoring.   2.  Psychogenic polydipsia Discussed with pt about excessive hydration , but feels that's the only thing getting his "constipation" going. Counseled patient about this today again.  3.  Obsession with constipation. Continue with bowel regimen and home Counseled patient about increasing fiber intake .   4.  Chest pain/palpitation Ekg nonischemic. TP negative Asx. Likely due to constipation.  Tele sr.  5.  History of atrial fibrillation  Not on a/c EKG SR   6.  Other electrolyte abnormality is calcium is slightly low at 8.8 Possibly dilutional with polydipsia F/u with pcp    7.  Hypertension  Continue ACEI and beta blocker as bp stable here .      Discharge Diagnoses:  Principal Problem:   Acute hyponatremia Active Problems:   Atrial fibrillation (HCC)   Asperger's syndrome   Hyponatremia   Hypertension   Type 2 diabetes mellitus without complication, without long-term current use of insulin (HCC)   Constipation   Psychogenic polydipsia   GERD (gastroesophageal reflux disease)    Discharge Instructions  Discharge Instructions     Diet - low sodium heart healthy   Complete by: As directed    Discharge instructions   Complete by: As directed    Increase fiber in diet.   Increase activity slowly   Complete by: As directed       Allergies as of 07/23/2021   No Known Allergies      Medication List     STOP taking  these medications    amLODipine 5 MG tablet Commonly known as: NORVASC   naproxen sodium 220 MG tablet Commonly known as: ALEVE       TAKE these medications    albuterol 108 (90 Base) MCG/ACT inhaler Commonly known as: VENTOLIN HFA Inhale 2 puffs into the lungs every 6 (six) hours as needed for wheezing.   ascorbic acid 1000 MG tablet Commonly known as: VITAMIN C Take 1,000 mg by mouth daily.   atenolol 25 MG tablet Commonly known as: TENORMIN Take by mouth 2 (two) times daily.   B  COMPLEX PO Take 1 tablet by mouth daily.   cyclobenzaprine 5 MG tablet Commonly known as: FLEXERIL Take 1 tablet (5 mg total) by mouth 3 (three) times daily as needed for muscle spasms.   fluticasone 110 MCG/ACT inhaler Commonly known as: FLOVENT HFA Inhale 1 puff into the lungs in the morning and at bedtime.   folic acid 1 MG tablet Commonly known as: FOLVITE Take 1 mg by mouth daily.   Garlic 100 MG Tabs Take 100 mg by mouth daily.   hyoscyamine 0.375 MG 12 hr tablet Commonly known as: LEVBID Take 1 tablet by mouth in the morning and at bedtime.   lisinopril 10 MG tablet Commonly known as: ZESTRIL Take 10 mg by mouth daily.   montelukast 10 MG tablet Commonly known as: SINGULAIR Take 10 mg by mouth at bedtime.   multivitamin tablet Take 1 tablet by mouth daily.   pantoprazole 40 MG tablet Commonly known as: PROTONIX Take 40 mg by mouth daily.   polyethylene glycol 17 g packet Commonly known as: MIRALAX / GLYCOLAX Take 17 g by mouth daily for 14 days. Start taking on: July 24, 2021   QC TUMERIC COMPLEX PO Take 1 tablet by mouth daily.   SALMON OIL PO Take 1 capsule by mouth daily.   senna-docusate 8.6-50 MG tablet Commonly known as: Senokot-S Take 1 tablet by mouth at bedtime.   Zinc Citrate-Phytase 25-500 MG Caps Take 1 capsule by mouth daily.        Follow-up Information     Gracelyn Nurse, MD Follow up.   Specialty: Internal Medicine Contact information: [address] [place] [address] [phone]                No Known Allergies  Consultations:    Procedures/Studies: DG Chest 2 View  Result Date: 07/21/2021 CLINICAL DATA:  Chest pain EXAM: CHEST - 2 VIEW COMPARISON:  06/14/2021 FINDINGS: Heart and mediastinal contours are within normal limits. No focal opacities or effusions. No acute bony abnormality. IMPRESSION: No active cardiopulmonary disease. Electronically Signed   By: Charlett Nose M.D.   On:  07/21/2021 15:43      Subjective: Chest pain, no shortness of breath, no dizziness or lightheaded  Discharge Exam: Vitals:   07/23/21 0110 07/23/21 0541  BP: (!) 156/99 113/72  Pulse: 64 (!) 55  Resp: 20 20  Temp: 98.2 F (36.8 C) (!) 97.5 F (36.4 C)  SpO2: 95% 93%   Vitals:   07/22/21 1934 07/22/21 2045 07/23/21 0110 07/23/21 0541  BP:  121/69 (!) 156/99 113/72  Pulse:  (!) 59 64 (!) 55  Resp:  Temp:  (!) 97.3 F (36.3 C) 98.2 F (36.8 C) (!) 97.5 F (36.4 C)  TempSrc:  Oral Oral Oral  SpO2: 95% 97% 95% 93%  Weight:      Height:  General: Pt is alert, awake, oriented x3 not in acute distress Cardiovascular: RRR, S1/S2 +, no rubs, no gallops Respiratory: CTA bilaterally, no wheezing, no rhonchi Abdominal: Soft, NT, ND, bowel sounds + Extremities: no edema, no cyanosis    The results of significant diagnostics from this hospitalization (including imaging, microbiology, ancillary and laboratory) are listed below for reference.     Microbiology: Recent Results (from the past 240 hour(s))  Resp Panel by RT-PCR (Flu A&B, Covid) Nasopharyngeal Swab     Status: None   Collection Time: 07/21/21  5:26 PM   Specimen: Nasopharyngeal Swab; Nasopharyngeal(NP) swabs in vial transport medium  Result Value Ref Range Status   SARS Coronavirus 2 by RT PCR NEGATIVE NEGATIVE Final    Comment: (NOTE) SARS-CoV-2 target nucleic acids are NOT DETECTED.  The SARS-CoV-2 RNA is generally detectable in upper respiratory specimens during the acute phase of infection. The lowest concentration of SARS-CoV-2 viral copies this assay can detect is 138 copies/mL. A negative result does not preclude SARS-Cov-2 infection and should not be used as the sole basis for treatment or other patient management decisions. A negative result may occur with  improper specimen collection/handling, submission of specimen other than nasopharyngeal swab, presence of viral mutation(s) within  the areas targeted by this assay, and inadequate number of viral copies(<138 copies/mL). A negative result must be combined with clinical observations, patient history, and epidemiological information. The expected result is Negative.  Fact Sheet for Patients:  BloggerCourse.com  Fact Sheet for Healthcare Providers:  SeriousBroker.it  This test is no t yet approved or cleared by the [place] FDA and  has been authorized for detection and/or diagnosis of SARS-CoV-2 by FDA under an Emergency Use Authorization (EUA). This EUA will remain  in effect (meaning this test can be used) for the duration of the COVID-19 declaration under Section 564(b)(1) of the Act, 21 U.S.C.section 360bbb-3(b)(1), unless the authorization is terminated  or revoked sooner.       Influenza A by PCR NEGATIVE NEGATIVE Final   Influenza B by PCR NEGATIVE NEGATIVE Final    Comment: (NOTE) The Xpert Xpress SARS-CoV-2/FLU/RSV plus assay is intended as an aid in the diagnosis of influenza from Nasopharyngeal swab specimens and should not be used as a sole basis for treatment. Nasal washings and aspirates are unacceptable for Xpert Xpress SARS-CoV-2/FLU/RSV testing.  Fact Sheet for Patients: BloggerCourse.com  Fact Sheet for Healthcare Providers: SeriousBroker.it  This test is not yet approved or cleared by the [place] FDA and has been authorized for detection and/or diagnosis of SARS-CoV-2 by FDA under an Emergency Use Authorization (EUA). This EUA will remain in effect (meaning this test can be used) for the duration of the COVID-19 declaration under Section 564(b)(1) of the Act, 21 U.S.C. section 360bbb-3(b)(1), unless the authorization is terminated or revoked.  Performed at [facility], [address] [place], [place] [address]      Labs: BNP (last 3 results) Recent Labs     06/13/21 2003  BNP 79.3   Basic Metabolic Panel: Recent Labs  Lab 07/21/21 1510 07/22/21 0414 07/23/21 0853  NA 118* 125* 127*  K 4.6 4.2  --   CL 87* 93*  --   CO2 25 26  --   GLUCOSE 146* 109*  --   BUN 12 11  --   CREATININE 0.71 0.64  --   CALCIUM 8.8* 8.6*  --    Liver Function Tests: No results for input(s): AST, ALT, ALKPHOS, BILITOT, PROT,  ALBUMIN in the last 168 hours. No results for input(s): LIPASE, AMYLASE in the last 168 hours. No results for input(s): AMMONIA in the last 168 hours. CBC: Recent Labs  Lab 07/21/21 1510 07/22/21 0414  WBC 7.1 5.8  HGB 13.5 12.3*  HCT 39.7 36.9*  MCV 82.2 81.5  PLT 228 220   Cardiac Enzymes: No results for input(s): CKTOTAL, CKMB, CKMBINDEX, TROPONINI in the last 168 hours. BNP: Invalid input(s): POCBNP CBG: No results for input(s): GLUCAP in the last 168 hours. D-Dimer No results for input(s): DDIMER in the last 72 hours. Hgb A1c No results for input(s): HGBA1C in the last 72 hours. Lipid Profile No results for input(s): CHOL, HDL, LDLCALC, TRIG, CHOLHDL, LDLDIRECT in the last 72 hours. Thyroid function studies No results for input(s): TSH, T4TOTAL, T3FREE, THYROIDAB in the last 72 hours.  Invalid input(s): FREET3 Anemia work up No results for input(s): VITAMINB12, FOLATE, FERRITIN, TIBC, IRON, RETICCTPCT in the last 72 hours. Urinalysis    Component Value Date/Time   COLORURINE COLORLESS (A) 06/13/2021 1852   APPEARANCEUR CLEAR (A) 06/13/2021 1852   LABSPEC 1.001 (L) 06/13/2021 1852   PHURINE 8.0 06/13/2021 1852   GLUCOSEU NEGATIVE 06/13/2021 1852   HGBUR NEGATIVE 06/13/2021 1852   BILIRUBINUR NEGATIVE 06/13/2021 1852   KETONESUR NEGATIVE 06/13/2021 1852   PROTEINUR NEGATIVE 06/13/2021 1852   NITRITE NEGATIVE 06/13/2021 1852   LEUKOCYTESUR NEGATIVE 06/13/2021 1852   Sepsis Labs Invalid input(s): PROCALCITONIN,  WBC,  LACTICIDVEN Microbiology Recent Results (from the past 240 hour(s))  Resp Panel by  RT-PCR (Flu A&B, Covid) Nasopharyngeal Swab     Status: None   Collection Time: 07/21/21  5:26 PM   Specimen: Nasopharyngeal Swab; Nasopharyngeal(NP) swabs in vial transport medium  Result Value Ref Range Status   SARS Coronavirus 2 by RT PCR NEGATIVE NEGATIVE Final    Comment: (NOTE) SARS-CoV-2 target nucleic acids are NOT DETECTED.  The SARS-CoV-2 RNA is generally detectable in upper respiratory specimens during the acute phase of infection. The lowest concentration of SARS-CoV-2 viral copies this assay can detect is 138 copies/mL. A negative result does not preclude SARS-Cov-2 infection and should not be used as the sole basis for treatment or other patient management decisions. A negative result may occur with  improper specimen collection/handling, submission of specimen other than nasopharyngeal swab, presence of viral mutation(s) within the areas targeted by this assay, and inadequate number of viral copies(<138 copies/mL). A negative result must be combined with clinical observations, patient history, and epidemiological information. The expected result is Negative.  Fact Sheet for Patients:  BloggerCourse.com  Fact Sheet for Healthcare Providers:  SeriousBroker.it  This test is no t yet approved or cleared by the [place] FDA and  has been authorized for detection and/or diagnosis of SARS-CoV-2 by FDA under an Emergency Use Authorization (EUA). This EUA will remain  in effect (meaning this test can be used) for the duration of the COVID-19 declaration under Section 564(b)(1) of the Act, 21 U.S.C.section 360bbb-3(b)(1), unless the authorization is terminated  or revoked sooner.       Influenza A by PCR NEGATIVE NEGATIVE Final   Influenza B by PCR NEGATIVE NEGATIVE Final    Comment: (NOTE) The Xpert Xpress SARS-CoV-2/FLU/RSV plus assay is intended as an aid in the diagnosis of influenza from Nasopharyngeal swab  specimens and should not be used as a sole basis for treatment. Nasal washings and aspirates are unacceptable for Xpert Xpress SARS-CoV-2/FLU/RSV testing.  Fact Sheet for Patients: BloggerCourse.com  Fact  Sheet for Healthcare Providers: SeriousBroker.it  This test is not yet approved or cleared by the [place] and has been authorized for detection and/or diagnosis of SARS-CoV-2 by FDA under an Emergency Use Authorization (EUA). This EUA will remain in effect (meaning this test can be used) for the duration of the COVID-19 declaration under Section 564(b)(1) of the Act, 21 U.S.C. section 360bbb-3(b)(1), unless the authorization is terminated or revoked.  Performed at [facility], [address], [place], [place] [address]      Time coordinating discharge: Over 30 minutes  SIGNED:   Lynn Ito, MD  [facility] 07/23/2021, 10:59 AM Pager   If 7PM-7AM, please contact night-coverage [email or web address] Password [email or web address]

## 2021-07-23 NOTE — Care Management Obs Status (Signed)
MEDICARE OBSERVATION STATUS NOTIFICATION   Patient Details  Name: Carl [name] MRN: [id number] Date of Birth: [birth year]   Medicare Observation Status Notification Given:  Yes    Margarito Liner, LCSW 07/23/2021, 10:38 AM

## 2021-07-23 NOTE — TOC Transition Note (Addendum)
Transition of Care [facility]) - CM/SW Discharge Note   Patient Details  Name: Carl [name] MRN: [id number] Date of Birth: 06-02-1951  [facility] of [facility]) CM/SW Contact:  Margarito Liner, LCSW Phone Number: 07/23/2021, 11:22 AM   Clinical Narrative:   Patient has orders to discharge home today. PCP is Marcelino Duster, MD. On room air. No wounds. No TOC needs identified. CSW signing off.  3:53 pm: [employer] transport arranged. Address on facesheet is correct. No further concerns. CSW signing off.  Final next level of care: Home/Self Care Barriers to Discharge: Barriers Resolved   Patient Goals and CMS Choice Patient states their goals for this hospitalization and ongoing recovery are:: To return home      Discharge Placement                    Patient and family notified of of transfer: 07/23/21  Discharge Plan and Services                                     Social Determinants of Health (SDOH) Interventions     Readmission Risk Interventions No flowsheet data found.

## 2021-10-08 ENCOUNTER — Encounter: Payer: Self-pay | Admitting: Emergency Medicine

## 2021-10-08 ENCOUNTER — Inpatient Hospital Stay
Admission: EM | Admit: 2021-10-08 | Discharge: 2021-10-15 | DRG: 641 | Disposition: A | Discharge status: Home / Self Care | Payer: Medicare Other | Attending: Internal Medicine | Admitting: Internal Medicine

## 2021-10-08 ENCOUNTER — Other Ambulatory Visit: Payer: Self-pay

## 2021-10-08 DIAGNOSIS — I4891 Unspecified atrial fibrillation: Secondary | ICD-10-CM | POA: Diagnosis not present

## 2021-10-08 DIAGNOSIS — I480 Paroxysmal atrial fibrillation: Secondary | ICD-10-CM | POA: Diagnosis present

## 2021-10-08 DIAGNOSIS — E119 Type 2 diabetes mellitus without complications: Secondary | ICD-10-CM | POA: Diagnosis present

## 2021-10-08 DIAGNOSIS — J45909 Unspecified asthma, uncomplicated: Secondary | ICD-10-CM | POA: Diagnosis present

## 2021-10-08 DIAGNOSIS — R631 Polydipsia: Secondary | ICD-10-CM | POA: Diagnosis present

## 2021-10-08 DIAGNOSIS — F54 Psychological and behavioral factors associated with disorders or diseases classified elsewhere: Secondary | ICD-10-CM

## 2021-10-08 DIAGNOSIS — I959 Hypotension, unspecified: Secondary | ICD-10-CM | POA: Diagnosis present

## 2021-10-08 DIAGNOSIS — K219 Gastro-esophageal reflux disease without esophagitis: Secondary | ICD-10-CM | POA: Diagnosis present

## 2021-10-08 DIAGNOSIS — Z20822 Contact with and (suspected) exposure to covid-19: Secondary | ICD-10-CM | POA: Diagnosis present

## 2021-10-08 DIAGNOSIS — E8779 Other fluid overload: Secondary | ICD-10-CM | POA: Diagnosis present

## 2021-10-08 DIAGNOSIS — K5790 Diverticulosis of intestine, part unspecified, without perforation or abscess without bleeding: Secondary | ICD-10-CM | POA: Diagnosis present

## 2021-10-08 DIAGNOSIS — Z79899 Other long term (current) drug therapy: Secondary | ICD-10-CM | POA: Diagnosis not present

## 2021-10-08 DIAGNOSIS — I10 Essential (primary) hypertension: Secondary | ICD-10-CM | POA: Diagnosis present

## 2021-10-08 DIAGNOSIS — F845 Asperger's syndrome: Secondary | ICD-10-CM | POA: Diagnosis present

## 2021-10-08 DIAGNOSIS — E871 Hypo-osmolality and hyponatremia: Secondary | ICD-10-CM | POA: Diagnosis present

## 2021-10-08 LAB — BASIC METABOLIC PANEL
Anion gap: 9 (ref 5–15)
BUN: 11 mg/dL (ref 8–23)
CO2: 25 mmol/L (ref 22–32)
Calcium: 8.4 mg/dL — ABNORMAL LOW (ref 8.9–10.3)
Chloride: 75 mmol/L — ABNORMAL LOW (ref 98–111)
Creatinine, Ser: 0.48 mg/dL — ABNORMAL LOW (ref 0.61–1.24)
GFR, Estimated: >60 mL/min (ref >60)
Glucose, Bld: 194 mg/dL — ABNORMAL HIGH (ref 70–99)
Potassium: 4.5 mmol/L (ref 3.5–5.1)
Sodium: 109 mmol/L — CL (ref 135–145)

## 2021-10-08 LAB — SODIUM: Sodium: 114 mmol/L — CL (ref 135–145)

## 2021-10-08 LAB — URINALYSIS, ROUTINE W REFLEX MICROSCOPIC
Bacteria, UA: NONE SEEN
Bilirubin Urine: NEGATIVE
Glucose, UA: >=500 mg/dL — AB
Ketones, ur: NEGATIVE mg/dL
Leukocytes,Ua: NEGATIVE
Nitrite: NEGATIVE
Protein, ur: NEGATIVE mg/dL
Specific Gravity, Urine: 1 — ABNORMAL LOW (ref 1.005–1.030)
Squamous Epithelial / HPF: NONE SEEN (ref 0–5)
pH: 8 (ref 5.0–8.0)

## 2021-10-08 LAB — MAGNESIUM: Magnesium: 1.9 mg/dL (ref 1.7–2.4)

## 2021-10-08 LAB — CBC
HCT: 40.1 % (ref 39.0–52.0)
Hemoglobin: 13.8 g/dL (ref 13.0–17.0)
MCH: 27.2 pg (ref 26.0–34.0)
MCHC: 34.4 g/dL (ref 30.0–36.0)
MCV: 79.1 fL — ABNORMAL LOW (ref 80.0–100.0)
Platelets: 210 10*3/uL (ref 150–400)
RBC: 5.07 MIL/uL (ref 4.22–5.81)
RDW: 13.9 % (ref 11.5–15.5)
WBC: 5 10*3/uL (ref 4.0–10.5)
nRBC: 0 % (ref 0.0–0.2)

## 2021-10-08 LAB — TROPONIN I (HIGH SENSITIVITY)
Troponin I (High Sensitivity): 6 ng/L (ref <18)
Troponin I (High Sensitivity): 7 ng/L (ref <18)

## 2021-10-08 LAB — SODIUM, URINE, RANDOM: Sodium, Ur: 23 mmol/L

## 2021-10-08 LAB — OSMOLALITY, URINE: Osmolality, Ur: 113 mOsm/kg — ABNORMAL LOW (ref 300–900)

## 2021-10-08 LAB — OSMOLALITY: Osmolality: 247 mOsm/kg — CL (ref 275–295)

## 2021-10-08 MED ORDER — DOCUSATE SODIUM 100 MG PO CAPS
100.0000 mg | ORAL_CAPSULE | Freq: Two times a day (BID) | ORAL | Status: DC | PRN
  Administered 2021-10-11: 100 mg via ORAL
  Filled 2021-10-08: qty 1

## 2021-10-08 MED ORDER — PANTOPRAZOLE SODIUM 40 MG PO TBEC
40.0000 mg | DELAYED_RELEASE_TABLET | Freq: Every day | ORAL | Status: DC
  Administered 2021-10-09 – 2021-10-15 (×7): 40 mg via ORAL
  Filled 2021-10-08 (×7): qty 1

## 2021-10-08 MED ORDER — ASCORBIC ACID 500 MG PO TABS
1000.0000 mg | ORAL_TABLET | Freq: Every day | ORAL | Status: DC
  Administered 2021-10-09 – 2021-10-15 (×7): 1000 mg via ORAL
  Filled 2021-10-08 (×7): qty 2

## 2021-10-08 MED ORDER — ALBUTEROL SULFATE (2.5 MG/3ML) 0.083% IN NEBU: 2.5000 mg | INHALATION_SOLUTION | Freq: Four times a day (QID) | RESPIRATORY_TRACT | Status: DC | PRN

## 2021-10-08 MED ORDER — ENOXAPARIN SODIUM 80 MG/0.8ML IJ SOSY
0.5000 mg/kg | PREFILLED_SYRINGE | INTRAMUSCULAR | Status: DC
  Administered 2021-10-09 (×2): 65 mg via SUBCUTANEOUS
  Filled 2021-10-08 (×2): qty 0.65
  Filled 2021-10-08: qty 0.8

## 2021-10-08 MED ORDER — DIAZEPAM 5 MG/ML IJ SOLN
2.5000 mg | Freq: Four times a day (QID) | INTRAMUSCULAR | Status: DC | PRN
  Administered 2021-10-09: 2.5 mg via INTRAVENOUS
  Filled 2021-10-08: qty 2

## 2021-10-08 MED ORDER — ATENOLOL 25 MG PO TABS
25.0000 mg | ORAL_TABLET | Freq: Two times a day (BID) | ORAL | Status: DC
  Administered 2021-10-09: 25 mg via ORAL
  Filled 2021-10-08: qty 1

## 2021-10-08 MED ORDER — POLYETHYLENE GLYCOL 3350 17 G PO PACK
17.0000 g | PACK | Freq: Every day | ORAL | Status: DC | PRN
  Administered 2021-10-11: 17 g via ORAL
  Filled 2021-10-08: qty 1

## 2021-10-08 MED ORDER — ONDANSETRON HCL 4 MG/2ML IJ SOLN
4.0000 mg | Freq: Once | INTRAMUSCULAR | Status: AC
  Administered 2021-10-08: 4 mg via INTRAVENOUS
  Filled 2021-10-08: qty 2

## 2021-10-08 MED ORDER — FOLIC ACID 1 MG PO TABS
1.0000 mg | ORAL_TABLET | Freq: Every day | ORAL | Status: DC
  Administered 2021-10-09 – 2021-10-15 (×7): 1 mg via ORAL
  Filled 2021-10-08 (×7): qty 1

## 2021-10-08 MED ORDER — MORPHINE SULFATE (PF) 4 MG/ML IV SOLN
4.0000 mg | Freq: Once | INTRAVENOUS | Status: AC
  Administered 2021-10-08: 4 mg via INTRAVENOUS
  Filled 2021-10-08: qty 1

## 2021-10-08 MED ORDER — LISINOPRIL 5 MG PO TABS: 10.0000 mg | ORAL_TABLET | Freq: Every day | ORAL | Status: DC

## 2021-10-08 MED ORDER — FLUTICASONE PROPIONATE HFA 110 MCG/ACT IN AERO: 1.0000 | INHALATION_SPRAY | Freq: Two times a day (BID) | RESPIRATORY_TRACT | Status: DC

## 2021-10-08 MED ORDER — SODIUM CHLORIDE 3 % IV SOLN
INTRAVENOUS | Status: DC
  Filled 2021-10-08: qty 500

## 2021-10-08 NOTE — ED Notes (Signed)
Lab reports Na 109; acuity level changed; Dr Erma Heritage and care nurse Duaine Dredge RN notified; pt taken to room 5 via w/c by EDT John to be placed on card monitor for further eval

## 2021-10-08 NOTE — Progress Notes (Signed)
Responded to IV consult. Per RN, current IV is adequate; no additional access needed at this time.

## 2021-10-08 NOTE — H&P (Addendum)
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  Patient report drinking large amount of water this afternoon due to sense of incomplete bowel movement. On review of his chart, he was admitted on 04/2021, 06/2021 and 12/11 for hyponatremia secondary to psychogenic polydipsia. He was placed on fluid restriction with improvement of his sodium levels at discharge. He continues to drink excessive amount of water despite multiple admissions and recommendation to restrict free water intake.  ED Course: In the emergency department, the temperature was 36.5 C, the heart rate 71 beats/minute, the blood pressure  165/71 mm Hg, the respiratory rate 18 breaths/minute, and the oxygen saturation 100 % on RA. Pertinent Labs in Red/Diagnostics Findings: Na+/ K+: 109/4.5 Glucose: 194 BUN/Cr.:11/0.48 Calcium: 8.4 Serum Osmolality: 247 Urine Osmolality:113  COVID PCR: Negative Urinalysis: Spec gravity 1.0, Glucose>500  Due to severe hyponatremia as above, Nephrology was consulted who recommended to start hypertonic solution 3% at 25ml/hr with frequent sodium checks. PCCM consulted to admit to the ICU for closer monitoring.  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 rate (!) 9, height 6\' 4"  (1.93 m), weight 131.5 kg, SpO2 95 %.        Intake/Output Summary (Last 24 hours) at 10/08/2021 2254 Last data filed at 10/08/2021 2219 Gross per 24 hour  Intake --  Output 2300 ml  Net -2300 ml   Filed Weights   10/08/21 1930  Weight: 131.5 kg   Physical Examination  GENERAL: 71 year-old critically ill patient lying in the bed with no acute distress.  EYES: Pupils equal, round, reactive to light and accommodation. No scleral icterus. Extraocular muscles intact.  HEENT: Head atraumatic, normocephalic. Oropharynx and nasopharynx clear.  NECK:  Supple, no jugular venous distention. No thyroid enlargement, no tenderness.  LUNGS: Normal breath sounds bilaterally, no wheezing, rales,rhonchi or crepitation. No use of accessory muscles of respiration.  CARDIOVASCULAR: S1, S2 normal. No murmurs, rubs, or gallops.  ABDOMEN: Soft, nontender, distended. Bowel sounds present. No organomegaly or mass.  EXTREMITIES: Bilateral pitting edema, No cyanosis, or clubbing.  NEUROLOGIC: Cranial nerves II through XII are intact.  Muscle strength 4/5 in upper extremities, breaks gravity in lower extremities. Sensation intact. Gait not checked.  PSYCHIATRIC: The patient is alert and oriented x 3.  SKIN: No obvious rash, lesion, or ulcer.   Labs/imaging that I havepersonally reviewed  (right click and "Reselect all SmartList Selections" daily)     Labs   CBC: Recent Labs  Lab 10/08/21 1933  WBC 5.0  HGB 13.8  HCT 40.1  MCV 79.1*  PLT 210  Basic Metabolic Panel: Recent Labs  Lab 10/08/21 1933 10/08/21 2104  NA 109*  --   K 4.5  --   CL 75*  --   CO2 25  --   GLUCOSE 194*  --    BUN 11  --   CREATININE 0.48*  --   CALCIUM 8.4*  --   MG  --  1.9   GFR: Estimated Creatinine Clearance: 129.1 mL/min (A) (by C-G formula based on SCr of 0.48 mg/dL (L)). Recent Labs  Lab 10/08/21 1933  WBC 5.0    Liver Function Tests: No results for input(s): AST, ALT, ALKPHOS, BILITOT, PROT, ALBUMIN in the last 168 hours. No results for input(s): LIPASE, AMYLASE in the last 168 hours. No results for input(s): AMMONIA in the last 168 hours.  ABG No results found for: PHART, PCO2ART, PO2ART, HCO3, TCO2, ACIDBASEDEF, O2SAT   Coagulation Profile: No results for input(s): INR, PROTIME in the last 168 hours.  Cardiac Enzymes: No results for input(s): CKTOTAL, CKMB, CKMBINDEX, TROPONINI in the last 168 hours.  HbA1C: No results found for: HGBA1C  CBG: No results for input(s): GLUCAP in the last 168 hours.  Review of Systems:   Review of Systems  Constitutional:  Positive for malaise/fatigue.  HENT: Negative.    Eyes: Negative.   Respiratory: Negative.    Cardiovascular:  Positive for leg swelling.  Gastrointestinal: Negative.   Genitourinary:  Positive for frequency.  Musculoskeletal: Negative.   Skin:  Positive for rash.  Neurological:  Positive for weakness.  Endo/Heme/Allergies:  Positive for polydipsia.  Psychiatric/Behavioral:  The patient is nervous/anxious.    Past Medical History  He,  has a past medical history of Anemia, Asperger's syndrome, Asthma, Atrial fibrillation (HCC), Diverticulosis, GERD (gastroesophageal reflux disease), History of MRSA infection, Hypertension, Hyponatremia, Psoriasis, and Valvular heart disease.   Surgical History    Past Surgical History:  Procedure Laterality Date   COLONOSCOPY     COLONOSCOPY WITH PROPOFOL N/A 04/23/2018   Procedure: COLONOSCOPY WITH PROPOFOL;  Surgeon: Christena Deem, MD;  Location: [facility] ENDOSCOPY;  Service: Endoscopy;  Laterality: N/A;     Social History   reports that he has never smoked. He has  never used smokeless tobacco. He reports that he does not currently use alcohol. He reports that he does not use drugs.   Family History   His family history is not on file.   Allergies No Known Allergies   Home Medications  Prior to Admission medications   Medication Sig Start Date End Date Taking? Authorizing Provider  albuterol (VENTOLIN HFA) 108 (90 Base) MCG/ACT inhaler Inhale 2 puffs into the lungs every 6 (six) hours as needed for wheezing. 11/18/18  Yes [provider]  ascorbic acid (VITAMIN C) 1000 MG tablet Take 1,000 mg by mouth daily.   Yes [provider]  atenolol (TENORMIN) 25 MG tablet Take by mouth 2 (two) times daily.    Yes [provider]  B Complex Vitamins (B COMPLEX PO) Take 1 tablet by mouth daily.   Yes [provider]  fluticasone (FLOVENT HFA) 110 MCG/ACT inhaler Inhale 1 puff into the lungs in the morning and at bedtime. 12/06/20 12/06/21 Yes [provider]  folic acid (FOLVITE) 1 MG tablet Take 1 mg by mouth daily.   Yes [provider]  Garlic 100 MG TABS Take 100 mg by mouth daily.   Yes [provider]  hyoscyamine (LEVBID) 0.375 MG 12 hr tablet Take 1 tablet by mouth in  the morning and at bedtime. 07/26/20  Yes [provider]  lisinopril (PRINIVIL,ZESTRIL) 10 MG tablet Take 10 mg by mouth daily.   Yes [provider]  montelukast (SINGULAIR) 10 MG tablet Take 10 mg by mouth at bedtime.   Yes [provider]  Multiple Vitamin (MULTIVITAMIN) tablet Take 1 tablet by mouth daily.   Yes [provider]  Omega-3 Fatty Acids (SALMON OIL PO) Take 1 capsule by mouth daily.   Yes [provider]  pantoprazole (PROTONIX) 40 MG tablet Take 40 mg by mouth daily.   Yes [provider]  Turmeric (QC TUMERIC COMPLEX PO) Take 1 tablet by mouth daily.   Yes [provider]  Zinc Citrate-Phytase 25-500 MG CAPS Take 1 capsule by mouth daily.   Yes  [provider]  cyclobenzaprine (FLEXERIL) 5 MG tablet Take 1 tablet (5 mg total) by mouth 3 (three) times daily as needed for muscle spasms. Patient not taking: Reported on 10/08/2021 06/15/21   Burnadette Pop, MD    Scheduled Meds:  enoxaparin (LOVENOX) injection  40 mg Subcutaneous Q24H   Continuous Infusions:  sodium chloride (hypertonic) 25 mL/hr at 10/08/21 2158   PRN Meds:.diazepam, docusate sodium, polyethylene glycol   Active Hospital Problem list     Assessment & Plan:  Acute on Chronic Hyponatremia likely secondary to psychogenic polydipsia /Water intoxication Na+ 109 on admission, presenting with bilateral leg cramps and weakness Hx: Asperger's Syndrome -Check cortisol, TSH, serum and urine osmolality, sodium osmolality -Follow Serial Sodium Q2h -Keep NPO -Start Hypertonic Saline 3% with goal serum sodium level not > 8 to per L in the first 24 hour -Frequent Neurochecks as above -Seizure precautions -PRN Valium for muscle cramps -Nephrology consult to assist with management  Paroxysmal Atrial Fibrillation Rate appears controlled -Continue Atenolol -Currently not anticoagulated  Hypertension -Continue Lisinopril   Type 2 Diabetes mellitus -Check HgbA1c -CBGs -Sliding scale insulin -Continue Basal Insulin -Follow ICU hyper/hypoglycemia protocol   Asthma without status Asthmaticus -Supplemental O2 as needed to maintain O2 saturations > 92% -As needed bronchodilators   Best practice:  Diet:  Oral Pain/Anxiety/Delirium protocol (if indicated): No VAP protocol (if indicated): Not indicated DVT prophylaxis: LMWH GI prophylaxis: PPI Glucose control:  SSI Yes Central venous access:  N/A Arterial line:  N/A Foley:  Yes, and it is still needed Mobility:  bed rest  PT consulted: N/A Last date of multidisciplinary goals of care discussion [2/28] Code Status:  full code Disposition: ICU   = Goals of Care = Code Status Order: FULL   Primary Emergency Contact: Kautzman,ROBERT, Home Phone: [phone] Wishes to pursue full aggressive treatment and intervention options, including CPR and intubation, but goals of care will be addressed on going with family if that should become necessary.   Critical care time: 45 minutes     Webb Silversmith, DNP, CCRN, FNP-C, AGACNP-BC Acute Care Nurse Practitioner  [facility] Pager: [phone] [facility]  .

## 2021-10-08 NOTE — ED Notes (Signed)
Pt has asked several times for something for pain for his leg cramps. Pt does not show any non-verbal signs of pain and is watching television calmly. MD notified and aware. Advised pt that I would speak with MD as soon as I get the chance, verbalized understanding

## 2021-10-08 NOTE — ED Provider Notes (Signed)
[facility] Provider Note    Event Date/Time   First MD Initiated Contact with Patient 10/08/21 2031     (approximate)   History   Leg Cramps   HPI  Carl [name] is a 71 y.o. male here with leg cramps.  The patient is a fairly complex history including Asperger's and psychogenic polydipsia with chronic hyponatremia.  He states that over the last day, he has had to go to the bathroom more than usual.  He states that whenever he has a bowel movement, he feels like he needs to drink water to replanus his hydration.  He states that he drinks 2 to 3 glasses of water every time.  He has had 5-6 bowel movements today.  He states that he has subsequently developed diffuse body aches and cramps.  He is felt generally weak.  Denies any headaches.  No tremors.  No seizure-like activity.  History of similar presentation and recent admission for the same.  No medication changes.     Physical Exam   Triage Vital Signs: ED Triage Vitals  Enc Vitals Group     BP 10/08/21 1929 (!) 165/71     Pulse Rate 10/08/21 1929 71     Resp 10/08/21 1929 18     Temp 10/08/21 1929 97.7 F (36.5 C)     Temp Source 10/08/21 1929 Oral     SpO2 10/08/21 1929 100 %     Weight 10/08/21 1930 290 lb (131.5 kg)     Height 10/08/21 1930 6\' 4"  (1.93 m)     Head Circumference --      Peak Flow --      Pain Score 10/08/21 1929 4     Pain Loc --      Pain Edu? --      Excl. in [place]? --     Most recent vital signs: Vitals:   10/09/21 0130 10/09/21 0135  BP: (!) 154/78 (!) 154/78  Pulse: 69 64  Resp: 14 16  Temp:  98.2 F (36.8 C)  SpO2: 97% 97%     General: Awake, no distress.  CV:  Good peripheral perfusion.  No murmurs, rubs, or gallops. Resp:  Normal effort.  Normal work of breathing. Abd:  No distention.  Nontender. Other:  No lower extremity edema.  Cranial nerves intact.  Normal sensation throughout bilateral upper and lower extremities.  No focal neurological  deficits.   ED Results / Procedures / Treatments   Labs (all labs ordered are listed, but only abnormal results are displayed) Labs Reviewed  BASIC METABOLIC PANEL - Abnormal; Notable for the following components:      Result Value   Sodium 109 (*)    Chloride 75 (*)    Glucose, Bld 194 (*)    Creatinine, Ser 0.48 (*)    Calcium 8.4 (*)    All other components within normal limits  CBC - Abnormal; Notable for the following components:   MCV 79.1 (*)    All other components within normal limits  URINALYSIS, ROUTINE W REFLEX MICROSCOPIC - Abnormal; Notable for the following components:   Color, Urine COLORLESS (*)    APPearance CLEAR (*)    Specific Gravity, Urine 1.000 (*)    Glucose, UA >=500 (*)    Hgb urine dipstick SMALL (*)    All other components within normal limits  OSMOLALITY - Abnormal; Notable for the following components:   Osmolality 247 (*)    All other components  within normal limits  OSMOLALITY, URINE - Abnormal; Notable for the following components:   Osmolality, Ur 113 (*)    All other components within normal limits  SODIUM - Abnormal; Notable for the following components:   Sodium 114 (*)    All other components within normal limits  RESP PANEL BY RT-PCR (FLU A&B, COVID) ARPGX2  MAGNESIUM  SODIUM, URINE, RANDOM  SODIUM  SODIUM  SODIUM  SODIUM  CORTISOL  BASIC METABOLIC PANEL  TSH  T4, FREE  SODIUM  SODIUM  SODIUM  TROPONIN I (HIGH SENSITIVITY)  TROPONIN I (HIGH SENSITIVITY)     EKG Normal sinus rhythm, ventricular rate 72.  PR 178, QRS 86, QTc 396.  No acute ST elevations or depressions.   RADIOLOGY    PROCEDURES:  Critical Care performed: Yes, see critical care procedure note(s)  .Critical Care Performed by: Duffy Bruce, MD Authorized by: Duffy Bruce, MD   Critical care provider statement:    Critical care time (minutes):  30   Critical care time was exclusive of:  Separately billable procedures and treating other  patients   Critical care was necessary to treat or prevent imminent or life-threatening deterioration of the following conditions:  Circulatory failure, cardiac failure, respiratory failure and CNS failure or compromise   Critical care was time spent personally by me on the following activities:  Development of treatment plan with patient or surrogate, discussions with consultants, evaluation of patient's response to treatment, examination of patient, ordering and review of laboratory studies, ordering and review of radiographic studies, ordering and performing treatments and interventions, pulse oximetry, re-evaluation of patient's condition and review of old charts    MEDICATIONS ORDERED IN ED: Medications  sodium chloride (hypertonic) 3 % solution ( Intravenous New Bag/Given 10/08/21 2158)  diazepam (VALIUM) injection 2.5 mg (2.5 mg Intravenous Given 10/09/21 0135)  docusate sodium (COLACE) capsule 100 mg (has no administration in time range)  polyethylene glycol (MIRALAX / GLYCOLAX) packet 17 g (has no administration in time range)  enoxaparin (LOVENOX) injection 65 mg (has no administration in time range)  albuterol (PROVENTIL) (2.5 MG/3ML) 0.083% nebulizer solution 2.5 mg (has no administration in time range)  ascorbic acid (VITAMIN C) tablet 1,000 mg (has no administration in time range)  atenolol (TENORMIN) tablet 25 mg (has no administration in time range)  folic acid (FOLVITE) tablet 1 mg (has no administration in time range)  pantoprazole (PROTONIX) EC tablet 40 mg (has no administration in time range)  lisinopril (ZESTRIL) tablet 10 mg (has no administration in time range)  budesonide (PULMICORT) nebulizer solution 0.25 mg (has no administration in time range)  morphine (PF) 4 MG/ML injection 4 mg (4 mg Intravenous Given 10/08/21 2206)  ondansetron (ZOFRAN) injection 4 mg (4 mg Intravenous Given 10/08/21 2206)     IMPRESSION / MDM / [place] / ED COURSE  I reviewed the  triage vital signs and the nursing notes.                               The patient is on the cardiac monitor to evaluate for evidence of arrhythmia and/or significant heart rate changes.    MDM:  71 year old male with history of psychogenic polydipsia here with tremor/body aches. Suspect acute on chronic hyponatremia in setting of psychogenic polydipsia. Na 109 on BMP, Cl 75. CBC unremarkable. Renal function is normal. Urine markedly diluted. Serum osm pending. Pt has h/o similar episodes in the past.  He does not appear overtly hypervolemic. No seizures.   Discussed case with Dr. Juleen China. Will start on hypertonic saline, admit to ICU. Pt may need sitter to prevent free water intake.   MEDICATIONS GIVEN IN ED: Medications  sodium chloride (hypertonic) 3 % solution ( Intravenous New Bag/Given 10/08/21 2158)  diazepam (VALIUM) injection 2.5 mg (2.5 mg Intravenous Given 10/09/21 0135)  docusate sodium (COLACE) capsule 100 mg (has no administration in time range)  polyethylene glycol (MIRALAX / GLYCOLAX) packet 17 g (has no administration in time range)  enoxaparin (LOVENOX) injection 65 mg (has no administration in time range)  albuterol (PROVENTIL) (2.5 MG/3ML) 0.083% nebulizer solution 2.5 mg (has no administration in time range)  ascorbic acid (VITAMIN C) tablet 1,000 mg (has no administration in time range)  atenolol (TENORMIN) tablet 25 mg (has no administration in time range)  folic acid (FOLVITE) tablet 1 mg (has no administration in time range)  pantoprazole (PROTONIX) EC tablet 40 mg (has no administration in time range)  lisinopril (ZESTRIL) tablet 10 mg (has no administration in time range)  budesonide (PULMICORT) nebulizer solution 0.25 mg (has no administration in time range)  morphine (PF) 4 MG/ML injection 4 mg (4 mg Intravenous Given 10/08/21 2206)  ondansetron (ZOFRAN) injection 4 mg (4 mg Intravenous Given 10/08/21 2206)     Consults:  Nephrology Dr.  Juleen China Intensivist  EMR reviewed  Recent DC summary from 07/21/2021     FINAL CLINICAL IMPRESSION(S) / ED DIAGNOSES   Final diagnoses:  Hyponatremia  Psychogenic polydipsia     Rx / DC Orders   ED Discharge Orders     None        Note:  This document was prepared using Dragon voice recognition software and may include unintentional dictation errors.   Duffy Bruce, MD 10/09/21 [phone]

## 2021-10-08 NOTE — Consult Note (Signed)
PHARMACY - CRITICAL CARE PROGRESS NOTE  Pharmacy Consult for Hypertonic Saline Indication: Hyponatremia   No Known Allergies  Patient Measurements: Height: 6\' 4"  (193 cm) Weight: 131.5 kg (290 lb) IBW/kg (Calculated) : 86.8  Vital Signs: Temp: 97.7 F (36.5 C) (02/28 1929) Temp Source: Oral (02/28 1929) BP: 176/80 (02/28 2130) Pulse Rate: 75 (02/28 2130) Intake/Output from previous day: No intake/output data recorded. Intake/Output from this shift: No intake/output data recorded. Vent settings for last 24 hours:    Labs: Recent Labs    10/08/21 1933  WBC 5.0  HGB 13.8  HCT 40.1  PLT 210  CREATININE 0.48*   Estimated Creatinine Clearance: 129.1 mL/min (A) (by C-G formula based on SCr of 0.48 mg/dL (L)).  No results for input(s): GLUCAP in the last 72 hours.  Microbiology: No results found for this or any previous visit (from the past 720 hour(s)).  Medications:  (Not in a hospital admission)  Scheduled:  Infusions:   sodium chloride (hypertonic)     PRN:   Assessment: Pharmacy has been consulted to initiate and monitor hypertonic saline(3%) infusion in 71yo patient admitted with leg cramps. He stated that he has a history of hyponatremia in the past and that his leg pain feels similar to those instances. Patient's sodium level upon admission was 109.    Goal of Therapy:  Increase in Na by 4-6 mEq/L in 4-6 hours  Plan:  --Order was placed by EDP of NaCl 3%@25ml /hr started on 10/08/21@2158  --Will monitor sodium levels q2h x 2 occurrences, then q4h --Call RN to stop infusion and notify MD if: Na increases by 4 mEq/L or more in the first 2 hours or Na increases by 6 mEq/L or more in the first 4 hours     10/10/21 10/08/2021,9:44 PM

## 2021-10-08 NOTE — ED Triage Notes (Signed)
Pt to ED from home via EMS c/o bilateral leg cramps and weakness since this afternoon.  States hx of low sodium and thinks this is similar.  Denies other pain, states feels sleepy.  Pt A&Ox4, chest rise even and unlabored, speaking in complete and coherent sentences, in NAD at this time.

## 2021-10-08 NOTE — Progress Notes (Signed)
Anticoagulation monitoring(Lovenox):  71 yo male ordered Lovenox 40 mg Q24h    Filed Weights   10/08/21 1930  Weight: 131.5 kg (290 lb)   BMI 35   Lab Results  Component Value Date   CREATININE 0.48 (L) 10/08/2021   CREATININE 0.64 07/22/2021   CREATININE 0.71 07/21/2021   Estimated Creatinine Clearance: 129.1 mL/min (A) (by C-G formula based on SCr of 0.48 mg/dL (L)). Hemoglobin & Hematocrit     Component Value Date/Time   HGB 13.8 10/08/2021 1933   HCT 40.1 10/08/2021 1933     Per Protocol for Patient with estCrcl > 30 ml/min and BMI > 30, will transition to Lovenox 65 mg Q24h.

## 2021-10-09 DIAGNOSIS — E871 Hypo-osmolality and hyponatremia: Secondary | ICD-10-CM | POA: Diagnosis not present

## 2021-10-09 LAB — BASIC METABOLIC PANEL
Anion gap: 7 (ref 5–15)
Anion gap: 6 (ref 5–15)
BUN: 9 mg/dL (ref 8–23)
BUN: 9 mg/dL (ref 8–23)
CO2: 29 mmol/L (ref 22–32)
CO2: 31 mmol/L (ref 22–32)
Calcium: 8.9 mg/dL (ref 8.9–10.3)
Calcium: 8.7 mg/dL — ABNORMAL LOW (ref 8.9–10.3)
Chloride: 87 mmol/L — ABNORMAL LOW (ref 98–111)
Chloride: 87 mmol/L — ABNORMAL LOW (ref 98–111)
Creatinine, Ser: 0.58 mg/dL — ABNORMAL LOW (ref 0.61–1.24)
Creatinine, Ser: 0.68 mg/dL (ref 0.61–1.24)
GFR, Estimated: >60 mL/min (ref >60)
GFR, Estimated: >60 mL/min (ref >60)
Glucose, Bld: 155 mg/dL — ABNORMAL HIGH (ref 70–99)
Glucose, Bld: 150 mg/dL — ABNORMAL HIGH (ref 70–99)
Potassium: 4.5 mmol/L (ref 3.5–5.1)
Potassium: 4.9 mmol/L (ref 3.5–5.1)
Sodium: 123 mmol/L — ABNORMAL LOW (ref 135–145)
Sodium: 124 mmol/L — ABNORMAL LOW (ref 135–145)

## 2021-10-09 LAB — TSH: TSH: 3.634 u[IU]/mL (ref 0.350–4.500)

## 2021-10-09 LAB — SODIUM
Sodium: 122 mmol/L — ABNORMAL LOW (ref 135–145)
Sodium: 124 mmol/L — ABNORMAL LOW (ref 135–145)
Sodium: 122 mmol/L — ABNORMAL LOW (ref 135–145)
Sodium: 125 mmol/L — ABNORMAL LOW (ref 135–145)
Sodium: 128 mmol/L — ABNORMAL LOW (ref 135–145)

## 2021-10-09 LAB — RESP PANEL BY RT-PCR (FLU A&B, COVID) ARPGX2
Influenza A by PCR: NEGATIVE
Influenza B by PCR: NEGATIVE
SARS Coronavirus 2 by RT PCR: NEGATIVE

## 2021-10-09 LAB — T4, FREE: Free T4: 0.83 ng/dL (ref 0.61–1.12)

## 2021-10-09 LAB — CORTISOL: Cortisol, Plasma: 10.5 ug/dL

## 2021-10-09 LAB — GLUCOSE, CAPILLARY: Glucose-Capillary: 151 mg/dL — ABNORMAL HIGH (ref 70–99)

## 2021-10-09 LAB — MRSA NEXT GEN BY PCR, NASAL: MRSA by PCR Next Gen: NOT DETECTED

## 2021-10-09 MED ORDER — BUDESONIDE 0.25 MG/2ML IN SUSP
0.2500 mg | Freq: Two times a day (BID) | RESPIRATORY_TRACT | Status: DC
  Administered 2021-10-09 – 2021-10-15 (×9): 0.25 mg via RESPIRATORY_TRACT
  Filled 2021-10-09 (×8): qty 2

## 2021-10-09 MED ORDER — CHLORHEXIDINE GLUCONATE CLOTH 2 % EX PADS
6.0000 | MEDICATED_PAD | Freq: Every day | CUTANEOUS | Status: DC
  Administered 2021-10-10 – 2021-10-15 (×4): 6 via TOPICAL

## 2021-10-09 MED ORDER — DIAZEPAM 5 MG/ML IJ SOLN
5.0000 mg | Freq: Four times a day (QID) | INTRAMUSCULAR | Status: DC | PRN
  Administered 2021-10-09 (×3): 5 mg via INTRAVENOUS
  Filled 2021-10-09 (×3): qty 2

## 2021-10-09 NOTE — Plan of Care (Signed)
Pt admitted to ICU ~0230 for hyponatremia. 3% saline gtt @25ml /hr, per orders gtt weaned to 38ml/hr. Repeat Na 122; orders to turn off 3% gtt @0405 . Pt ANOx4, denies numbness/tingling, neuro assessment intact. Pt reporting muscle spasms in legs. PRN 5mg  IV valium given per orders; provided short term mild relief. Dark spots noted on bilateral plantar feet below big toe; callus vs pressure injury vs corn, assessed with 30m, RN. SBP 150s-160s. Scheduled atenolol given. SBP 100s. NPO except meds, no free water. Seizure precautions enforced. Call light within reach, calls appropriately. ? ?Problem: Education: ?Goal: Knowledge of General Education information will improve ?Description: Including pain rating scale, medication(s)/side effects and non-pharmacologic comfort measures ?Outcome: Progressing ?  ?Problem: Health Behavior/Discharge Planning: ?Goal: Ability to manage health-related needs will improve ?Outcome: Progressing ?  ?Problem: Clinical Measurements: ?Goal: Ability to maintain clinical measurements within normal limits will improve ?Outcome: Progressing ?Goal: Will remain free from infection ?Outcome: Progressing ?Goal: Diagnostic test results will improve ?Outcome: Progressing ?Goal: Respiratory complications will improve ?Outcome: Progressing ?Goal: Cardiovascular complication will be avoided ?Outcome: Progressing ?  ?Problem: Activity: ?Goal: Risk for activity intolerance will decrease ?Outcome: Progressing ?  ?Problem: Nutrition: ?Goal: Adequate nutrition will be maintained ?Outcome: Progressing ?  ?Problem: Coping: ?Goal: Level of anxiety will decrease ?Outcome: Progressing ?  ?Problem: Elimination: ?Goal: Will not experience complications related to bowel motility ?Outcome: Progressing ?Goal: Will not experience complications related to urinary retention ?Outcome: Progressing ?  ?Problem: Pain Managment: ?Goal: General experience of comfort will improve ?Outcome: Progressing ?  ?Problem:  Safety: ?Goal: Ability to remain free from injury will improve ?Outcome: Progressing ?  ?Problem: Skin Integrity: ?Goal: Risk for impaired skin integrity will decrease ?Outcome: Progressing ?  ?

## 2021-10-09 NOTE — Consult Note (Addendum)
PHARMACY - CRITICAL CARE PROGRESS NOTE ? ?Pharmacy Consult for Hypertonic Saline ?Indication: Hyponatremia ?  ?No Known Allergies ? ?Patient Measurements: ?Height: 6\' 4"  (193 cm) ?Weight: 125.2 kg (276 lb 0.3 oz) ?IBW/kg (Calculated) : 86.8 ? ?Vital Signs: ?Temp: 97.5 ?F (36.4 ?C) (03/01 0233) ?Temp Source: Oral (03/01 0233) ?BP: 152/73 (03/01 0300) ?Pulse Rate: 74 (03/01 0300) ?Intake/Output from previous day: ?02/28 0701 - 03/01 0700 ?In: 141.3 [I.V.:141.3] ?Out: 4000 [Urine:4000] ?Intake/Output from this shift: ?Total I/O ?In: 141.3 [I.V.:141.3] ?Out: 4000 [Urine:4000] ?Vent settings for last 24 hours: ?  ? ?Labs: ?Recent Labs  ?  10/08/21 ?1933 10/08/21 ?2104 10/09/21 ?0455  ?WBC 5.0  --   --   ?HGB 13.8  --   --   ?HCT 40.1  --   --   ?PLT 210  --   --   ?CREATININE 0.48*  --  0.58*  ?MG  --  1.9  --   ? ? ?Estimated Creatinine Clearance: 126 mL/min (A) (by C-G formula based on SCr of 0.58 mg/dL (L)). ? ?Recent Labs  ?  10/09/21 ?0220  ?GLUCAP 151*  ? ? ? ?Microbiology: ?Recent Results (from the past 720 hour(s))  ?Resp Panel by RT-PCR (Flu A&B, Covid) Nasopharyngeal Swab     Status: None  ? Collection Time: 10/08/21 12:30 AM  ? Specimen: Nasopharyngeal Swab; Nasopharyngeal(NP) swabs in vial transport medium  ?Result Value Ref Range Status  ? SARS Coronavirus 2 by RT PCR NEGATIVE NEGATIVE Final  ?  Comment: (NOTE) ?SARS-CoV-2 target nucleic acids are NOT DETECTED. ? ?The SARS-CoV-2 RNA is generally detectable in upper respiratory ?specimens during the acute phase of infection. The lowest ?concentration of SARS-CoV-2 viral copies this assay can detect is ?138 copies/mL. A negative result does not preclude SARS-Cov-2 ?infection and should not be used as the sole basis for treatment or ?other patient management decisions. A negative result may occur with  ?improper specimen collection/handling, submission of specimen other ?than nasopharyngeal swab, presence of viral mutation(s) within the ?areas targeted by this  assay, and inadequate number of viral ?copies(<138 copies/mL). A negative result must be combined with ?clinical observations, patient history, and epidemiological ?information. The expected result is Negative. ? ?Fact Sheet for Patients:  ?10/10/21 ? ?Fact Sheet for Healthcare Providers:  ?BloggerCourse.com ? ?This test is no t yet approved or cleared by the [employer] FDA and  ?has been authorized for detection and/or diagnosis of SARS-CoV-2 by ?FDA under an Emergency Use Authorization (EUA). This EUA will remain  ?in effect (meaning this test can be used) for the duration of the ?COVID-19 declaration under Section 564(b)(1) of the Act, 21 ?U.S.C.section 360bbb-3(b)(1), unless the authorization is terminated  ?or revoked sooner.  ? ? ?  ? Influenza A by PCR NEGATIVE NEGATIVE Final  ? Influenza B by PCR NEGATIVE NEGATIVE Final  ?  Comment: (NOTE) ?The Xpert Xpress SARS-CoV-2/FLU/RSV plus assay is intended as an aid ?in the diagnosis of influenza from Nasopharyngeal swab specimens and ?should not be used as a sole basis for treatment. Nasal washings and ?aspirates are unacceptable for Xpert Xpress SARS-CoV-2/FLU/RSV ?testing. ? ?Fact Sheet for Patients: ?[place] ? ?Fact Sheet for Healthcare Providers: ?BloggerCourse.com ? ?This test is not yet approved or cleared by the [employer] FDA and ?has been authorized for detection and/or diagnosis of SARS-CoV-2 by ?FDA under an Emergency Use Authorization (EUA). This EUA will remain ?in effect (meaning this test can be used) for the duration of the ?COVID-19 declaration under Section 564(b)(1)  of the Act, 21 U.S.C. ?section 360bbb-3(b)(1), unless the authorization is terminated or ?revoked. ? ?Performed at [facility], [address] [place], ?[place] [address] ?  ?MRSA Next Gen by PCR, Nasal     Status: None  ? Collection Time: 10/09/21  3:38  AM  ? Specimen: Nasal Mucosa; Nasal Swab  ?Result Value Ref Range Status  ? MRSA by PCR Next Gen NOT DETECTED NOT DETECTED Final  ?  Comment: (NOTE) ?The GeneXpert MRSA Assay (FDA approved for NASAL specimens only), ?is one component of a comprehensive MRSA colonization surveillance ?program. It is not intended to diagnose MRSA infection nor to guide ?or monitor treatment for MRSA infections. ?Test performance is not FDA approved in patients less than 2 years ?old. ?Performed at [facility], [address] [place], ?[place] [address] ?  ? ? ?Medications:  ?Medications Prior to Admission  ?Medication Sig Dispense Refill Last Dose  ? albuterol (VENTOLIN HFA) 108 (90 Base) MCG/ACT inhaler Inhale 2 puffs into the lungs every 6 (six) hours as needed for wheezing.   prn at prn  ? ascorbic acid (VITAMIN C) 1000 MG tablet Take 1,000 mg by mouth daily.   Past Week  ? atenolol (TENORMIN) 25 MG tablet Take by mouth 2 (two) times daily.    Past Week  ? B Complex Vitamins (B COMPLEX PO) Take 1 tablet by mouth daily.   Past Week  ? fluticasone (FLOVENT HFA) 110 MCG/ACT inhaler Inhale 1 puff into the lungs in the morning and at bedtime.   Past Week  ? folic acid (FOLVITE) 1 MG tablet Take 1 mg by mouth daily.   Past Week  ? Garlic 100 MG TABS Take 100 mg by mouth daily.   Past Week  ? hyoscyamine (LEVBID) 0.375 MG 12 hr tablet Take 1 tablet by mouth in the morning and at bedtime.   Past Week  ? lisinopril (PRINIVIL,ZESTRIL) 10 MG tablet Take 10 mg by mouth daily.   Past Week  ? montelukast (SINGULAIR) 10 MG tablet Take 10 mg by mouth at bedtime.   Past Week  ? Multiple Vitamin (MULTIVITAMIN) tablet Take 1 tablet by mouth daily.   Past Week  ? Omega-3 Fatty Acids (SALMON OIL PO) Take 1 capsule by mouth daily.   Past Week  ? pantoprazole (PROTONIX) 40 MG tablet Take 40 mg by mouth daily.   Past Week  ? Turmeric (QC TUMERIC COMPLEX PO) Take 1 tablet by mouth daily.   Past Week  ? Zinc Citrate-Phytase 25-500 MG CAPS Take 1  capsule by mouth daily.   Past Week  ? cyclobenzaprine (FLEXERIL) 5 MG tablet Take 1 tablet (5 mg total) by mouth 3 (three) times daily as needed for muscle spasms. (Patient not taking: Reported on 10/08/2021) 30 tablet 0 Not Taking  ? ?Scheduled:  ? ascorbic acid  1,000 mg Oral Daily  ? atenolol  25 mg Oral BID  ? budesonide (PULMICORT) nebulizer solution  0.25 mg Nebulization BID  ? enoxaparin (LOVENOX) injection  0.5 mg/kg Subcutaneous Q24H  ? folic acid  1 mg Oral Daily  ? lisinopril  10 mg Oral Daily  ? pantoprazole  40 mg Oral Daily  ? ?Infusions:  ? ? ?PRN:  ? ?Assessment: ?Pharmacy has been consulted to initiate and monitor hypertonic saline(3%) infusion in 70yo patient admitted with leg cramps. He stated that he has a history of hyponatremia in the past and that his leg pain feels similar to those instances. Patient's sodium  level upon admission was 109.   ? ?Goal of Therapy:  ?Increase in Na by 4-6 mEq/L in 4-6 hours ? ?Plan:  ?--Order was placed by EDP of NaCl 3%@25ml /hr started on 10/08/21@2158  ?--Will monitor sodium levels q2h x 2 occurrences, then q4h ?--Call RN to stop infusion and notify MD if: ?Na increases by 4 mEq/L or more in the first 2 hours or ?Na increases by 6 mEq/L or more in the first 4 hours ? ?02/23:  Na @ 2104 = 114 ?03/01:  Na @ 0243 = 122  ?03/01:  Na @ 0455 = 123 ? ?-  Most recent Na reflects a rise of 8 mEq in serum Na.  Will d/c 3% NaCl infusion for now and redraw Na level STAT to r/o false lab reading.  ? ?- 0455 Na = 123, so previous level is valid.  Will continue to follow Na Q4H.  NP and Dr Jamal Collin aware of most recent Na but does not want to start Desmopressin ;  pt should autocorrect.  ? ? ?Gary Bultman D ?10/09/2021,5:31 AM ? ? ?

## 2021-10-09 NOTE — Progress Notes (Signed)
NAME:  Carl [name], MRN:  [id number], DOB:  November 15, 1950, LOS: 1 ADMISSION DATE:  10/08/2021, CONSULTATION DATE:  10/08/2021 REFERRING MD:  Dr. Erma Heritage, CHIEF COMPLAINT:  Weakness and bilateral leg cramps   Brief Pt Description / Synopsis:  71 y.o. Male admitted with Acute on Chronic Hyponatremia (Na+ 109 on admission) likely secondary to psychogenic polydipsia /Water intoxication.  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   Patient report drinking large amount of water this afternoon due to sense of incomplete bowel movement. On review of his chart, he was admitted on 04/2021, 06/2021 and 12/11 for hyponatremia secondary to psychogenic polydipsia. He was placed on fluid restriction with improvement of his sodium levels at discharge. He continues to drink excessive amount of water despite multiple admissions and recommendation to restrict free water intake.   ED Course: In the emergency department, the temperature was 36.5 C, the heart rate 71 beats/minute, the blood pressure  165/71 mm Hg, the respiratory rate 18 breaths/minute, and the oxygen saturation 100 % on RA. Pertinent Labs in Red/Diagnostics Findings: Na+/ K+: 109/4.5 Glucose: 194 BUN/Cr.:11/0.48 Calcium: 8.4 Serum Osmolality: 247 Urine Osmolality:113   COVID PCR: Negative Urinalysis: Spec gravity 1.0, Glucose>500   Due to severe hyponatremia as above, Nephrology was consulted who recommended to start hypertonic solution 3% at 57ml/hr with frequent sodium checks. PCCM consulted to admit to the ICU for closer monitoring.  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 Place on 3% saline.  Nephrology consulted 03/01: 3% saline d/c due to overcorrection.  Fluid restriction in place, low threshold to start D5w.  Interim History / Subjective:  -No significant events noted overnight -Afebrile, hemodynamically stable, no room air -3% Saline was d/c earlier this morning due to correction rate of Na to 123 (109 at admission), correction of 14 mEq over 9 hrs -Repeat Na+ pending this morning ~ Na 124 -Pt is awake and alert, oriented x4, no focal deficits -Discussed with Nephrology, will order diet with fluid restriction on 1500 cc ~ low threshold to start D5W  Objective   Blood pressure (!) 103/58, pulse 63, temperature (!) 97.5 F (36.4 C), temperature source Oral, resp. rate 18, height 6\' 4"  (1.93 m), weight 125.2 kg, SpO2 97 %.        Intake/Output Summary (Last 24 hours) at 10/09/2021 0737 Last data filed at 10/09/2021 0600 Gross per 24 hour  Intake 145.34 ml  Output 4000 ml  Net -3854.66 ml   Filed Weights   10/08/21 1930 10/09/21 0233  Weight: 131.5 kg 125.2 kg    Examination: General: Acute on chronically ill-appearing male, sitting in bed, on room air, no acute distress HENT: Atraumatic, normocephalic, neck supple, no JVD Lungs: Clear to auscultation bilaterally, no rales or wheezing noted, even, nonlabored Cardiovascular: Regular rate and rhythm, S1-S2, no murmurs, rubs, gallops Abdomen: Soft, nontender, nondistended, no guarding rebound tenderness, bowel sound  positive x4 Extremities: Bilateral pitting edema, no cyanosis or clubbing noted Neuro: Sleeping, arouses  easily to voice, alert and oriented x4, follows commands, no focal deficits GU: Deferred, using urinal  Resolved Hospital Problem list     Assessment & Plan:   Acute on Chronic Hyponatremia likely secondary to psychogenic polydipsia /Water intoxication Na+ 109 on admission, presenting with bilateral leg cramps and weakness Hx: Asperger's Syndrome -Monitor I&O's / urinary output -Follow BMP (serum Na q4h) -Ensure adequate renal perfusion -Avoid nephrotoxic agents as able -Replace electrolytes as indicated -Water restriction -Goal rate of Na correction 4-6 mEq per 24 hrs (MAX rate of correction 8-10 mEq per 24 hrs) -3% saline d/c 3/1 -Cortisol and TSH normal -Frequent Neurochecks as above -Seizure precautions -PRN Valium for muscle cramps -Nephrology following, appreciate input ~discussed with Dr. Wynelle Link, will start diet with 1500 cc fluid restriction ~low threshold to start D5W infusion   Paroxysmal Atrial Fibrillation Hypertension Rate appears controlled -Continuous cardiac monitoring -Continue Atenolol and Lisinopril -Currently not anticoagulated   Type 2 Diabetes mellitus -Check HgbA1c -CBGs -Sliding scale insulin -Continue Basal Insulin -Follow ICU hyper/hypoglycemia protocol   Asthma without status Asthmaticus -Supplemental O2 as needed to maintain O2 saturations > 92% -As needed bronchodilators   Best Practice (right click and "Reselect all SmartList Selections" daily)   Diet/type: Regular, 1500 cc fluid restriction DVT prophylaxis: LMWH GI prophylaxis: PPI Lines: N/A Foley:  N/A Code Status:  full code Last date of multidisciplinary goals of care discussion [10/09/21]  Labs   CBC: Recent Labs  Lab 10/08/21 1933  WBC 5.0  HGB 13.8  HCT 40.1  MCV 79.1*  PLT 210    Basic Metabolic Panel: Recent Labs  Lab 10/08/21 1933 10/08/21 2104 10/09/21 0243 10/09/21 0455  NA 109* 114* 122* 123*  K 4.5  --   --  4.5  CL 75*  --   --  87*  CO2 25  --    --  29  GLUCOSE 194*  --   --  155*  BUN 11  --   --  9  CREATININE 0.48*  --   --  0.58*  CALCIUM 8.4*  --   --  8.9  MG  --  1.9  --   --    GFR: Estimated Creatinine Clearance: 126 mL/min (A) (by C-G formula based on SCr of 0.58 mg/dL (L)). Recent Labs  Lab 10/08/21 1933  WBC 5.0    Liver Function Tests: No results for input(s): AST, ALT, ALKPHOS, BILITOT, PROT, ALBUMIN in the last 168 hours. No results for input(s): LIPASE, AMYLASE in the last 168 hours. No results for input(s): AMMONIA in the last 168 hours.  ABG No results found for: PHART, PCO2ART, PO2ART, HCO3, TCO2, ACIDBASEDEF, O2SAT   Coagulation Profile: No results for input(s): INR, PROTIME in the last 168 hours.  Cardiac Enzymes: No results for input(s): CKTOTAL, CKMB, CKMBINDEX, TROPONINI in the last 168 hours.  HbA1C: No results found for: HGBA1C  CBG: Recent Labs  Lab 10/09/21 0220  GLUCAP 151*    Review of Systems:   Positives in BOLD: Patient currently denies all complaints Gen: Denies fever, chills, weight change, fatigue, night sweats HEENT: Denies blurred vision, double vision, hearing loss, tinnitus, sinus congestion, rhinorrhea, sore throat, neck stiffness, dysphagia PULM: Denies shortness of breath, cough, sputum production, hemoptysis, wheezing CV: Denies chest pain, edema, orthopnea, paroxysmal nocturnal dyspnea, palpitations GI: Denies abdominal pain, nausea, vomiting, diarrhea, hematochezia, melena, constipation, change in bowel habits GU: Denies  dysuria, hematuria, polyuria, oliguria, urethral discharge Endocrine: Denies hot or cold intolerance, polyuria, polyphagia or appetite change Derm: Denies rash, dry skin, scaling or peeling skin change Heme: Denies easy bruising, bleeding, bleeding gums Neuro: Denies headache, numbness, weakness, slurred speech, loss of memory or consciousness   Past Medical History:  He,  has a past medical history of Anemia, Asperger's syndrome, Asthma,  Atrial fibrillation (HCC), Diverticulosis, GERD (gastroesophageal reflux disease), History of MRSA infection, Hypertension, Hyponatremia, Psoriasis, and Valvular heart disease.   Surgical History:   Past Surgical History:  Procedure Laterality Date   COLONOSCOPY     COLONOSCOPY WITH PROPOFOL N/A 04/23/2018   Procedure: COLONOSCOPY WITH PROPOFOL;  Surgeon: Christena Deem, MD;  Location: [facility] ENDOSCOPY;  Service: Endoscopy;  Laterality: N/A;     Social History:   reports that he has never smoked. He has never used smokeless tobacco. He reports that he does not currently use alcohol. He reports that he does not use drugs.   Family History:  His family history is not on file.   Allergies No Known Allergies   Home Medications  Prior to Admission medications   Medication Sig Start Date End Date Taking? Authorizing Provider  albuterol (VENTOLIN HFA) 108 (90 Base) MCG/ACT inhaler Inhale 2 puffs into the lungs every 6 (six) hours as needed for wheezing. 11/18/18  Yes [provider]  ascorbic acid (VITAMIN C) 1000 MG tablet Take 1,000 mg by mouth daily.   Yes [provider]  atenolol (TENORMIN) 25 MG tablet Take by mouth 2 (two) times daily.    Yes [provider]  B Complex Vitamins (B COMPLEX PO) Take 1 tablet by mouth daily.   Yes [provider]  fluticasone (FLOVENT HFA) 110 MCG/ACT inhaler Inhale 1 puff into the lungs in the morning and at bedtime. 12/06/20 12/06/21 Yes [provider]  folic acid (FOLVITE) 1 MG tablet Take 1 mg by mouth daily.   Yes [provider]  Garlic 100 MG TABS Take 100 mg by mouth daily.   Yes [provider]  hyoscyamine (LEVBID) 0.375 MG 12 hr tablet Take 1 tablet by mouth in the morning and at bedtime. 07/26/20  Yes [provider]  lisinopril (PRINIVIL,ZESTRIL) 10 MG tablet Take 10 mg by mouth daily.   Yes [provider]  montelukast (SINGULAIR) 10 MG tablet Take 10 mg by mouth  at bedtime.   Yes [provider]  Multiple Vitamin (MULTIVITAMIN) tablet Take 1 tablet by mouth daily.   Yes [provider]  Omega-3 Fatty Acids (SALMON OIL PO) Take 1 capsule by mouth daily.   Yes [provider]  pantoprazole (PROTONIX) 40 MG tablet Take 40 mg by mouth daily.   Yes [provider]  Turmeric (QC TUMERIC COMPLEX PO) Take 1 tablet by mouth daily.   Yes [provider]  Zinc Citrate-Phytase 25-500 MG CAPS Take 1 capsule by mouth daily.   Yes [provider]  cyclobenzaprine (FLEXERIL) 5 MG tablet Take 1 tablet (5 mg total) by mouth 3 (three) times daily as needed for muscle spasms. Patient not taking: Reported on 10/08/2021 06/15/21   Burnadette Pop, MD     Critical care time: 40 minutes     Harlon Ditty, AGACNP-BC [facility] Prefer epic messenger for cross cover needs If after hours, please call E-link

## 2021-10-09 NOTE — ED Notes (Signed)
Report called to Rachelle Hora  icu nurse ?

## 2021-10-09 NOTE — Consult Note (Signed)
PHARMACY - CRITICAL CARE PROGRESS NOTE ? ?Pharmacy Consult for Hypertonic Saline ?Indication: Hyponatremia ?  ?No Known Allergies ? ?Patient Measurements: ?Height: 6\' 4"  (193 cm) ?Weight: 125.2 kg (276 lb 0.3 oz) ?IBW/kg (Calculated) : 86.8 ? ?Vital Signs: ?Temp: 97.5 ?F (36.4 ?C) (03/01 0233) ?Temp Source: Oral (03/01 0233) ?BP: 152/73 (03/01 0300) ?Pulse Rate: 74 (03/01 0300) ?Intake/Output from previous day: ?02/28 0701 - 03/01 0700 ?In: -  ?Out: 4000 [Urine:4000] ?Intake/Output from this shift: ?Total I/O ?In: -  ?Out: 4000 [Urine:4000] ?Vent settings for last 24 hours: ?  ? ?Labs: ?Recent Labs  ?  10/08/21 ?1933 10/08/21 ?2104  ?WBC 5.0  --   ?HGB 13.8  --   ?HCT 40.1  --   ?PLT 210  --   ?CREATININE 0.48*  --   ?MG  --  1.9  ? ? ?Estimated Creatinine Clearance: 126 mL/min (A) (by C-G formula based on SCr of 0.48 mg/dL (L)). ? ?Recent Labs  ?  10/09/21 ?0220  ?GLUCAP 151*  ? ? ?Microbiology: ?Recent Results (from the past 720 hour(s))  ?Resp Panel by RT-PCR (Flu A&B, Covid) Nasopharyngeal Swab     Status: None  ? Collection Time: 10/08/21 12:30 AM  ? Specimen: Nasopharyngeal Swab; Nasopharyngeal(NP) swabs in vial transport medium  ?Result Value Ref Range Status  ? SARS Coronavirus 2 by RT PCR NEGATIVE NEGATIVE Final  ?  Comment: (NOTE) ?SARS-CoV-2 target nucleic acids are NOT DETECTED. ? ?The SARS-CoV-2 RNA is generally detectable in upper respiratory ?specimens during the acute phase of infection. The lowest ?concentration of SARS-CoV-2 viral copies this assay can detect is ?138 copies/mL. A negative result does not preclude SARS-Cov-2 ?infection and should not be used as the sole basis for treatment or ?other patient management decisions. A negative result may occur with  ?improper specimen collection/handling, submission of specimen other ?than nasopharyngeal swab, presence of viral mutation(s) within the ?areas targeted by this assay, and inadequate number of viral ?copies(<138 copies/mL). A negative result  must be combined with ?clinical observations, patient history, and epidemiological ?information. The expected result is Negative. ? ?Fact Sheet for Patients:  ?BloggerCourse.com ? ?Fact Sheet for Healthcare Providers:  ?SeriousBroker.it ? ?This test is no t yet approved or cleared by the [place] FDA and  ?has been authorized for detection and/or diagnosis of SARS-CoV-2 by ?FDA under an Emergency Use Authorization (EUA). This EUA will remain  ?in effect (meaning this test can be used) for the duration of the ?COVID-19 declaration under Section 564(b)(1) of the Act, 21 ?U.S.C.section 360bbb-3(b)(1), unless the authorization is terminated  ?or revoked sooner.  ? ? ?  ? Influenza A by PCR NEGATIVE NEGATIVE Final  ? Influenza B by PCR NEGATIVE NEGATIVE Final  ?  Comment: (NOTE) ?The Xpert Xpress SARS-CoV-2/FLU/RSV plus assay is intended as an aid ?in the diagnosis of influenza from Nasopharyngeal swab specimens and ?should not be used as a sole basis for treatment. Nasal washings and ?aspirates are unacceptable for Xpert Xpress SARS-CoV-2/FLU/RSV ?testing. ? ?Fact Sheet for Patients: ?BloggerCourse.com ? ?Fact Sheet for Healthcare Providers: ?SeriousBroker.it ? ?This test is not yet approved or cleared by the [place] FDA and ?has been authorized for detection and/or diagnosis of SARS-CoV-2 by ?FDA under an Emergency Use Authorization (EUA). This EUA will remain ?in effect (meaning this test can be used) for the duration of the ?COVID-19 declaration under Section 564(b)(1) of the Act, 21 U.S.C. ?section 360bbb-3(b)(1), unless the authorization is terminated or ?revoked. ? ?Performed at [facility]  [facility], [address], ?[place] [address] ?  ? ? ?Medications:  ?Medications Prior to Admission  ?Medication Sig Dispense Refill Last Dose  ? albuterol (VENTOLIN HFA) 108 (90 Base) MCG/ACT inhaler Inhale 2  puffs into the lungs every 6 (six) hours as needed for wheezing.   prn at prn  ? ascorbic acid (VITAMIN C) 1000 MG tablet Take 1,000 mg by mouth daily.   Past Week  ? atenolol (TENORMIN) 25 MG tablet Take by mouth 2 (two) times daily.    Past Week  ? B Complex Vitamins (B COMPLEX PO) Take 1 tablet by mouth daily.   Past Week  ? fluticasone (FLOVENT HFA) 110 MCG/ACT inhaler Inhale 1 puff into the lungs in the morning and at bedtime.   Past Week  ? folic acid (FOLVITE) 1 MG tablet Take 1 mg by mouth daily.   Past Week  ? Garlic 100 MG TABS Take 100 mg by mouth daily.   Past Week  ? hyoscyamine (LEVBID) 0.375 MG 12 hr tablet Take 1 tablet by mouth in the morning and at bedtime.   Past Week  ? lisinopril (PRINIVIL,ZESTRIL) 10 MG tablet Take 10 mg by mouth daily.   Past Week  ? montelukast (SINGULAIR) 10 MG tablet Take 10 mg by mouth at bedtime.   Past Week  ? Multiple Vitamin (MULTIVITAMIN) tablet Take 1 tablet by mouth daily.   Past Week  ? Omega-3 Fatty Acids (SALMON OIL PO) Take 1 capsule by mouth daily.   Past Week  ? pantoprazole (PROTONIX) 40 MG tablet Take 40 mg by mouth daily.   Past Week  ? Turmeric (QC TUMERIC COMPLEX PO) Take 1 tablet by mouth daily.   Past Week  ? Zinc Citrate-Phytase 25-500 MG CAPS Take 1 capsule by mouth daily.   Past Week  ? cyclobenzaprine (FLEXERIL) 5 MG tablet Take 1 tablet (5 mg total) by mouth 3 (three) times daily as needed for muscle spasms. (Patient not taking: Reported on 10/08/2021) 30 tablet 0 Not Taking  ? ?Scheduled:  ? ascorbic acid  1,000 mg Oral Daily  ? atenolol  25 mg Oral BID  ? budesonide (PULMICORT) nebulizer solution  0.25 mg Nebulization BID  ? enoxaparin (LOVENOX) injection  0.5 mg/kg Subcutaneous Q24H  ? folic acid  1 mg Oral Daily  ? lisinopril  10 mg Oral Daily  ? pantoprazole  40 mg Oral Daily  ? ?Infusions:  ? ? ?PRN:  ? ?Assessment: ?Pharmacy has been consulted to initiate and monitor hypertonic saline(3%) infusion in 70yo patient admitted with leg cramps. He  stated that he has a history of hyponatremia in the past and that his leg pain feels similar to those instances. Patient's sodium level upon admission was 109.   ? ?Goal of Therapy:  ?Increase in Na by 4-6 mEq/L in 4-6 hours ? ?Plan:  ?--Order was placed by EDP of NaCl 3%@25ml /hr started on 10/08/21@2158  ?--Will monitor sodium levels q2h x 2 occurrences, then q4h ?--Call RN to stop infusion and notify MD if: ?Na increases by 4 mEq/L or more in the first 2 hours or ?Na increases by 6 mEq/L or more in the first 4 hours ? ?02/23:  Na @ 2104 = 114 ?03/01:  Na @ 0243 = 122  ? ?-  Most recent Na reflects a rise of 8 mEq in serum Na.  Will d/c 3% NaCl infusion for now and redraw Na level STAT to r/o false lab reading.  ? ? ? ? ?  Samie Reasons D ?10/09/2021,4:13 AM ? ? ?

## 2021-10-09 NOTE — ED Notes (Signed)
Meds given for spasms.  Pt alert, telling jokes .  Iv fluids infusing ?

## 2021-10-09 NOTE — Progress Notes (Signed)
?[facility]  ?ROUNDING NOTE  ? ?Subjective:  ? ?[name] was admitted to [facility] on 10/08/2021 for Hyponatremia [E87.1] ? ?Patient was drinkin a great deal of water to compensate for having constipation.  ? ?Patient states that he was following a fluid restriction prior to this admission.  ? ?Patient was found to have a serum sodium of 109 and started on hypertonic saline. Patient now with serum sodium of 123. So hypertonic saline infusion stopped.  ? ?Patient is lethargic but able to answer questions and follow commands appropriately. Alert and oriented times 4.  ? ?Denies use of diuretics.  ?Claims to be taking all his medications as prescribed.  ? ?Objective:  ?Vital signs in last 24 hours:  ?Temp:  [97.5 ?F (36.4 ?C)-98.5 ?F (36.9 ?C)] 98.5 ?F (36.9 ?C) (03/01 0734) ?Pulse Rate:  [57-77] 63 (03/01 0734) ?Resp:  [9-22] 18 (03/01 0734) ?BP: (103-178)/(58-89) 127/72 (03/01 0700) ?SpO2:  [94 %-100 %] 97 % (03/01 0734) ?Weight:  [125.2 kg-131.5 kg] 125.2 kg (03/01 0233) ? ?Weight change:  ?Filed Weights  ? 10/08/21 1930 10/09/21 0233  ?Weight: 131.5 kg 125.2 kg  ? ? ?Intake/Output: ?I/O last 3 completed shifts: ?In: 145.3 [I.V.:145.3] ?Out: 4000 [Urine:4000] ?  ?Intake/Output this shift: ? Total I/O ?In: -  ?Out: 1050 [Urine:1050] ? ?Physical Exam: ?General: NAD,   ?Head: Normocephalic, atraumatic. Moist oral mucosal membranes  ?Eyes: Anicteric, PERRL  ?Neck: Supple, trachea midline  ?Lungs:  Clear to auscultation  ?Heart: Regular rate and rhythm  ?Abdomen:  Soft, nontender,   ?Extremities:  no peripheral edema.  ?Neurologic: Nonfocal, moving all four extremities, lethargic  ?Skin: No lesions  ?    ? ? ?Basic Metabolic Panel: ?Recent Labs  ?Lab 10/08/21 ?1933 10/08/21 ?2104 10/09/21 ?8756 10/09/21 ?0455  ?NA 109* 114* 122* 123*  ?K 4.5  --   --  4.5  ?CL 75*  --   --  87*  ?CO2 25  --   --  29  ?GLUCOSE 194*  --   --  155*  ?BUN 11  --   --  9  ?CREATININE 0.48*  --   --  0.58*  ?CALCIUM  8.4*  --   --  8.9  ?MG  --  1.9  --   --   ? ? ?Liver Function Tests: ?No results for input(s): AST, ALT, ALKPHOS, BILITOT, PROT, ALBUMIN in the last 168 hours. ?No results for input(s): LIPASE, AMYLASE in the last 168 hours. ?No results for input(s): AMMONIA in the last 168 hours. ? ?CBC: ?Recent Labs  ?Lab 10/08/21 ?1933  ?WBC 5.0  ?HGB 13.8  ?HCT 40.1  ?MCV 79.1*  ?PLT 210  ? ? ?Cardiac Enzymes: ?No results for input(s): CKTOTAL, CKMB, CKMBINDEX, TROPONINI in the last 168 hours. ? ?BNP: ?Invalid input(s): POCBNP ? ?CBG: ?Recent Labs  ?Lab 10/09/21 ?0220  ?GLUCAP 151*  ? ? ?Microbiology: ?Results for orders placed or performed during the hospital encounter of 10/08/21  ?Resp Panel by RT-PCR (Flu A&B, Covid) Nasopharyngeal Swab     Status: None  ? Collection Time: 10/08/21 12:30 AM  ? Specimen: Nasopharyngeal Swab; Nasopharyngeal(NP) swabs in vial transport medium  ?Result Value Ref Range Status  ? SARS Coronavirus 2 by RT PCR NEGATIVE NEGATIVE Final  ?  Comment: (NOTE) ?SARS-CoV-2 target nucleic acids are NOT DETECTED. ? ?The SARS-CoV-2 RNA is generally detectable in upper respiratory ?specimens during the acute phase of infection. The lowest ?concentration of SARS-CoV-2 viral copies this assay  can detect is ?138 copies/mL. A negative result does not preclude SARS-Cov-2 ?infection and should not be used as the sole basis for treatment or ?other patient management decisions. A negative result may occur with  ?improper specimen collection/handling, submission of specimen other ?than nasopharyngeal swab, presence of viral mutation(s) within the ?areas targeted by this assay, and inadequate number of viral ?copies(<138 copies/mL). A negative result must be combined with ?clinical observations, patient history, and epidemiological ?information. The expected result is Negative. ? ?Fact Sheet for Patients:  ?BloggerCourse.com ? ?Fact Sheet for Healthcare Providers:   ?SeriousBroker.it ? ?This test is no t yet approved or cleared by the [place] FDA and  ?has been authorized for detection and/or diagnosis of SARS-CoV-2 by ?FDA under an Emergency Use Authorization (EUA). This EUA will remain  ?in effect (meaning this test can be used) for the duration of the ?COVID-19 declaration under Section 564(b)(1) of the Act, 21 ?U.S.C.section 360bbb-3(b)(1), unless the authorization is terminated  ?or revoked sooner.  ? ? ?  ? Influenza A by PCR NEGATIVE NEGATIVE Final  ? Influenza B by PCR NEGATIVE NEGATIVE Final  ?  Comment: (NOTE) ?The Xpert Xpress SARS-CoV-2/FLU/RSV plus assay is intended as an aid ?in the diagnosis of influenza from Nasopharyngeal swab specimens and ?should not be used as a sole basis for treatment. Nasal washings and ?aspirates are unacceptable for Xpert Xpress SARS-CoV-2/FLU/RSV ?testing. ? ?Fact Sheet for Patients: ?BloggerCourse.com ? ?Fact Sheet for Healthcare Providers: ?SeriousBroker.it ? ?This test is not yet approved or cleared by the [place] FDA and ?has been authorized for detection and/or diagnosis of SARS-CoV-2 by ?FDA under an Emergency Use Authorization (EUA). This EUA will remain ?in effect (meaning this test can be used) for the duration of the ?COVID-19 declaration under Section 564(b)(1) of the Act, 21 U.S.C. ?section 360bbb-3(b)(1), unless the authorization is terminated or ?revoked. ? ?Performed at [facility], [address] [place], ?[place] [address] ?  ?MRSA Next Gen by PCR, Nasal     Status: None  ? Collection Time: 10/09/21  3:38 AM  ? Specimen: Nasal Mucosa; Nasal Swab  ?Result Value Ref Range Status  ? MRSA by PCR Next Gen NOT DETECTED NOT DETECTED Final  ?  Comment: (NOTE) ?The GeneXpert MRSA Assay (FDA approved for NASAL specimens only), ?is one component of a comprehensive MRSA colonization surveillance ?program. It is not intended to  diagnose MRSA infection nor to guide ?or monitor treatment for MRSA infections. ?Test performance is not FDA approved in patients less than 2 years ?old. ?Performed at [facility], [address] [place], ?[place] [address] ?  ? ? ?Coagulation Studies: ?No results for input(s): LABPROT, INR in the last 72 hours. ? ?Urinalysis: ?Recent Labs  ?  10/08/21 ?2104  ?COLORURINE COLORLESS*  ?LABSPEC 1.000*  ?PHURINE 8.0  ?GLUCOSEU >=500*  ?HGBUR SMALL*  ?BILIRUBINUR NEGATIVE  ?KETONESUR NEGATIVE  ?PROTEINUR NEGATIVE  ?NITRITE NEGATIVE  ?LEUKOCYTESUR NEGATIVE  ?  ? ? ?Imaging: ?No results found. ? ? ?Medications:  ? ? ? ascorbic acid  1,000 mg Oral Daily  ? atenolol  25 mg Oral BID  ? budesonide (PULMICORT) nebulizer solution  0.25 mg Nebulization BID  ? enoxaparin (LOVENOX) injection  0.5 mg/kg Subcutaneous Q24H  ? folic acid  1 mg Oral Daily  ? lisinopril  10 mg Oral Daily  ? pantoprazole  40 mg Oral Daily  ? ?albuterol, diazepam, docusate sodium, polyethylene glycol ? ?Assessment/ Plan:  ?[name] is a  71 y.o. white male Asperger's, asthma, atrial fibrillation, diverticulosis, GERD, hypertension, psoriasis who is admitted to [facility] on 10/08/2021 for Hyponatremia [E87.1] ? ? Hyponatremia: secondary to polydipsia: urine specific gravity of 1.000, urine osm of 113. Status post hypertonic saline and now overcorrected. Encourage PO intake. Low threshold to administer Dextrose infusion ? ?Hypotension: possibly hypovolemic. Holding home lisinopril and atenolol.  ? ? ? LOS: 1 ?Carl [name] ?3/1/20238:51 AM ?  ?

## 2021-10-09 NOTE — Progress Notes (Signed)
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  ?Has been drinking large amounts of water - multiple prior admits for low Na+ in this setting of polydipsia.  ? ?Plans: ?- already on 3% saline, 65ml/hr, Na up to 114 already  ?- avoid correction > 6-70meQ initially, will cut back to 12ml/hr but can defer to admitting team  ?- f/u Chem 7 to assure not overcorrecting.  ?- likely can correct quite slowly in his case due to chronic issues with the same and not with any severe CNS/CVS manifestations.  ?

## 2021-10-09 NOTE — Progress Notes (Signed)
?  Transition of Care (TOC) Screening Note ? ? ?Patient Details  ?Name: Carl [name] ?Date of Birth: 1950-12-09 ? ? ?Transition of Care (TOC) CM/SW Contact:    ?Allayne Butcher, RN ?Phone Number: ?10/09/2021, 2:43 PM ? ? ? ?Transition of Care Department [facility]) has reviewed patient and no TOC needs have been identified at this time. We will continue to monitor patient advancement through interdisciplinary progression rounds. If new patient transition needs arise, please place a TOC consult. ?  ?

## 2021-10-09 NOTE — ED Notes (Addendum)
Urinal 1 - 1110 ML ?Urnial 2 - ?Urinal 3- 1000 ml ?Urinal 4- 1000 ml ?

## 2021-10-09 NOTE — Plan of Care (Incomplete)
VSS. Afebrile. Neuro exam intact. ANOx4. Denies numbness, tingling. Reports R leg cramping; ice pack placed and PRN valium given. Continues on q4 hr Na+ checks, most recent 125.  ? ?Transferred to 1A ~2245. Belongings accounted for.  ?Problem: Education: ?Goal: Knowledge of General Education information will improve ?Description: Including pain rating scale, medication(s)/side effects and non-pharmacologic comfort measures ?Outcome: Progressing ?  ?Problem: Health Behavior/Discharge Planning: ?Goal: Ability to manage health-related needs will improve ?Outcome: Progressing ?  ?Problem: Clinical Measurements: ?Goal: Ability to maintain clinical measurements within normal limits will improve ?Outcome: Progressing ?Goal: Will remain free from infection ?Outcome: Progressing ?Goal: Diagnostic test results will improve ?Outcome: Progressing ?Goal: Respiratory complications will improve ?Outcome: Progressing ?Goal: Cardiovascular complication will be avoided ?Outcome: Progressing ?  ?Problem: Activity: ?Goal: Risk for activity intolerance will decrease ?Outcome: Progressing ?  ?Problem: Nutrition: ?Goal: Adequate nutrition will be maintained ?Outcome: Progressing ?  ?Problem: Coping: ?Goal: Level of anxiety will decrease ?Outcome: Progressing ?  ?Problem: Elimination: ?Goal: Will not experience complications related to bowel motility ?Outcome: Progressing ?Goal: Will not experience complications related to urinary retention ?Outcome: Progressing ?  ?Problem: Pain Managment: ?Goal: General experience of comfort will improve ?Outcome: Progressing ?  ?Problem: Safety: ?Goal: Ability to remain free from injury will improve ?Outcome: Progressing ?  ?Problem: Skin Integrity: ?Goal: Risk for impaired skin integrity will decrease ?Outcome: Progressing ?  ?

## 2021-10-09 NOTE — Consult Note (Signed)
PHARMACY - CRITICAL CARE PROGRESS NOTE ? ?Pharmacy Consult for Electrolytes ?Indication: Hyponatremia ?  ?No Known Allergies ? ?Patient Measurements: ?Height: 6\' 4"  (193 cm) ?Weight: 125.2 kg (276 lb 0.3 oz) ?IBW/kg (Calculated) : 86.8 ? ?Vital Signs: ?Temp: 98.5 ?F (36.9 ?C) (03/01 0734) ?Temp Source: Oral (03/01 0734) ?BP: 115/66 (03/01 0850) ?Pulse Rate: 60 (03/01 0850) ?Intake/Output from previous day: ?02/28 0701 - 03/01 0700 ?In: 145.3 [I.V.:145.3] ?Out: 4000 [Urine:4000] ?Intake/Output from this shift: ?Total I/O ?In: -  ?Out: 1050 [Urine:1050] ?Vent settings for last 24 hours: ?  ? ?Labs: ?Recent Labs  ?  10/08/21 ?1933 10/08/21 ?2104 10/09/21 ?6578 10/09/21 ?4696  ?WBC 5.0  --   --   --   ?HGB 13.8  --   --   --   ?HCT 40.1  --   --   --   ?PLT 210  --   --   --   ?CREATININE 0.48*  --  0.58* 0.68  ?MG  --  1.9  --   --   ? ? ?Estimated Creatinine Clearance: 126 mL/min (by C-G formula based on SCr of 0.68 mg/dL). ? ?Recent Labs  ?  10/09/21 ?0220  ?GLUCAP 151*  ? ? ? ?Microbiology: ?Recent Results (from the past 720 hour(s))  ?Resp Panel by RT-PCR (Flu A&B, Covid) Nasopharyngeal Swab     Status: None  ? Collection Time: 10/08/21 12:30 AM  ? Specimen: Nasopharyngeal Swab; Nasopharyngeal(NP) swabs in vial transport medium  ?Result Value Ref Range Status  ? SARS Coronavirus 2 by RT PCR NEGATIVE NEGATIVE Final  ?  Comment: (NOTE) ?SARS-CoV-2 target nucleic acids are NOT DETECTED. ? ?The SARS-CoV-2 RNA is generally detectable in upper respiratory ?specimens during the acute phase of infection. The lowest ?concentration of SARS-CoV-2 viral copies this assay can detect is ?138 copies/mL. A negative result does not preclude SARS-Cov-2 ?infection and should not be used as the sole basis for treatment or ?other patient management decisions. A negative result may occur with  ?improper specimen collection/handling, submission of specimen other ?than nasopharyngeal swab, presence of viral mutation(s) within the ?areas  targeted by this assay, and inadequate number of viral ?copies(<138 copies/mL). A negative result must be combined with ?clinical observations, patient history, and epidemiological ?information. The expected result is Negative. ? ?Fact Sheet for Patients:  ?BloggerCourse.com ? ?Fact Sheet for Healthcare Providers:  ?SeriousBroker.it ? ?This test is no t yet approved or cleared by the [place] FDA and  ?has been authorized for detection and/or diagnosis of SARS-CoV-2 by ?FDA under an Emergency Use Authorization (EUA). This EUA will remain  ?in effect (meaning this test can be used) for the duration of the ?COVID-19 declaration under Section 564(b)(1) of the Act, 21 ?U.S.C.section 360bbb-3(b)(1), unless the authorization is terminated  ?or revoked sooner.  ? ? ?  ? Influenza A by PCR NEGATIVE NEGATIVE Final  ? Influenza B by PCR NEGATIVE NEGATIVE Final  ?  Comment: (NOTE) ?The Xpert Xpress SARS-CoV-2/FLU/RSV plus assay is intended as an aid ?in the diagnosis of influenza from Nasopharyngeal swab specimens and ?should not be used as a sole basis for treatment. Nasal washings and ?aspirates are unacceptable for Xpert Xpress SARS-CoV-2/FLU/RSV ?testing. ? ?Fact Sheet for Patients: ?BloggerCourse.com ? ?Fact Sheet for Healthcare Providers: ?SeriousBroker.it ? ?This test is not yet approved or cleared by the [place] FDA and ?has been authorized for detection and/or diagnosis of SARS-CoV-2 by ?FDA under an Emergency Use Authorization (EUA). This EUA will remain ?in effect (meaning  this test can be used) for the duration of the ?COVID-19 declaration under Section 564(b)(1) of the Act, 21 U.S.C. ?section 360bbb-3(b)(1), unless the authorization is terminated or ?revoked. ? ?Performed at [facility], [address] [place], ?[place] [address] ?  ?MRSA Next Gen by PCR, Nasal     Status: None  ? Collection  Time: 10/09/21  3:38 AM  ? Specimen: Nasal Mucosa; Nasal Swab  ?Result Value Ref Range Status  ? MRSA by PCR Next Gen NOT DETECTED NOT DETECTED Final  ?  Comment: (NOTE) ?The GeneXpert MRSA Assay (FDA approved for NASAL specimens only), ?is one component of a comprehensive MRSA colonization surveillance ?program. It is not intended to diagnose MRSA infection nor to guide ?or monitor treatment for MRSA infections. ?Test performance is not FDA approved in patients less than 2 years ?old. ?Performed at [facility], [address] [place], ?[place] [address] ?  ? ? ?Medications:  ?Medications Prior to Admission  ?Medication Sig Dispense Refill Last Dose  ? albuterol (VENTOLIN HFA) 108 (90 Base) MCG/ACT inhaler Inhale 2 puffs into the lungs every 6 (six) hours as needed for wheezing.   prn at prn  ? ascorbic acid (VITAMIN C) 1000 MG tablet Take 1,000 mg by mouth daily.   Past Week  ? atenolol (TENORMIN) 25 MG tablet Take by mouth 2 (two) times daily.    Past Week  ? B Complex Vitamins (B COMPLEX PO) Take 1 tablet by mouth daily.   Past Week  ? fluticasone (FLOVENT HFA) 110 MCG/ACT inhaler Inhale 1 puff into the lungs in the morning and at bedtime.   Past Week  ? folic acid (FOLVITE) 1 MG tablet Take 1 mg by mouth daily.   Past Week  ? Garlic 100 MG TABS Take 100 mg by mouth daily.   Past Week  ? hyoscyamine (LEVBID) 0.375 MG 12 hr tablet Take 1 tablet by mouth in the morning and at bedtime.   Past Week  ? lisinopril (PRINIVIL,ZESTRIL) 10 MG tablet Take 10 mg by mouth daily.   Past Week  ? montelukast (SINGULAIR) 10 MG tablet Take 10 mg by mouth at bedtime.   Past Week  ? Multiple Vitamin (MULTIVITAMIN) tablet Take 1 tablet by mouth daily.   Past Week  ? Omega-3 Fatty Acids (SALMON OIL PO) Take 1 capsule by mouth daily.   Past Week  ? pantoprazole (PROTONIX) 40 MG tablet Take 40 mg by mouth daily.   Past Week  ? Turmeric (QC TUMERIC COMPLEX PO) Take 1 tablet by mouth daily.   Past Week  ? Zinc Citrate-Phytase  25-500 MG CAPS Take 1 capsule by mouth daily.   Past Week  ? cyclobenzaprine (FLEXERIL) 5 MG tablet Take 1 tablet (5 mg total) by mouth 3 (three) times daily as needed for muscle spasms. (Patient not taking: Reported on 10/08/2021) 30 tablet 0 Not Taking  ? ?Scheduled:  ? ascorbic acid  1,000 mg Oral Daily  ? budesonide (PULMICORT) nebulizer solution  0.25 mg Nebulization BID  ? enoxaparin (LOVENOX) injection  0.5 mg/kg Subcutaneous Q24H  ? folic acid  1 mg Oral Daily  ? pantoprazole  40 mg Oral Daily  ? ?Infusions:  ? ? ?PRN:  ? ?Assessment: ?Pharmacy has been consulted to initiate and monitor hypertonic saline(3%) infusion in 70yo patient admitted with leg cramps. He stated that he has a history of hyponatremia in the past and that his leg pain feels similar to those instances. Patient's sodium level  upon admission was 109.   ? ?Goal of Therapy:  ?Increase in Na by 4-6 mEq/L in 4-6 hours ? ?Plan:  ?--Order was placed by EDP of NaCl 3%@25ml /hr started on 10/08/21@2158  ?--Will monitor sodium levels q2h x 2 occurrences, then q4h ?--Call RN to stop infusion and notify MD if: ?Na increases by 4 mEq/L or more in the first 2 hours or ?Na increases by 6 mEq/L or more in the first 4 hours ? ?02/23:  Na @ 2104 = 114 ?03/01:  Na @ 0243 = 122  ?03/01:  Na @ 0455 = 123 ?03/01:   Na @0721  = 124 ? ?-  Most recent Na reflects a rise of 8 mEq in serum Na.  Will d/c 3% NaCl infusion for now and redraw Na level STAT to r/o false lab reading.  ? ?- 0455 Na = 123, so previous level is valid.  Will continue to follow Na Q4H.  NP and Dr aware of most recent Na but does not want to start Desmopressin ;  pt should autocorrect.  ? ?Will CTM off 3% NaCl ? ?Jamal Collin Gaylan Fauver ?10/09/2021,9:57 AM ? ? ?

## 2021-10-10 DIAGNOSIS — I959 Hypotension, unspecified: Secondary | ICD-10-CM

## 2021-10-10 LAB — BASIC METABOLIC PANEL
Anion gap: 6 (ref 5–15)
BUN: 13 mg/dL (ref 8–23)
CO2: 28 mmol/L (ref 22–32)
Calcium: 8.6 mg/dL — ABNORMAL LOW (ref 8.9–10.3)
Chloride: 93 mmol/L — ABNORMAL LOW (ref 98–111)
Creatinine, Ser: 0.77 mg/dL (ref 0.61–1.24)
GFR, Estimated: >60 mL/min (ref >60)
Glucose, Bld: 92 mg/dL (ref 70–99)
Potassium: 4.8 mmol/L (ref 3.5–5.1)
Sodium: 127 mmol/L — ABNORMAL LOW (ref 135–145)

## 2021-10-10 LAB — CBC
HCT: 42.1 % (ref 39.0–52.0)
Hemoglobin: 14 g/dL (ref 13.0–17.0)
MCH: 27 pg (ref 26.0–34.0)
MCHC: 33.3 g/dL (ref 30.0–36.0)
MCV: 81.3 fL (ref 80.0–100.0)
Platelets: 189 10*3/uL (ref 150–400)
RBC: 5.18 MIL/uL (ref 4.22–5.81)
RDW: 14.4 % (ref 11.5–15.5)
WBC: 4.8 10*3/uL (ref 4.0–10.5)
nRBC: 0 % (ref 0.0–0.2)

## 2021-10-10 LAB — SODIUM
Sodium: 126 mmol/L — ABNORMAL LOW (ref 135–145)
Sodium: 125 mmol/L — ABNORMAL LOW (ref 135–145)

## 2021-10-10 MED ORDER — SODIUM CHLORIDE 1 G PO TABS
1.0000 g | ORAL_TABLET | Freq: Three times a day (TID) | ORAL | Status: DC
Start: 2021-10-10 — End: 2021-10-13
  Administered 2021-10-10 – 2021-10-13 (×8): 1 g via ORAL
  Filled 2021-10-10 (×11): qty 1

## 2021-10-10 MED ORDER — ENOXAPARIN SODIUM 60 MG/0.6ML IJ SOSY
60.0000 mg | PREFILLED_SYRINGE | INTRAMUSCULAR | Status: DC
  Administered 2021-10-10 – 2021-10-14 (×5): 60 mg via SUBCUTANEOUS
  Filled 2021-10-10 (×5): qty 0.6

## 2021-10-10 NOTE — Progress Notes (Signed)
?[facility]  ?ROUNDING NOTE  ? ?Subjective:  ? ?[name] was admitted to [facility] on 10/08/2021 for Hyponatremia [E87.1] ? ?Patient was drinkin a great deal of water to compensate for having constipation. Patient states that he was following a fluid restriction prior to this admission.  ? ?Patient seen sitting up in bed ?More alert and conversive today. ?Tolerating meals ? ?Sodium 126 ?Recorded urine output of 2.6 L in past 24 hours ? ? ?Objective:  ?Vital signs in last 24 hours:  ?Temp:  [97.5 ?F (36.4 ?C)-98.5 ?F (36.9 ?C)] 98.1 ?F (36.7 ?C) (03/02 6979) ?Pulse Rate:  [56-98] 67 (03/02 0758) ?Resp:  [12-21] 18 (03/02 0758) ?BP: (124-156)/(55-80) 132/80 (03/02 0758) ?SpO2:  [93 %-100 %] 98 % (03/02 0758) ? ?Weight change:  ?Filed Weights  ? 10/08/21 1930 10/09/21 0233  ?Weight: 131.5 kg 125.2 kg  ? ? ?Intake/Output: ?I/O last 3 completed shifts: ?In: 385.3 [P.O.:240; I.V.:145.3] ?Out: 6600 [Urine:6600] ?  ?Intake/Output this shift: ? Total I/O ?In: -  ?Out: 400 [Urine:400] ? ?Physical Exam: ?General: NAD  ?Head: Normocephalic, atraumatic. Moist oral mucosal membranes  ?Eyes: Anicteric  ?Lungs:  Clear to auscultation, normal effort  ?Heart: Regular rate and rhythm  ?Abdomen:  Soft, nontender  ?Extremities:  no peripheral edema.  ?Neurologic: Nonfocal, moving all four extremities  ?Skin: No lesions  ?    ? ? ?Basic Metabolic Panel: ?Recent Labs  ?Lab 10/08/21 ?4801 10/08/21 ?2104 10/09/21 ?6553 10/09/21 ?7482 10/09/21 ?7078 10/09/21 ?1116 10/09/21 ?1524 10/09/21 ?1921 10/09/21 ?2153 10/10/21 ?0150 10/10/21 ?6754  ?NA 109* 114*   < > 123* 124*   < > 122* 125* 128* 127* 126*  ?K 4.5  --   --  4.5 4.9  --   --   --   --  4.8  --   ?CL 75*  --   --  87* 87*  --   --   --   --  93*  --   ?CO2 25  --   --  29 31  --   --   --   --  28  --   ?GLUCOSE 194*  --   --  155* 150*  --   --   --   --  92  --   ?BUN 11  --   --  9 9  --   --   --   --  13  --   ?CREATININE 0.48*  --   --  0.58* 0.68  --    --   --   --  0.77  --   ?CALCIUM 8.4*  --   --  8.9 8.7*  --   --   --   --  8.6*  --   ?MG  --  1.9  --   --   --   --   --   --   --   --   --   ? < > = values in this interval not displayed.  ? ? ? ?Liver Function Tests: ?No results for input(s): AST, ALT, ALKPHOS, BILITOT, PROT, ALBUMIN in the last 168 hours. ?No results for input(s): LIPASE, AMYLASE in the last 168 hours. ?No results for input(s): AMMONIA in the last 168 hours. ? ?CBC: ?Recent Labs  ?Lab 10/08/21 ?1933 10/10/21 ?0150  ?WBC 5.0 4.8  ?HGB 13.8 14.0  ?HCT 40.1 42.1  ?MCV 79.1* 81.3  ?PLT 210 189  ? ? ? ?Cardiac Enzymes: ?  No results for input(s): CKTOTAL, CKMB, CKMBINDEX, TROPONINI in the last 168 hours. ? ?BNP: ?Invalid input(s): POCBNP ? ?CBG: ?Recent Labs  ?Lab 10/09/21 ?0220  ?GLUCAP 151*  ? ? ? ?Microbiology: ?Results for orders placed or performed during the hospital encounter of 10/08/21  ?Resp Panel by RT-PCR (Flu A&B, Covid) Nasopharyngeal Swab     Status: None  ? Collection Time: 10/08/21 12:30 AM  ? Specimen: Nasopharyngeal Swab; Nasopharyngeal(NP) swabs in vial transport medium  ?Result Value Ref Range Status  ? SARS Coronavirus 2 by RT PCR NEGATIVE NEGATIVE Final  ?  Comment: (NOTE) ?SARS-CoV-2 target nucleic acids are NOT DETECTED. ? ?The SARS-CoV-2 RNA is generally detectable in upper respiratory ?specimens during the acute phase of infection. The lowest ?concentration of SARS-CoV-2 viral copies this assay can detect is ?138 copies/mL. A negative result does not preclude SARS-Cov-2 ?infection and should not be used as the sole basis for treatment or ?other patient management decisions. A negative result may occur with  ?improper specimen collection/handling, submission of specimen other ?than nasopharyngeal swab, presence of viral mutation(s) within the ?areas targeted by this assay, and inadequate number of viral ?copies(<138 copies/mL). A negative result must be combined with ?clinical observations, patient history, and  epidemiological ?information. The expected result is Negative. ? ?Fact Sheet for Patients:  ?BloggerCourse.com ? ?Fact Sheet for Healthcare Providers:  ?SeriousBroker.it ? ?This test is no t yet approved or cleared by the [place] FDA and  ?has been authorized for detection and/or diagnosis of SARS-CoV-2 by ?FDA under an Emergency Use Authorization (EUA). This EUA will remain  ?in effect (meaning this test can be used) for the duration of the ?COVID-19 declaration under Section 564(b)(1) of the Act, 21 ?U.S.C.section 360bbb-3(b)(1), unless the authorization is terminated  ?or revoked sooner.  ? ? ?  ? Influenza A by PCR NEGATIVE NEGATIVE Final  ? Influenza B by PCR NEGATIVE NEGATIVE Final  ?  Comment: (NOTE) ?The Xpert Xpress SARS-CoV-2/FLU/RSV plus assay is intended as an aid ?in the diagnosis of influenza from Nasopharyngeal swab specimens and ?should not be used as a sole basis for treatment. Nasal washings and ?aspirates are unacceptable for Xpert Xpress SARS-CoV-2/FLU/RSV ?testing. ? ?Fact Sheet for Patients: ?BloggerCourse.com ? ?Fact Sheet for Healthcare Providers: ?SeriousBroker.it ? ?This test is not yet approved or cleared by the [place] FDA and ?has been authorized for detection and/or diagnosis of SARS-CoV-2 by ?FDA under an Emergency Use Authorization (EUA). This EUA will remain ?in effect (meaning this test can be used) for the duration of the ?COVID-19 declaration under Section 564(b)(1) of the Act, 21 U.S.C. ?section 360bbb-3(b)(1), unless the authorization is terminated or ?revoked. ? ?Performed at [facility], [address] [place], ?[place] [address] ?  ?MRSA Next Gen by PCR, Nasal     Status: None  ? Collection Time: 10/09/21  3:38 AM  ? Specimen: Nasal Mucosa; Nasal Swab  ?Result Value Ref Range Status  ? MRSA by PCR Next Gen NOT DETECTED NOT DETECTED Final  ?  Comment:  (NOTE) ?The GeneXpert MRSA Assay (FDA approved for NASAL specimens only), ?is one component of a comprehensive MRSA colonization surveillance ?program. It is not intended to diagnose MRSA infection nor to guide ?or monitor treatment for MRSA infections. ?Test performance is not FDA approved in patients less than 2 years ?old. ?Performed at [facility], [address] [place], ?[place] [address] ?  ? ? ?Coagulation Studies: ?No results for input(s): LABPROT, INR in the last 72  hours. ? ?Urinalysis: ?Recent Labs  ?  10/08/21 ?2104  ?COLORURINE COLORLESS*  ?LABSPEC 1.000*  ?PHURINE 8.0  ?GLUCOSEU >=500*  ?HGBUR SMALL*  ?BILIRUBINUR NEGATIVE  ?KETONESUR NEGATIVE  ?PROTEINUR NEGATIVE  ?NITRITE NEGATIVE  ?LEUKOCYTESUR NEGATIVE  ? ?  ? ? ?Imaging: ?No results found. ? ? ?Medications:  ? ? ? ascorbic acid  1,000 mg Oral Daily  ? budesonide (PULMICORT) nebulizer solution  0.25 mg Nebulization BID  ? Chlorhexidine Gluconate Cloth  6 each Topical Daily  ? enoxaparin (LOVENOX) injection  60 mg Subcutaneous Q24H  ? folic acid  1 mg Oral Daily  ? pantoprazole  40 mg Oral Daily  ? ?albuterol, diazepam, docusate sodium, polyethylene glycol ? ?Assessment/ Plan:  ?[name] is a 71 y.o. white male Asperger's, asthma, atrial fibrillation, diverticulosis, GERD, hypertension, psoriasis who is admitted to [facility] on 10/08/2021 for Hyponatremia [E87.1] ? ? Hyponatremia: secondary to polydipsia: urine specific gravity of 1.000, urine osm of 113. Status post hypertonic saline and now overcorrected. Low threshold to administer Dextrose infusion ? Continue to encourage oral intake ? Will prescribe oral sodium chloride tabs 1 g 3 times daily ? Will order serum sodium checks every 12 hours ? ?Hypotension: possibly hypovolemic. Holding home lisinopril and atenolol.  ? ? ? LOS: 2 ?Carl [name] ?3/2/202311:13 AM ?  ?

## 2021-10-10 NOTE — Progress Notes (Signed)
?Progress Note ? ? ?Patient: Carl [name] XBJ:[id number] DOB: 1950/11/09 DOA: 10/08/2021     2 ?DOS: the patient was seen and examined on 10/10/2021 ?  ?Brief hospital course: ?Carl [name] is a 71 yo male with PMH as Asperger's syndrome, A-fib, HTN, recurrent hyponatremia who again presented with symptomatic hyponatremia.  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 ?Recent hospitalizations include 04/2021, 06/2021, and 07/2021.  He is typically placed on fluid restriction which improves sodium levels.  ?During this hospitalization he again presented with leg cramps and excessive water intake.  Sodium on admission found to be 109. ?He was placed on hypertonic saline and nephrology was also consulted on admission. ?He had rapid correction and sodium level continued to be monitored which ultimately stabilized. ? ?Assessment and Plan: ?* Hyponatremia ?- Recurrent severe hyponatremia, 109 on admission ?- Treated with 3% saline with rapid correction which was then discontinued ?- Sodium level 126 this morning and stabilizing.  No dextrose has been administered thus far.  He is awake, alert, oriented and has improved during hospitalization so far ?- Continue ongoing fluid restriction ?- Continue trending sodium levels ? ?Hypotension ?- Lisinopril and atenolol on hold ? ?Asperger's syndrome ?- patient live's alone; given recurrent hospitalizations for similar hyponatremia, will see if any avail resources for patient at home  ? ?Atrial fibrillation (HCC) ?- Atenolol on hold in setting of hypotension ?-No anticoagulation noted on home med rec ? ? ? ? ? ?Subjective: Sitting in recliner this morning.  His speech was pressured and he was fixated on the thought of getting the recliner foot down. He otherwise was comfortable appearing and in NAD.  ? ?Physical Exam: ?Vitals:  ? 10/09/21 2314 10/10/21 0452 10/10/21 0758 10/10/21 1113  ?BP: (!) 141/71 (!) 155/64  132/80 (!) 146/69  ?Pulse: 67 63 67 79  ?Resp: 20 16 18 18   ?Temp: 98.4 ?F (36.9 ?C) 98.2 ?F (36.8 ?C) 98.1 ?F (36.7 ?C) 98.4 ?F (36.9 ?C)  ?TempSrc:      ?SpO2: 100% 93% 98% 97%  ?Weight:      ?Height:      ? ?Physical Exam ?Constitutional:   ?   General: He is not in acute distress. ?   Appearance: Normal appearance. He is obese.  ?HENT:  ?   Head: Normocephalic and atraumatic.  ?   Mouth/Throat:  ?   Mouth: Mucous membranes are moist.  ?Eyes:  ?   Extraocular Movements: Extraocular movements intact.  ?Cardiovascular:  ?   Rate and Rhythm: Normal rate and regular rhythm.  ?   Heart sounds: Normal heart sounds.  ?Pulmonary:  ?   Effort: Pulmonary effort is normal. No respiratory distress.  ?   Breath sounds: Normal breath sounds. No wheezing.  ?Abdominal:  ?   General: Bowel sounds are normal. There is no distension.  ?   Palpations: Abdomen is soft.  ?   Tenderness: There is no abdominal tenderness.  ?Musculoskeletal:     ?   General: Normal range of motion.  ?   Cervical back: Normal range of motion and neck supple.  ?Skin: ?   General: Skin is warm and dry.  ?Neurological:  ?   General: No focal deficit present.  ?   Mental Status: He is alert.  ?Psychiatric:  ?   Comments: Mildly pressured speech  ? ? ? ?Data Reviewed: ?Results for orders placed or performed during the hospital encounter of 10/08/21 (from the past 24  hour(s))  ?Sodium     Status: Abnormal  ? Collection Time: 10/09/21  3:24 PM  ?Result Value Ref Range  ? Sodium 122 (L) 135 - 145 mmol/L  ?Sodium     Status: Abnormal  ? Collection Time: 10/09/21  7:21 PM  ?Result Value Ref Range  ? Sodium 125 (L) 135 - 145 mmol/L  ?Sodium     Status: Abnormal  ? Collection Time: 10/09/21  9:53 PM  ?Result Value Ref Range  ? Sodium 128 (L) 135 - 145 mmol/L  ?CBC     Status: None  ? Collection Time: 10/10/21  1:50 AM  ?Result Value Ref Range  ? WBC 4.8 4.0 - 10.5 K/uL  ? RBC 5.18 4.22 - 5.81 MIL/uL  ? Hemoglobin 14.0 13.0 - 17.0 g/dL  ? HCT 42.1 39.0 - 52.0 %  ? MCV  81.3 80.0 - 100.0 fL  ? MCH 27.0 26.0 - 34.0 pg  ? MCHC 33.3 30.0 - 36.0 g/dL  ? RDW 14.4 11.5 - 15.5 %  ? Platelets 189 150 - 400 K/uL  ? nRBC 0.0 0.0 - 0.2 %  ?Basic metabolic panel     Status: Abnormal  ? Collection Time: 10/10/21  1:50 AM  ?Result Value Ref Range  ? Sodium 127 (L) 135 - 145 mmol/L  ? Potassium 4.8 3.5 - 5.1 mmol/L  ? Chloride 93 (L) 98 - 111 mmol/L  ? CO2 28 22 - 32 mmol/L  ? Glucose, Bld 92 70 - 99 mg/dL  ? BUN 13 8 - 23 mg/dL  ? Creatinine, Ser 0.77 0.61 - 1.24 mg/dL  ? Calcium 8.6 (L) 8.9 - 10.3 mg/dL  ? GFR, Estimated >60 >60 mL/min  ? Anion gap 6 5 - 15  ?Sodium     Status: Abnormal  ? Collection Time: 10/10/21  6:07 AM  ?Result Value Ref Range  ? Sodium 126 (L) 135 - 145 mmol/L  ?  ?I have Reviewed nursing notes, Vitals, and Lab results since pt's last encounter. Pertinent lab results : see above ?I have ordered test including BMP, CBC, Mg ?I have reviewed the last note from staff over past 24 hours ?I have discussed pt's care plan and test results with nursing staff, case manager ? ? ?Family Communication:  ? ?Disposition: ?Status is: Inpatient ?Remains inpatient appropriate because: Treatment as outlined in A&P ? ? ? Planned Discharge Destination: Home with Home Health ? ?Antimicrobials: ? ? ?Consultants: ?Nephrology ? ?Procedures:  ? ? ?DVT ppx:  ?Lovenox ? ? ?  Code Status: Full Code  ? ? ?Author: ?Lewie Chamber, MD ?10/10/2021 2:00 PM ? ?For on call review [email or web address].  ? ?

## 2021-10-10 NOTE — Assessment & Plan Note (Addendum)
-   patient live's alone; no extra resources avail at home, but noted patient continues to have recurrent hospitalizations for recurrent hyponatremia ?

## 2021-10-10 NOTE — Assessment & Plan Note (Addendum)
-   Lisinopril and atenolol on hold ?- on coreg as BP has improved ?

## 2021-10-10 NOTE — TOC Initial Note (Signed)
Transition of Care (TOC) - Initial/Assessment Note  ? ? ?Patient Details  ?Name: Carl [name] ?MRN: [id number] ?Date of Birth: 04/16/1951 ? ?Transition of Care (TOC) CM/SW Contact:    ?Brentton Wardlow A Dennisse Swader, LCSW ?Phone Number: ?10/10/2021, 3:51 PM ? ?Clinical Narrative:     CSW spoke with pt and at this time pt states he wants to think about HH. CSW explained the process and how CSW would need to set this up prior to dc and he states he will think about it.             ? ? ?Expected Discharge Plan: Home/Self Care ?Barriers to Discharge: Continued Medical Work up ? ? ?Patient Goals and CMS Choice ?Patient states their goals for this hospitalization and ongoing recovery are:: to go home ?  ?  ? ?Expected Discharge Plan and Services ?Expected Discharge Plan: Home/Self Care ?  ?  ?  ?Living arrangements for the past 2 months: Single Family Home ?                ?  ?  ?  ?  ?  ?  ?  ?  ?  ?  ? ?Prior Living Arrangements/Services ?Living arrangements for the past 2 months: Single Family Home ?Lives with:: Self ?Patient language and need for interpreter reviewed:: Yes ?       ?Need for Family Participation in Patient Care: Yes (Comment) ?Care giver support system in place?: Yes (comment) ?  ?Criminal Activity/Legal Involvement Pertinent to Current Situation/Hospitalization: No - Comment as needed ? ?Activities of Daily Living ?Home Assistive Devices/Equipment: None ?ADL Screening (condition at time of admission) ?Patient's cognitive ability adequate to safely complete daily activities?: Yes ?Is the patient deaf or have difficulty hearing?: No ?Does the patient have difficulty seeing, even when wearing glasses/contacts?: No ?Does the patient have difficulty concentrating, remembering, or making decisions?: No ?Patient able to express need for assistance with ADLs?: Yes ?Does the patient have difficulty dressing or bathing?: No ?Independently performs ADLs?: Yes (appropriate for developmental age) ?Does the patient have  difficulty walking or climbing stairs?: No ?Weakness of Legs: None ?Weakness of Arms/Hands: None ? ?Permission Sought/Granted ?Permission sought to share information with : Family Supports ?  ? Share Information with NAME: Molly Maduro ?   ? Permission granted to share info w Relationship: brother ?   ? ?Emotional Assessment ?Appearance:: Appears stated age ?Attitude/Demeanor/Rapport: Engaged ?Affect (typically observed): Accepting ?Orientation: : Oriented to  Time, Oriented to Place, Oriented to Self, Oriented to Situation ?Alcohol / Substance Use: Not Applicable ?Psych Involvement: No (comment) ? ?Admission diagnosis:  Hyponatremia [E87.1] ?Patient Active Problem List  ? Diagnosis Date Noted  ? Hypotension 10/10/2021  ? GERD (gastroesophageal reflux disease)   ? 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 Acute hyponatremia 04/28/2021  ? Constipation 04/28/2021  ? Atrial fibrillation (HCC)   ? Asperger's syndrome   ? Hyponatremia   ? Hypertension   ? Asthma   ? Postural dizziness with presyncope   ? Type 2 diabetes mellitus without complication, without long-term current use of insulin (HCC) 06/23/2018  ? ?PCP:  Gracelyn Nurse, MD ?Pharmacy:   ?[facility] - [place], [place] - [address] ?[address] ?[address] ?[place] [address] ?Phone: [phone] Fax: [phone] ? ? ? ? ?Social Determinants of Health (SDOH) Interventions ?  ? ?Readmission Risk Interventions ?No flowsheet data found. ? ? ?

## 2021-10-10 NOTE — Assessment & Plan Note (Signed)
-   Atenolol on hold in setting of hypotension ?-No anticoagulation noted on home med rec ?

## 2021-10-10 NOTE — Evaluation (Signed)
Physical Therapy Evaluation ?Patient Details ?Name: Carl [name] ?MRN: [id number] ?DOB: 07-25-1951 ?Today's Date: 10/10/2021 ? ?History of Present Illness ? Patient is a 71 year old male who [facility] to [facility] on 2/[facility] with c/o of bilateral leg cramps and weakness. 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 ?  ?Clinical Impression ? Physical Therapy Evaluation completed this date. Patient was agreeable to treatment and tolerated session well. Very motivated to participate in therapy. States he wants to get up multiple times a day. Patient educated on calling RN if he would like to get up if therapy is not seeing him. Patient states he lives alone in a ranch style home, and was independent with all mobility and ADLs prior to administration. Patient demonstrates only mild unsteadiness in static and dynamic standing, however demonstrates good stepping strategies to maintain stable and upright. Bed mobility was not assessed as patient started and ended session in recliner. Sit to stand transfers, ambulation, and stairs were completed at SBA/ supervision for safety. Patient required intermittent standing recovery breaks due to fatigue and high respiratory rate. Patient left in recliner with RN present. Patient is highly motivated to particiate in therapy, and would benefit from continued physical therapy in order to optimize patient safety at home, decrease risk of falls, and increase independence with ADLs.  ?   ? ?Recommendations for follow up therapy are one component of a multi-disciplinary discharge planning process, led by the attending physician.  Recommendations may be updated based on patient status, additional functional criteria and insurance authorization. ? ?Follow Up Recommendations Home health PT ? ?  ?Assistance Recommended at Discharge PRN  ?Patient can return home with the following ? A little help with walking and/or transfers ? ?  ?Equipment  Recommendations None recommended by PT (at this time)  ?Recommendations for Other Services ?    ?  ?Functional Status Assessment Patient has had a recent decline in their functional status and demonstrates the ability to make significant improvements in function in a reasonable and predictable amount of time.  ? ?  ?Precautions / Restrictions Precautions ?Precautions: Fall ?Restrictions ?Weight Bearing Restrictions: No  ? ?  ? ?Mobility ? Bed Mobility ?Overal bed mobility:  (Not assessed as patient started and ended session in recliner) ?  ?  ?  ?  ?  ?  ?  ?Patient Response: Cooperative ? ?Transfers ?Overall transfer level: Needs assistance ?Equipment used: None ?Transfers: Sit to/from Stand ?Sit to Stand: Supervision ?  ?  ?  ?  ?  ?General transfer comment: Supervision for safety due to mild unsteadiness in standing ?  ? ?Ambulation/Gait ?Ambulation/Gait assistance: Supervision (SBA) ?Gait Distance (Feet): 300 Feet ?Assistive device: None ?Gait Pattern/deviations: Step-through pattern, Decreased step length - right, Decreased step length - left, Narrow base of support ?Gait velocity: decreased ?  ?  ?General Gait Details: mild unsteadiness noted, however no LOB occured, good steppage gait to maintain stable/ upright, standing rest breaks throughout ambulation due to increased RR 20-45 and cueing on pursed lip breathing, SpO2 >90% throughout session. HR ranged from 75-97bpm with functional activity ? ?Stairs ?Stairs: Yes ?Stairs assistance: Supervision ?Stair Management: Two rails ?Number of Stairs: 4 ?General stair comments: step through pattern ? ?Wheelchair Mobility ?  ? ?Modified Rankin (Stroke Patients Only) ?  ? ?  ? ?Balance Overall balance assessment: Needs assistance ?Sitting-balance support: Feet supported, No upper extremity supported ?Sitting balance-Leahy Scale: Normal ?  ?  ?Standing balance support: No upper extremity supported,  During functional activity ?Standing balance-Leahy Scale:  Fair ?Standing balance comment: mild unsteadiness with static and dynamic balance tasks, patient demonstrates good stepping strategies to maintain stable and upright ?  ?  ?  ?  ?  ?  ?  ?  ?  ?  ?  ?   ? ? ? ?Pertinent Vitals/Pain Pain Assessment ?Pain Assessment: No/denies pain ?Pain Intervention(s): Monitored during session  ? ? ?Home Living Family/patient expects to be discharged to:: Private residence ?Living Arrangements: Alone ?Available Help at Discharge: Neighbor ?Type of Home: House ?Home Access: Stairs to enter ?Entrance Stairs-Rails: Right;Left;Can reach both ?Entrance Stairs-Number of Steps: 2-3 ?  ?Home Layout: One level ?Home Equipment: None ?   ?  ?Prior Function Prior Level of Function : Independent/Modified Independent ?  ?  ?  ?  ?  ?  ?  ?  ?  ? ? ?Hand Dominance  ? Dominant Hand: Right ? ?  ?Extremity/Trunk Assessment  ? Upper Extremity Assessment ?Upper Extremity Assessment: Overall WFL for tasks assessed ?  ? ?Lower Extremity Assessment ?Lower Extremity Assessment: Generalized weakness (At least 3+/5 in BLEs) ?  ? ?   ?Communication  ? Communication: No difficulties  ?Cognition   ?Behavior During Therapy: [facility] for tasks assessed/performed ?Overall Cognitive Status: Within Functional Limits for tasks assessed ?  ?  ?  ?  ?  ?  ?  ?  ?  ?  ?  ?  ?  ?  ?  ?  ?General Comments: A&Ox3- self, situation, location ?  ?  ? ?  ?General Comments   ? ?  ?Exercises Other Exercises ?Other Exercises: patient educated on role of PT in acute setting, fall risk, and d/c plans  ? ?Assessment/Plan  ?  ?PT Assessment Patient needs continued PT services  ?PT Problem List Decreased strength;Decreased balance;Decreased activity tolerance ? ?   ?  ?PT Treatment Interventions Balance training;Patient/family education;Therapeutic activities;Therapeutic exercise;Gait training;Stair training   ? ?PT Goals (Current goals can be found in the Care Plan section)  ?Acute Rehab PT Goals ?Patient Stated Goal: to get better "I  should be getting up and walking at least 3x/day so I dont leave here worse than I came" ?PT Goal Formulation: With patient ?Time For Goal Achievement: 10/24/21 ?Potential to Achieve Goals: Good ? ?  ?Frequency Min 2X/week ?  ? ? ?Co-evaluation   ?  ?  ?  ?  ? ? ?  ?AM-PAC PT "6 Clicks" Mobility  ?Outcome Measure Help needed turning from your back to your side while in a flat bed without using bedrails?: A Little ?Help needed moving from lying on your back to sitting on the side of a flat bed without using bedrails?: A Little ?Help needed moving to and from a bed to a chair (including a wheelchair)?: A Little ?Help needed standing up from a chair using your arms (e.g., wheelchair or bedside chair)?: A Little ?Help needed to walk in hospital room?: A Little ?Help needed climbing 3-5 steps with a railing? : A Little ?6 Click Score: 18 ? ?  ?End of Session Equipment Utilized During Treatment: Gait belt ?Activity Tolerance: Patient tolerated treatment well ?Patient left: in chair;with nursing/sitter in room ?Nurse Communication: Mobility status ?PT Visit Diagnosis: Unsteadiness on feet (R26.81);Muscle weakness (generalized) (M62.81) ?  ? ?Time: [id number] ?PT Time Calculation (min) (ACUTE ONLY): 23 min ? ? ?Charges:   PT Evaluation ?$PT Eval Low Complexity: 1 Low ?PT Treatments ?$Gait Training: 8-22 mins ?  ?   ? ? ?  Iva Boop, PT  ?10/10/21. 12:19 PM ? ? ?

## 2021-10-10 NOTE — Assessment & Plan Note (Addendum)
-   Recurrent severe hyponatremia, 109 on admission ?- Treated with 3% saline with rapid correction which was then discontinued ?-Salt tablets also started per nephrology ?-Continue oral fluid restriction ?- Continue trending sodium levels ?- goal Na>130 per nephrology before stable for d/c ?

## 2021-10-10 NOTE — Hospital Course (Signed)
Carl [name] is a 71 yo male with PMH as Asperger's syndrome, A-fib, HTN, recurrent hyponatremia who again presented with symptomatic hyponatremia.  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 ?Recent hospitalizations include 04/2021, 06/2021, and 07/2021.  He is typically placed on fluid restriction which improves sodium levels.  ?During this hospitalization he again presented with leg cramps and excessive water intake.  Sodium on admission found to be 109. ?He was placed on hypertonic saline and nephrology was also consulted on admission. ?He had rapid correction and sodium level continued to be monitored which ultimately stabilized. ?

## 2021-10-11 DIAGNOSIS — F845 Asperger's syndrome: Secondary | ICD-10-CM

## 2021-10-11 LAB — SODIUM
Sodium: 125 mmol/L — ABNORMAL LOW (ref 135–145)
Sodium: 127 mmol/L — ABNORMAL LOW (ref 135–145)

## 2021-10-11 LAB — CBC
HCT: 40.8 % (ref 39.0–52.0)
Hemoglobin: 13.7 g/dL (ref 13.0–17.0)
MCH: 27.3 pg (ref 26.0–34.0)
MCHC: 33.6 g/dL (ref 30.0–36.0)
MCV: 81.3 fL (ref 80.0–100.0)
Platelets: 220 10*3/uL (ref 150–400)
RBC: 5.02 MIL/uL (ref 4.22–5.81)
RDW: 14.6 % (ref 11.5–15.5)
WBC: 6.3 10*3/uL (ref 4.0–10.5)
nRBC: 0 % (ref 0.0–0.2)

## 2021-10-11 LAB — BASIC METABOLIC PANEL
Anion gap: 6 (ref 5–15)
BUN: 17 mg/dL (ref 8–23)
CO2: 26 mmol/L (ref 22–32)
Calcium: 8.8 mg/dL — ABNORMAL LOW (ref 8.9–10.3)
Chloride: 93 mmol/L — ABNORMAL LOW (ref 98–111)
Creatinine, Ser: 0.77 mg/dL (ref 0.61–1.24)
GFR, Estimated: >60 mL/min (ref >60)
Glucose, Bld: 113 mg/dL — ABNORMAL HIGH (ref 70–99)
Potassium: 4.8 mmol/L (ref 3.5–5.1)
Sodium: 125 mmol/L — ABNORMAL LOW (ref 135–145)

## 2021-10-11 LAB — MAGNESIUM: Magnesium: 2.1 mg/dL (ref 1.7–2.4)

## 2021-10-11 MED ORDER — SODIUM CHLORIDE 0.9 % IV SOLN: INTRAVENOUS | Status: AC

## 2021-10-11 NOTE — Plan of Care (Signed)
Patient sleeping between care. Aox4. No complaints of pain or discomfort. No new changes in assessment. Call bell within reach. ? ? ? ?Problem: Education: ?Goal: Knowledge of General Education information will improve ?Description: Including pain rating scale, medication(s)/side effects and non-pharmacologic comfort measures ?Outcome: Progressing ?  ?Problem: Health Behavior/Discharge Planning: ?Goal: Ability to manage health-related needs will improve ?Outcome: Progressing ?  ?Problem: Clinical Measurements: ?Goal: Ability to maintain clinical measurements within normal limits will improve ?Outcome: Progressing ?Goal: Will remain free from infection ?Outcome: Progressing ?Goal: Diagnostic test results will improve ?Outcome: Progressing ?Goal: Respiratory complications will improve ?Outcome: Progressing ?Goal: Cardiovascular complication will be avoided ?Outcome: Progressing ?  ?Problem: Activity: ?Goal: Risk for activity intolerance will decrease ?Outcome: Progressing ?  ?Problem: Nutrition: ?Goal: Adequate nutrition will be maintained ?Outcome: Progressing ?  ?Problem: Coping: ?Goal: Level of anxiety will decrease ?Outcome: Progressing ?  ?Problem: Elimination: ?Goal: Will not experience complications related to bowel motility ?Outcome: Progressing ?Goal: Will not experience complications related to urinary retention ?Outcome: Progressing ?  ?Problem: Pain Managment: ?Goal: General experience of comfort will improve ?Outcome: Progressing ?  ?Problem: Safety: ?Goal: Ability to remain free from injury will improve ?Outcome: Progressing ?  ?Problem: Skin Integrity: ?Goal: Risk for impaired skin integrity will decrease ?Outcome: Progressing ?  ?

## 2021-10-11 NOTE — Progress Notes (Addendum)
?[facility]  ?ROUNDING NOTE  ? ?Subjective:  ? ?[name] was admitted to [facility] on 10/08/2021 for Hyponatremia [E87.1] ? ?Patient was drinkin a great deal of water to compensate for having constipation. Patient states that he was following a fluid restriction prior to this admission.  ? ?Patient sitting up in chair ?Concerned about missing music channels on television ?Remains on room air ? ?Sodium 125 ?Recorded urine output of 1.8 L in past 24 hours ? ? ?Objective:  ?Vital signs in last 24 hours:  ?Temp:  [98.2 ?F (36.8 ?C)-98.4 ?F (36.9 ?C)] 98.2 ?F (36.8 ?C) (03/03 [id number]) ?Pulse Rate:  [69-81] 81 (03/03 0752) ?Resp:  [14-18] 14 (03/03 0752) ?BP: (124-150)/(63-78) 150/67 (03/03 0752) ?SpO2:  [94 %-100 %] 94 % (03/03 0752) ? ?Weight change:  ?Filed Weights  ? 10/08/21 1930 10/09/21 0233  ?Weight: 131.5 kg 125.2 kg  ? ? ?Intake/Output: ?I/O last 3 completed shifts: ?In: 360 [P.O.:360] ?Out: 2500 [Urine:2500] ?  ?Intake/Output this shift: ? Total I/O ?In: 240 [P.O.:240] ?Out: -  ? ?Physical Exam: ?General: NAD  ?Head: Normocephalic, atraumatic. Moist oral mucosal membranes  ?Eyes: Anicteric  ?Lungs:  Clear to auscultation, normal effort  ?Heart: Regular rate and rhythm  ?Abdomen:  Soft, nontender  ?Extremities:  no peripheral edema.  ?Neurologic: Nonfocal, moving all four extremities  ?Skin: No lesions  ?    ? ? ?Basic Metabolic Panel: ?Recent Labs  ?Lab 10/08/21 ?[id number] 10/08/21 ?2104 10/09/21 ?[id number] 10/09/21 ?[id number] 10/09/21 ?[id number] 10/09/21 ?1116 10/10/21 ?0150 10/10/21 ?[id number] 10/10/21 ?[id number] 10/11/21 ?[id number] 10/11/21 ?[id number]  ?NA 109* 114*   < > 123* 124*   < > 127* 126* 125* 125* 125*  ?K 4.5  --   --  4.5 4.9  --  4.8  --   --  4.8  --   ?CL 75*  --   --  87* 87*  --  93*  --   --  93*  --   ?CO2 25  --   --  29 31  --  28  --   --  26  --   ?GLUCOSE 194*  --   --  155* 150*  --  92  --   --  113*  --   ?BUN 11  --   --  9 9  --  13  --   --  17  --   ?CREATININE 0.48*  --   --  0.58* 0.68  --   0.77  --   --  0.77  --   ?CALCIUM 8.4*  --   --  8.9 8.7*  --  8.6*  --   --  8.8*  --   ?MG  --  1.9  --   --   --   --   --   --   --  2.1  --   ? < > = values in this interval not displayed.  ? ? ? ?Liver Function Tests: ?No results for input(s): AST, ALT, ALKPHOS, BILITOT, PROT, ALBUMIN in the last 168 hours. ?No results for input(s): LIPASE, AMYLASE in the last 168 hours. ?No results for input(s): AMMONIA in the last 168 hours. ? ?CBC: ?Recent Labs  ?Lab 10/08/21 ?1933 10/10/21 ?0150 10/11/21 ?[id number]  ?WBC 5.0 4.8 6.3  ?HGB 13.8 14.0 13.7  ?HCT 40.1 42.1 40.8  ?MCV 79.1* 81.3 81.3  ?PLT 210 189 220  ? ? ? ?Cardiac Enzymes: ?No results for input(s): CKTOTAL, CKMB, CKMBINDEX,  TROPONINI in the last 168 hours. ? ?BNP: ?Invalid input(s): POCBNP ? ?CBG: ?Recent Labs  ?Lab 10/09/21 ?0220  ?GLUCAP 151*  ? ? ? ?Microbiology: ?Results for orders placed or performed during the hospital encounter of 10/08/21  ?Resp Panel by RT-PCR (Flu A&B, Covid) Nasopharyngeal Swab     Status: None  ? Collection Time: 10/08/21 12:30 AM  ? Specimen: Nasopharyngeal Swab; Nasopharyngeal(NP) swabs in vial transport medium  ?Result Value Ref Range Status  ? SARS Coronavirus 2 by RT PCR NEGATIVE NEGATIVE Final  ?  Comment: (NOTE) ?SARS-CoV-2 target nucleic acids are NOT DETECTED. ? ?The SARS-CoV-2 RNA is generally detectable in upper respiratory ?specimens during the acute phase of infection. The lowest ?concentration of SARS-CoV-2 viral copies this assay can detect is ?138 copies/mL. A negative result does not preclude SARS-Cov-2 ?infection and should not be used as the sole basis for treatment or ?other patient management decisions. A negative result may occur with  ?improper specimen collection/handling, submission of specimen other ?than nasopharyngeal swab, presence of viral mutation(s) within the ?areas targeted by this assay, and inadequate number of viral ?copies(<138 copies/mL). A negative result must be combined with ?clinical  observations, patient history, and epidemiological ?information. The expected result is Negative. ? ?Fact Sheet for Patients:  ?BloggerCourse.com ? ?Fact Sheet for Healthcare Providers:  ?SeriousBroker.it ? ?This test is no t yet approved or cleared by the [place] FDA and  ?has been authorized for detection and/or diagnosis of SARS-CoV-2 by ?FDA under an Emergency Use Authorization (EUA). This EUA will remain  ?in effect (meaning this test can be used) for the duration of the ?COVID-19 declaration under Section 564(b)(1) of the Act, 21 ?U.S.C.section 360bbb-3(b)(1), unless the authorization is terminated  ?or revoked sooner.  ? ? ?  ? Influenza A by PCR NEGATIVE NEGATIVE Final  ? Influenza B by PCR NEGATIVE NEGATIVE Final  ?  Comment: (NOTE) ?The Xpert Xpress SARS-CoV-2/FLU/RSV plus assay is intended as an aid ?in the diagnosis of influenza from Nasopharyngeal swab specimens and ?should not be used as a sole basis for treatment. Nasal washings and ?aspirates are unacceptable for Xpert Xpress SARS-CoV-2/FLU/RSV ?testing. ? ?Fact Sheet for Patients: ?BloggerCourse.com ? ?Fact Sheet for Healthcare Providers: ?SeriousBroker.it ? ?This test is not yet approved or cleared by the [place] FDA and ?has been authorized for detection and/or diagnosis of SARS-CoV-2 by ?FDA under an Emergency Use Authorization (EUA). This EUA will remain ?in effect (meaning this test can be used) for the duration of the ?COVID-19 declaration under Section 564(b)(1) of the Act, 21 U.S.C. ?section 360bbb-3(b)(1), unless the authorization is terminated or ?revoked. ? ?Performed at [facility], [address] [place], ?[place] [address] ?  ?MRSA Next Gen by PCR, Nasal     Status: None  ? Collection Time: 10/09/21  3:38 AM  ? Specimen: Nasal Mucosa; Nasal Swab  ?Result Value Ref Range Status  ? MRSA by PCR Next Gen NOT DETECTED  NOT DETECTED Final  ?  Comment: (NOTE) ?The GeneXpert MRSA Assay (FDA approved for NASAL specimens only), ?is one component of a comprehensive MRSA colonization surveillance ?program. It is not intended to diagnose MRSA infection nor to guide ?or monitor treatment for MRSA infections. ?Test performance is not FDA approved in patients less than 2 years ?old. ?Performed at [facility], [address] [place], ?[place] [address] ?  ? ? ?Coagulation Studies: ?No results for input(s): LABPROT, INR in the last 72 hours. ? ?Urinalysis: ?Recent Labs  ?  10/08/21 ?2104  ?COLORURINE COLORLESS*  ?LABSPEC 1.000*  ?PHURINE 8.0  ?GLUCOSEU >=500*  ?HGBUR SMALL*  ?BILIRUBINUR NEGATIVE  ?KETONESUR NEGATIVE  ?PROTEINUR NEGATIVE  ?NITRITE NEGATIVE  ?LEUKOCYTESUR NEGATIVE  ? ?  ? ? ?Imaging: ?No results found. ? ? ?Medications:  ? ? sodium chloride    ? ? ascorbic acid  1,000 mg Oral Daily  ? budesonide (PULMICORT) nebulizer solution  0.25 mg Nebulization BID  ? Chlorhexidine Gluconate Cloth  6 each Topical Daily  ? enoxaparin (LOVENOX) injection  60 mg Subcutaneous [id number]  ? folic acid  1 mg Oral Daily  ? pantoprazole  40 mg Oral Daily  ? sodium chloride  1 g Oral TID WC  ? ?albuterol, diazepam, docusate sodium, polyethylene glycol ? ?Assessment/ Plan:  ?Mr. Kory Mosteller is a 71 y.o. white male Asperger's, asthma, atrial fibrillation, diverticulosis, GERD, hypertension, psoriasis who is admitted to [facility] on 10/08/2021 for Hyponatremia [E87.1] ? ? Hyponatremia: secondary to polydipsia: urine specific gravity of 1.000, urine osm of 113. Status post hypertonic saline and now overcorrected. Low threshold to administer Dextrose infusion ? Continue to encourage oral intake ? Continue oral sodium chloride tabs 1 g 3 times daily ? Will order 1L NS @100ml /hr for hydration and sodium correction. Desired sodium goal greater than 130 ? ?Hypotension: possibly hypovolemic. Holding home lisinopril and atenolol.  ? ? ? LOS:  3 ?[place] ?3/3/20232:01 PM ?  ?

## 2021-10-11 NOTE — Care Management Important Message (Signed)
Important Message ? ?Patient Details  ?Name: Carl [name] ?MRN: [id number] ?Date of Birth: [birth year] ? ? ?Medicare Important Message Given:  N/A - LOS <3 / Initial given by admissions ? ? ? ? ?Olegario Messier A Nitza Schmid ?10/11/2021, 8:58 AM ?

## 2021-10-11 NOTE — Progress Notes (Signed)
?Progress Note ? ? ?Patient: Carl [name] ZHG:[id number] DOB: [birth year] DOA: 10/08/2021     3 ?DOS: the patient was seen and examined on 10/11/2021 ?  ?Brief hospital course: ?Mr. Korol is a 71 yo male with PMH as Asperger's syndrome, A-fib, HTN, recurrent hyponatremia who again presented with symptomatic hyponatremia.  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 ?Recent hospitalizations include 04/2021, 06/2021, and 07/2021.  He is typically placed on fluid restriction which improves sodium levels.  ?During this hospitalization he again presented with leg cramps and excessive water intake.  Sodium on admission found to be 109. ?He was placed on hypertonic saline and nephrology was also consulted on admission. ?He had rapid correction and sodium level continued to be monitored which ultimately stabilized. ? ?Assessment and Plan: ?* Hyponatremia ?- Recurrent severe hyponatremia, 109 on admission ?- Treated with 3% saline with rapid correction which was then discontinued ?-Sodium levels relatively stabilizing but some downtrend since yesterday.  Nephrology starting fluids today ?-Salt tablets also started per nephrology ?-Continue oral fluid restriction ?- Continue trending sodium levels ? ?Hypotension ?- Lisinopril and atenolol on hold ? ?Asperger's syndrome ?- patient live's alone; given recurrent hospitalizations for similar hyponatremia, will see if any avail resources for patient at home  ? ?Atrial fibrillation (HCC) ?- Atenolol on hold in setting of hypotension ?-No anticoagulation noted on home med rec ? ? ? ? ? ?Subjective: No events overnight.  Patient fixated over sodium level this morning.  Reviewed labs with him. ? ?Physical Exam: ?Vitals:  ? 10/10/21 2322 10/11/21 0324 10/11/21 0752 10/11/21 1603  ?BP: 132/78 (!) 143/78 (!) 150/67 132/72  ?Pulse: 72 72 81 75  ?Resp: 17 17 14 18   ?Temp: 98.4 ?F (36.9 ?C) 98.4 ?F (36.9 ?C) 98.2 ?F (36.8  ?C) 98.3 ?F (36.8 ?C)  ?TempSrc:   Oral   ?SpO2: 94% 100% 94%   ?Weight:      ?Height:      ? ?Physical Exam ?Constitutional:   ?   General: He is not in acute distress. ?   Appearance: Normal appearance. He is obese.  ?HENT:  ?   Head: Normocephalic and atraumatic.  ?   Mouth/Throat:  ?   Mouth: Mucous membranes are moist.  ?Eyes:  ?   Extraocular Movements: Extraocular movements intact.  ?Cardiovascular:  ?   Rate and Rhythm: Normal rate and regular rhythm.  ?   Heart sounds: Normal heart sounds.  ?Pulmonary:  ?   Effort: Pulmonary effort is normal. No respiratory distress.  ?   Breath sounds: Normal breath sounds. No wheezing.  ?Abdominal:  ?   General: Bowel sounds are normal. There is no distension.  ?   Palpations: Abdomen is soft.  ?   Tenderness: There is no abdominal tenderness.  ?Musculoskeletal:     ?   General: Normal range of motion.  ?   Cervical back: Normal range of motion and neck supple.  ?Skin: ?   General: Skin is warm and dry.  ?Neurological:  ?   General: No focal deficit present.  ?   Mental Status: He is alert.  ?Psychiatric:  ?   Comments: Mildly pressured speech  ? ? ? ?Data Reviewed: ?Results for orders placed or performed during the hospital encounter of 10/08/21 (from the past 24 hour(s))  ?Sodium     Status: Abnormal  ? Collection Time: 10/10/21  6:08 PM  ?Result Value Ref Range  ? Sodium 125 (L)  135 - 145 mmol/L  ?CBC     Status: None  ? Collection Time: 10/11/21  3:18 AM  ?Result Value Ref Range  ? WBC 6.3 4.0 - 10.5 K/uL  ? RBC 5.02 4.22 - 5.81 MIL/uL  ? Hemoglobin 13.7 13.0 - 17.0 g/dL  ? HCT 40.8 39.0 - 52.0 %  ? MCV 81.3 80.0 - 100.0 fL  ? MCH 27.3 26.0 - 34.0 pg  ? MCHC 33.6 30.0 - 36.0 g/dL  ? RDW 14.6 11.5 - 15.5 %  ? Platelets 220 150 - 400 K/uL  ? nRBC 0.0 0.0 - 0.2 %  ?Basic metabolic panel     Status: Abnormal  ? Collection Time: 10/11/21  3:18 AM  ?Result Value Ref Range  ? Sodium 125 (L) 135 - 145 mmol/L  ? Potassium 4.8 3.5 - 5.1 mmol/L  ? Chloride 93 (L) 98 - 111 mmol/L   ? CO2 26 22 - 32 mmol/L  ? Glucose, Bld 113 (H) 70 - 99 mg/dL  ? BUN 17 8 - 23 mg/dL  ? Creatinine, Ser 0.77 0.61 - 1.24 mg/dL  ? Calcium 8.8 (L) 8.9 - 10.3 mg/dL  ? GFR, Estimated >60 >60 mL/min  ? Anion gap 6 5 - 15  ?Magnesium     Status: None  ? Collection Time: 10/11/21  3:18 AM  ?Result Value Ref Range  ? Magnesium 2.1 1.7 - 2.4 mg/dL  ?Sodium     Status: Abnormal  ? Collection Time: 10/11/21  7:54 AM  ?Result Value Ref Range  ? Sodium 125 (L) 135 - 145 mmol/L  ?  ?I have Reviewed nursing notes, Vitals, and Lab results since pt's last encounter. Pertinent lab results : see above ?I have ordered test including BMP, CBC, Mg ?I have reviewed the last note from staff over past 24 hours ?I have discussed pt's care plan and test results with nursing staff, case manager ? ? ?Family Communication:  ? ?Disposition: ?Status is: Inpatient ?Remains inpatient appropriate because: Treatment as outlined in A&P ? ? ? Planned Discharge Destination: Home with Home Health ? ?Antimicrobials: ? ? ?Consultants: ?Nephrology ? ?Procedures:  ? ? ?DVT ppx:  ?Lovenox ? ? ?  Code Status: Full Code  ? ? ?Author: ?Lewie Chamber, MD ?10/11/2021 4:48 PM ? ?For on call review [email or web address].  ? ?

## 2021-10-12 LAB — BASIC METABOLIC PANEL
Anion gap: 10 (ref 5–15)
BUN: 16 mg/dL (ref 8–23)
CO2: 23 mmol/L (ref 22–32)
Calcium: 8.9 mg/dL (ref 8.9–10.3)
Chloride: 97 mmol/L — ABNORMAL LOW (ref 98–111)
Creatinine, Ser: 0.55 mg/dL — ABNORMAL LOW (ref 0.61–1.24)
GFR, Estimated: >60 mL/min (ref >60)
Glucose, Bld: 117 mg/dL — ABNORMAL HIGH (ref 70–99)
Potassium: 4.6 mmol/L (ref 3.5–5.1)
Sodium: 130 mmol/L — ABNORMAL LOW (ref 135–145)

## 2021-10-12 LAB — CBC
HCT: 42.3 % (ref 39.0–52.0)
Hemoglobin: 14.2 g/dL (ref 13.0–17.0)
MCH: 27.1 pg (ref 26.0–34.0)
MCHC: 33.6 g/dL (ref 30.0–36.0)
MCV: 80.7 fL (ref 80.0–100.0)
Platelets: 238 10*3/uL (ref 150–400)
RBC: 5.24 MIL/uL (ref 4.22–5.81)
RDW: 14.4 % (ref 11.5–15.5)
WBC: 5.4 10*3/uL (ref 4.0–10.5)
nRBC: 0 % (ref 0.0–0.2)

## 2021-10-12 LAB — SODIUM
Sodium: 130 mmol/L — ABNORMAL LOW (ref 135–145)
Sodium: 125 mmol/L — ABNORMAL LOW (ref 135–145)

## 2021-10-12 LAB — MAGNESIUM: Magnesium: 2.1 mg/dL (ref 1.7–2.4)

## 2021-10-12 MED ORDER — POLYETHYLENE GLYCOL 3350 17 G PO PACK
17.0000 g | PACK | Freq: Every day | ORAL | Status: DC
Start: 2021-10-12 — End: 2021-10-15
  Administered 2021-10-12 – 2021-10-15 (×4): 17 g via ORAL
  Filled 2021-10-12 (×4): qty 1

## 2021-10-12 MED ORDER — SENNOSIDES-DOCUSATE SODIUM 8.6-50 MG PO TABS
1.0000 | ORAL_TABLET | Freq: Two times a day (BID) | ORAL | Status: DC
  Administered 2021-10-12 – 2021-10-15 (×7): 1 via ORAL
  Filled 2021-10-12 (×7): qty 1

## 2021-10-12 MED ORDER — CARVEDILOL 3.125 MG PO TABS
6.2500 mg | ORAL_TABLET | Freq: Two times a day (BID) | ORAL | Status: DC
  Administered 2021-10-12 – 2021-10-15 (×7): 6.25 mg via ORAL
  Filled 2021-10-12 (×7): qty 2

## 2021-10-12 MED ORDER — LACTULOSE 10 GM/15ML PO SOLN
20.0000 g | Freq: Once | ORAL | Status: AC
  Administered 2021-10-12: 20 g via ORAL
  Filled 2021-10-12: qty 30

## 2021-10-12 NOTE — Progress Notes (Signed)
[facility]  ROUNDING NOTE   Subjective:   Carl [name] was admitted to [facility] on 10/08/2021 for Hyponatremia [E87.1]  Patient was drinkin a great deal of water to compensate for having constipation. Patient states that he was following a fluid restriction prior to this admission.   Patient was sitting up in the bed Patient offers no new specific physical complaints Patient's main comments in today visit was about the social issues   Objective:  Vital signs in last 24 hours:  Temp:  [97.8 F (36.6 C)-98.9 F (37.2 C)] 98.3 F (36.8 C) (03/04 1158) Pulse Rate:  [71-87] 71 (03/04 1158) Resp:  [12-20] 15 (03/04 1158) BP: (111-181)/(64-92) 111/64 (03/04 1158) SpO2:  [93 %-100 %] 94 % (03/04 1158) Weight:  [129.7 kg] 129.7 kg (03/04 0500)  Weight change:  Filed Weights   10/08/21 1930 10/09/21 0233 10/12/21 0500  Weight: 131.5 kg 125.2 kg 129.7 kg    Intake/Output: I/O last 3 completed shifts: In: 1265.9 [P.O.:1200; I.V.:65.9] Out: 1200 [Urine:1200]   Intake/Output this shift:  No intake/output data recorded.  Physical Exam: General: NAD  Head: Normocephalic, atraumatic. Moist oral mucosal membranes  Eyes: Anicteric  Lungs:  Clear to auscultation, normal effort  Heart: Regular rate and rhythm  Abdomen:  Soft, nontender  Extremities:  no peripheral edema.  Neurologic: Nonfocal, moving all four extremities  Skin: No lesions        Basic Metabolic Panel: Recent Labs  Lab 10/08/21 2104 10/09/21 0243 10/09/21 0455 10/09/21 0721 10/09/21 1116 10/10/21 0150 10/10/21 4098 10/11/21 0318 10/11/21 0754 10/11/21 1826 10/12/21 0433 10/12/21 0820  NA 114*   < > 123* 124*   < > 127*   < > 125* 125* 127* 130* 130*  K  --   --  4.5 4.9  --  4.8  --  4.8  --   --  4.6  --   CL  --   --  87* 87*  --  93*  --  93*  --   --  97*  --   CO2  --   --  29 31  --  28  --  26  --   --  23  --   GLUCOSE  --   --  155* 150*  --  92  --  113*  --   --   117*  --   BUN  --   --  9 9  --  13  --  17  --   --  16  --   CREATININE  --   --  0.58* 0.68  --  0.77  --  0.77  --   --  0.55*  --   CALCIUM  --   --  8.9 8.7*  --  8.6*  --  8.8*  --   --  8.9  --   MG 1.9  --   --   --   --   --   --  2.1  --   --  2.1  --    < > = values in this interval not displayed.    Liver Function Tests: No results for input(s): AST, ALT, ALKPHOS, BILITOT, PROT, ALBUMIN in the last 168 hours. No results for input(s): LIPASE, AMYLASE in the last 168 hours. No results for input(s): AMMONIA in the last 168 hours.  CBC: Recent Labs  Lab 10/08/21 1933 10/10/21 0150 10/11/21 0318 10/12/21 0433  WBC 5.0 4.8  6.3 5.4  HGB 13.8 14.0 13.7 14.2  HCT 40.1 42.1 40.8 42.3  MCV 79.1* 81.3 81.3 80.7  PLT 210 189 220 238    Cardiac Enzymes: No results for input(s): CKTOTAL, CKMB, CKMBINDEX, TROPONINI in the last 168 hours.  BNP: Invalid input(s): POCBNP  CBG: Recent Labs  Lab 10/09/21 0220  GLUCAP 151*    Microbiology: Results for orders placed or performed during the hospital encounter of 10/08/21  Resp Panel by RT-PCR (Flu A&B, Covid) Nasopharyngeal Swab     Status: None   Collection Time: 10/08/21 12:30 AM   Specimen: Nasopharyngeal Swab; Nasopharyngeal(NP) swabs in vial transport medium  Result Value Ref Range Status   SARS Coronavirus 2 by RT PCR NEGATIVE NEGATIVE Final    Comment: (NOTE) SARS-CoV-2 target nucleic acids are NOT DETECTED.  The SARS-CoV-2 RNA is generally detectable in upper respiratory specimens during the acute phase of infection. The lowest concentration of SARS-CoV-2 viral copies this assay can detect is 138 copies/mL. A negative result does not preclude SARS-Cov-2 infection and should not be used as the sole basis for treatment or other patient management decisions. A negative result may occur with  improper specimen collection/handling, submission of specimen other than nasopharyngeal swab, presence of viral mutation(s)  within the areas targeted by this assay, and inadequate number of viral copies(<138 copies/mL). A negative result must be combined with clinical observations, patient history, and epidemiological information. The expected result is Negative.  Fact Sheet for Patients:  BloggerCourse.com  Fact Sheet for Healthcare Providers:  SeriousBroker.it  This test is no t yet approved or cleared by the [place] FDA and  has been authorized for detection and/or diagnosis of SARS-CoV-2 by FDA under an Emergency Use Authorization (EUA). This EUA will remain  in effect (meaning this test can be used) for the duration of the COVID-19 declaration under Section 564(b)(1) of the Act, 21 U.S.C.section 360bbb-3(b)(1), unless the authorization is terminated  or revoked sooner.       Influenza A by PCR NEGATIVE NEGATIVE Final   Influenza B by PCR NEGATIVE NEGATIVE Final    Comment: (NOTE) The Xpert Xpress SARS-CoV-2/FLU/RSV plus assay is intended as an aid in the diagnosis of influenza from Nasopharyngeal swab specimens and should not be used as a sole basis for treatment. Nasal washings and aspirates are unacceptable for Xpert Xpress SARS-CoV-2/FLU/RSV testing.  Fact Sheet for Patients: BloggerCourse.com  Fact Sheet for Healthcare Providers: SeriousBroker.it  This test is not yet approved or cleared by the [place] FDA and has been authorized for detection and/or diagnosis of SARS-CoV-2 by FDA under an Emergency Use Authorization (EUA). This EUA will remain in effect (meaning this test can be used) for the duration of the COVID-19 declaration under Section 564(b)(1) of the Act, 21 U.S.C. section 360bbb-3(b)(1), unless the authorization is terminated or revoked.  Performed at [facility], [address] [place], [place] [address]   MRSA Next Gen by PCR, Nasal     Status: None    Collection Time: 10/09/21  3:38 AM   Specimen: Nasal Mucosa; Nasal Swab  Result Value Ref Range Status   MRSA by PCR Next Gen NOT DETECTED NOT DETECTED Final    Comment: (NOTE) The GeneXpert MRSA Assay (FDA approved for NASAL specimens only), is one component of a comprehensive MRSA colonization surveillance program. It is not intended to diagnose MRSA infection nor to guide or monitor treatment for MRSA infections. Test performance is not FDA approved in patients less than 2  years old. Performed at [facility], [address] [place], [place] [address]     Coagulation Studies: No results for input(s): LABPROT, INR in the last 72 hours.  Urinalysis: No results for input(s): COLORURINE, LABSPEC, PHURINE, GLUCOSEU, HGBUR, BILIRUBINUR, KETONESUR, PROTEINUR, UROBILINOGEN, NITRITE, LEUKOCYTESUR in the last 72 hours.  Invalid input(s): APPERANCEUR    Imaging: No results found.   Medications:      ascorbic acid  1,000 mg Oral Daily   budesonide (PULMICORT) nebulizer solution  0.25 mg Nebulization BID   carvedilol  6.25 mg Oral BID WC   Chlorhexidine Gluconate Cloth  6 each Topical Daily   enoxaparin (LOVENOX) injection  60 mg Subcutaneous Q24H   folic acid  1 mg Oral Daily   pantoprazole  40 mg Oral Daily   polyethylene glycol  17 g Oral Daily   senna-docusate  1 tablet Oral BID   sodium chloride  1 g Oral TID WC   albuterol, diazepam, docusate sodium  Assessment/ Plan:  Mr. Oras Splitt is a 71 y.o. white male Asperger's, asthma, atrial fibrillation, diverticulosis, GERD, hypertension, psoriasis who is admitted to [facility] on 10/08/2021 for Hyponatremia [E87.1]   Hyponatremia: secondary to polydipsia: urine specific gravity of 1.000, urine osm of 113.  Patient sodium level has improved from 109 to now 130 Patient sodium was 109 on February 28 and is now at 130 on March 4 Patient has had a change of 21 mEq in 4 days  Low threshold to administer  Dextrose infusion We will continue to encourage oral intake We will continue oral sodium chloride tabs 1 g 3 times daily  Desired sodium goal greater than 130  Hypertension:  Patient was earlier hypotensive and patient's home medications were held We will start patient on Coreg  3.Atrial fibrillation Patient was on beta-blockers Patient is currently on Coreg Primary team is following   LOS: 4 Emmelia Holdsworth s [facility] 3/4/20232:40 PM

## 2021-10-12 NOTE — Plan of Care (Signed)
Patient awake most of the night. No new changes in assessment. ? ? ? ?Problem: Education: ?Goal: Knowledge of General Education information will improve ?Description: Including pain rating scale, medication(s)/side effects and non-pharmacologic comfort measures ?Outcome: Progressing ?  ?Problem: Health Behavior/Discharge Planning: ?Goal: Ability to manage health-related needs will improve ?Outcome: Progressing ?  ?Problem: Clinical Measurements: ?Goal: Ability to maintain clinical measurements within normal limits will improve ?Outcome: Progressing ?Goal: Will remain free from infection ?Outcome: Progressing ?Goal: Diagnostic test results will improve ?Outcome: Progressing ?Goal: Respiratory complications will improve ?Outcome: Progressing ?Goal: Cardiovascular complication will be avoided ?Outcome: Progressing ?  ?Problem: Activity: ?Goal: Risk for activity intolerance will decrease ?Outcome: Progressing ?  ?Problem: Nutrition: ?Goal: Adequate nutrition will be maintained ?Outcome: Progressing ?  ?Problem: Coping: ?Goal: Level of anxiety will decrease ?Outcome: Progressing ?  ?Problem: Elimination: ?Goal: Will not experience complications related to bowel motility ?Outcome: Progressing ?Goal: Will not experience complications related to urinary retention ?Outcome: Progressing ?  ?Problem: Pain Managment: ?Goal: General experience of comfort will improve ?Outcome: Progressing ?  ?Problem: Safety: ?Goal: Ability to remain free from injury will improve ?Outcome: Progressing ?  ?Problem: Skin Integrity: ?Goal: Risk for impaired skin integrity will decrease ?Outcome: Progressing ?  ?

## 2021-10-12 NOTE — Progress Notes (Signed)
?Progress Note ? ? ?Patient: Carl [name] WGY:[id number] DOB: 07/18/1951 DOA: 10/08/2021     4 ?DOS: the patient was seen and examined on 10/12/2021 ?  ?Brief hospital course: ?Carl [name] is a 71 yo male with PMH as Asperger's syndrome, A-fib, HTN, recurrent hyponatremia who again presented with symptomatic hyponatremia.  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 ?Recent hospitalizations include 04/2021, 06/2021, and 07/2021.  He is typically placed on fluid restriction which improves sodium levels.  ?During this hospitalization he again presented with leg cramps and excessive water intake.  Sodium on admission found to be 109. ?He was placed on hypertonic saline and nephrology was also consulted on admission. ?He had rapid correction and sodium level continued to be monitored which ultimately stabilized. ? ?Assessment and Plan: ?* Hyponatremia ?- Recurrent severe hyponatremia, 109 on admission ?- Treated with 3% saline with rapid correction which was then discontinued ?-Sodium levels relatively stabilizing but some downtrend since yesterday.  Nephrology starting fluids today ?-Salt tablets also started per nephrology ?-Continue oral fluid restriction ?- Continue trending sodium levels ? ?Hypotension ?- Lisinopril and atenolol on hold ? ?Asperger's syndrome ?- patient live's alone; given recurrent hospitalizations for similar hyponatremia, will see if any avail resources for patient at home  ? ?Atrial fibrillation (HCC) ?- Atenolol on hold in setting of hypotension ?-No anticoagulation noted on home med rec ? ? ? ? ? ?Subjective: No events overnight. Resting in bed comfortable. He mentions he hasn't had a bowel movement in a few days and would like something to help him go.  ? ?Physical Exam: ?Vitals:  ? 10/12/21 0735 10/12/21 0740 10/12/21 1100 10/12/21 1158  ?BP:  (!) 160/76 (!) 149/77 111/64  ?Pulse:  87 77 71  ?Resp:  16 12 15   ?Temp:  98.9  ?F (37.2 ?C) 97.9 ?F (36.6 ?C) 98.3 ?F (36.8 ?C)  ?TempSrc:   Oral   ?SpO2: 93% 99%  94%  ?Weight:      ?Height:      ? ?Physical Exam ?Constitutional:   ?   General: He is not in acute distress. ?   Appearance: Normal appearance. He is obese.  ?HENT:  ?   Head: Normocephalic and atraumatic.  ?   Mouth/Throat:  ?   Mouth: Mucous membranes are moist.  ?Eyes:  ?   Extraocular Movements: Extraocular movements intact.  ?Cardiovascular:  ?   Rate and Rhythm: Normal rate and regular rhythm.  ?   Heart sounds: Normal heart sounds.  ?Pulmonary:  ?   Effort: Pulmonary effort is normal. No respiratory distress.  ?   Breath sounds: Normal breath sounds. No wheezing.  ?Abdominal:  ?   General: Bowel sounds are normal. There is no distension.  ?   Palpations: Abdomen is soft.  ?   Tenderness: There is no abdominal tenderness.  ?Musculoskeletal:     ?   General: Normal range of motion.  ?   Cervical back: Normal range of motion and neck supple.  ?Skin: ?   General: Skin is warm and dry.  ?Neurological:  ?   General: No focal deficit present.  ?   Mental Status: He is alert.  ?Psychiatric:  ?   Comments: Mildly pressured speech  ? ? ? ?Data Reviewed: ?Results for orders placed or performed during the hospital encounter of 10/08/21 (from the past 24 hour(s))  ?Sodium     Status: Abnormal  ? Collection Time: 10/11/21  6:26 PM  ?  Result Value Ref Range  ? Sodium 127 (L) 135 - 145 mmol/L  ?CBC     Status: None  ? Collection Time: 10/12/21  4:33 AM  ?Result Value Ref Range  ? WBC 5.4 4.0 - 10.5 K/uL  ? RBC 5.24 4.22 - 5.81 MIL/uL  ? Hemoglobin 14.2 13.0 - 17.0 g/dL  ? HCT 42.3 39.0 - 52.0 %  ? MCV 80.7 80.0 - 100.0 fL  ? MCH 27.1 26.0 - 34.0 pg  ? MCHC 33.6 30.0 - 36.0 g/dL  ? RDW 14.4 11.5 - 15.5 %  ? Platelets 238 150 - 400 K/uL  ? nRBC 0.0 0.0 - 0.2 %  ?Basic metabolic panel     Status: Abnormal  ? Collection Time: 10/12/21  4:33 AM  ?Result Value Ref Range  ? Sodium 130 (L) 135 - 145 mmol/L  ? Potassium 4.6 3.5 - 5.1 mmol/L  ?  Chloride 97 (L) 98 - 111 mmol/L  ? CO2 23 22 - 32 mmol/L  ? Glucose, Bld 117 (H) 70 - 99 mg/dL  ? BUN 16 8 - 23 mg/dL  ? Creatinine, Ser 0.55 (L) 0.61 - 1.24 mg/dL  ? Calcium 8.9 8.9 - 10.3 mg/dL  ? GFR, Estimated >60 >60 mL/min  ? Anion gap 10 5 - 15  ?Magnesium     Status: None  ? Collection Time: 10/12/21  4:33 AM  ?Result Value Ref Range  ? Magnesium 2.1 1.7 - 2.4 mg/dL  ?Sodium     Status: Abnormal  ? Collection Time: 10/12/21  8:20 AM  ?Result Value Ref Range  ? Sodium 130 (L) 135 - 145 mmol/L  ?  ?I have Reviewed nursing notes, Vitals, and Lab results since pt's last encounter. Pertinent lab results : see above ?I have ordered test including BMP, CBC, Mg ?I have reviewed the last note from staff over past 24 hours ?I have discussed pt's care plan and test results with nursing staff, case manager ? ? ?Family Communication:  ? ?Disposition: ?Status is: Inpatient ?Remains inpatient appropriate because: Treatment as outlined in A&P ? ? ? Planned Discharge Destination: Home with Home Health ? ?Antimicrobials: ? ? ?Consultants: ?Nephrology ? ?Procedures:  ? ? ?DVT ppx:  ?Lovenox ? ? ?  Code Status: Full Code  ? ? ?Author: ?Carl Chamber, MD ?10/12/2021 1:58 PM ? ?For on call review [email or web address]  ? ?

## 2021-10-13 LAB — SODIUM
Sodium: 128 mmol/L — ABNORMAL LOW (ref 135–145)
Sodium: 130 mmol/L — ABNORMAL LOW (ref 135–145)

## 2021-10-13 LAB — MAGNESIUM: Magnesium: 1.9 mg/dL (ref 1.7–2.4)

## 2021-10-13 MED ORDER — SODIUM CHLORIDE 1 G PO TABS
2.0000 g | ORAL_TABLET | Freq: Two times a day (BID) | ORAL | Status: DC
  Administered 2021-10-13 – 2021-10-15 (×4): 2 g via ORAL
  Filled 2021-10-13 (×4): qty 2

## 2021-10-13 NOTE — Progress Notes (Signed)
Physical Therapy Treatment ?Patient Details ?Name: Carl [name] ?MRN: [id number] ?DOB: 13-May-1951 ?Today's Date: 10/13/2021 ? ? ?History of Present Illness Patient is a 71 year old male who reported to [facility] on 2/28 with c/o of bilateral leg cramps and weakness. 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 ? ?  ?PT Comments  ? ? Pt seen for PT tx with pt agreeable to tx. Pt has reduced cognitive flexibility & requires extra time to transition from ambulating linear paths vs turns; pt changes gait speed as he ambulates more quickly in straight hallways & decreased gait speed with turns & in his room. Pt negotiates 3 steps with B rails with mod I to simulate home environment. At this time, pt is independent with mobility & does not demonstrate any acute PT needs. PT to sign off at this time. Pt can continue to ambulate with nursing staff while in acute setting. ?   ?Recommendations for follow up therapy are one component of a multi-disciplinary discharge planning process, led by the attending physician.  Recommendations may be updated based on patient status, additional functional criteria and insurance authorization. ? ?Follow Up Recommendations ? No PT follow up ?  ?  ?Assistance Recommended at Discharge PRN  ?Patient can return home with the following A little help with bathing/dressing/bathroom;A little help with walking and/or transfers ?  ?Equipment Recommendations ? None recommended by PT  ?  ?Recommendations for Other Services   ? ? ?  ?Precautions / Restrictions Precautions ?Precautions: None ?Restrictions ?Weight Bearing Restrictions: No  ?  ? ?Mobility ? Bed Mobility ?  ?  ?  ?  ?  ?  ?  ?General bed mobility comments: not observed, pt received & left sitting in recliner ?  ? ?Transfers ?Overall transfer level: Needs assistance ?Equipment used: None ?Transfers: Sit to/from Stand ?Sit to Stand: Independent ?  ?  ?  ?  ?  ?  ?  ? ?Ambulation/Gait ?Ambulation/Gait  assistance: Independent ?Gait Distance (Feet): 600 Feet ?Assistive device: None ?Gait Pattern/deviations: Decreased step length - right, Decreased step length - left, Decreased stride length, Wide base of support ?  ?  ?  ?General Gait Details: Pt with difficulty changing gait speed (mainly 2/2 reduced cognitive flexibility), does increase gait speed on "straigthaways" & decreased gait speed with turns ? ? ?Stairs ?Stairs: Yes ?Stairs assistance: Modified independent (Device/Increase time) ?Stair Management: Two rails, Alternating pattern ?Number of Stairs: 3 ?General stair comments: mod I for stair negotiation except supervision when turning just for safety ? ? ?Wheelchair Mobility ?  ? ?Modified Rankin (Stroke Patients Only) ?  ? ? ?  ?Balance Overall balance assessment: Needs assistance ?Sitting-balance support: Feet supported, No upper extremity supported ?Sitting balance-Leahy Scale: Normal ?  ?  ?Standing balance support: No upper extremity supported, During functional activity ?Standing balance-Leahy Scale: Good ?  ?  ?  ?  ?  ?  ?  ?  ?  ?  ?  ?  ?  ? ?  ?Cognition Arousal/Alertness: Awake/alert ?Behavior During Therapy: [facility] for tasks assessed/performed ?Overall Cognitive Status: Within Functional Limits for tasks assessed ?  ?  ?  ?  ?  ?  ?  ?  ?  ?  ?  ?  ?  ?  ?  ?  ?General Comments: hx of asperger's; difficulty changing cognitively tasks quickly ?  ?  ? ?  ?Exercises   ? ?  ?General Comments General comments (skin integrity,  edema, etc.): HR in the 90s during session ?  ?  ? ?Pertinent Vitals/Pain Pain Assessment ?Pain Assessment: No/denies pain  ? ? ?Home Living   ?  ?  ?  ?  ?  ?  ?  ?  ?  ?   ?  ?Prior Function    ?  ?  ?   ? ?PT Goals (current goals can now be found in the care plan section) Acute Rehab PT Goals ?Patient Stated Goal: to get better "I should be getting up and walking at least 3x/day so I dont leave here worse than I came" ?PT Goal Formulation: With patient ?Time For Goal  Achievement: 10/24/21 ?Potential to Achieve Goals: Good ?Progress towards PT goals: Goals met/education completed, patient discharged from PT ? ?  ?Frequency ? ? ?   ? ? ? ?  ?PT Plan Discharge plan needs to be updated  ? ? ?Co-evaluation   ?  ?  ?  ?  ? ?  ?AM-PAC PT "6 Clicks" Mobility   ?Outcome Measure ? Help needed turning from your back to your side while in a flat bed without using bedrails?: None ?Help needed moving from lying on your back to sitting on the side of a flat bed without using bedrails?: None ?Help needed moving to and from a bed to a chair (including a wheelchair)?: None ?Help needed standing up from a chair using your arms (e.g., wheelchair or bedside chair)?: None ?Help needed to walk in hospital room?: None ?Help needed climbing 3-5 steps with a railing? : None ?6 Click Score: 24 ? ?  ?End of Session   ?Activity Tolerance: Patient tolerated treatment well ?Patient left: in chair;with chair alarm set;with call bell/phone within reach ?Nurse Communication: Mobility status ?  ?  ? ? ?Time: 6389-3734 ?PT Time Calculation (min) (ACUTE ONLY): 13 min ? ?Charges:  $Therapeutic Activity: 8-22 mins          ?          ? ?Lavone Nian, PT, DPT ?10/13/21, 12:38 PM ? ? ? ?Waunita Schooner ?10/13/2021, 12:36 PM ? ?

## 2021-10-13 NOTE — Progress Notes (Signed)
?Progress Note ? ? ?Patient: Carl [name] YWV:[id number] DOB: 08/29/1950 DOA: 10/08/2021     5 ?DOS: the patient was seen and examined on 10/13/2021 ?  ?Brief hospital course: ?[place] [name] is a 71 yo male with PMH as Asperger's syndrome, A-fib, HTN, recurrent hyponatremia who again presented with symptomatic hyponatremia.  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 ?Recent hospitalizations include 04/2021, 06/2021, and 07/2021.  He is typically placed on fluid restriction which improves sodium levels.  ?During this hospitalization he again presented with leg cramps and excessive water intake.  Sodium on admission found to be 109. ?He was placed on hypertonic saline and nephrology was also consulted on admission. ?He had rapid correction and sodium level continued to be monitored which ultimately stabilized. ? ?Assessment and Plan: ?* Hyponatremia ?- Recurrent severe hyponatremia, 109 on admission ?- Treated with 3% saline with rapid correction which was then discontinued ?-Salt tablets also started per nephrology ?-Continue oral fluid restriction ?- Continue trending sodium levels ?- goal Na>130 per nephrology before stable for d/c ? ?Hypotension ?- Lisinopril and atenolol on hold ? ?Asperger's syndrome ?- patient live's alone; given recurrent hospitalizations for similar hyponatremia, will see if any avail resources for patient at home  ? ?Atrial fibrillation (HCC) ?- Atenolol on hold in setting of hypotension ?-No anticoagulation noted on home med rec ? ? ? ? ? ?Subjective: No events overnight.  Sitting up in recliner this morning.  Sodium up to 128 this morning.  Tentative goal is for it to be greater than 130 before being considered stable for discharging home. ? ?Physical Exam: ?Vitals:  ? 10/12/21 2043 10/12/21 2215 10/13/21 0726 10/13/21 8546  ?BP:  137/73  135/76  ?Pulse:  74  70  ?Resp:    18  ?Temp:  (!) 97.5 ?F (36.4 ?C)  97.8 ?F (36.6  ?C)  ?TempSrc:    Oral  ?SpO2: 97% 100% 98% 95%  ?Weight:      ?Height:      ? ?Physical Exam ?Constitutional:   ?   General: He is not in acute distress. ?   Appearance: Normal appearance. He is obese.  ?HENT:  ?   Head: Normocephalic and atraumatic.  ?   Mouth/Throat:  ?   Mouth: Mucous membranes are moist.  ?Eyes:  ?   Extraocular Movements: Extraocular movements intact.  ?Cardiovascular:  ?   Rate and Rhythm: Normal rate and regular rhythm.  ?   Heart sounds: Normal heart sounds.  ?Pulmonary:  ?   Effort: Pulmonary effort is normal. No respiratory distress.  ?   Breath sounds: Normal breath sounds. No wheezing.  ?Abdominal:  ?   General: Bowel sounds are normal. There is no distension.  ?   Palpations: Abdomen is soft.  ?   Tenderness: There is no abdominal tenderness.  ?Musculoskeletal:     ?   General: Normal range of motion.  ?   Cervical back: Normal range of motion and neck supple.  ?Skin: ?   General: Skin is warm and dry.  ?Neurological:  ?   General: No focal deficit present.  ?   Mental Status: He is alert.  ?Psychiatric:  ?   Comments: Mildly pressured speech  ? ? ?Data Reviewed: ?Results for orders placed or performed during the hospital encounter of 10/08/21 (from the past 24 hour(s))  ?Sodium     Status: Abnormal  ? Collection Time: 10/12/21  6:05 PM  ?Result Value Ref  Range  ? Sodium 125 (L) 135 - 145 mmol/L  ?Magnesium     Status: None  ? Collection Time: 10/12/21  6:05 PM  ?Result Value Ref Range  ? Magnesium 1.9 1.7 - 2.4 mg/dL  ?Sodium     Status: Abnormal  ? Collection Time: 10/13/21  7:50 AM  ?Result Value Ref Range  ? Sodium 128 (L) 135 - 145 mmol/L  ?  ?I have Reviewed nursing notes, Vitals, and Lab results since pt's last encounter. Pertinent lab results : see above ?I have ordered test including BMP, CBC, Mg ?I have reviewed the last note from staff over past 24 hours ?I have discussed pt's care plan and test results with nursing staff, case manager ? ? ?Family Communication:   ? ?Disposition: ?Status is: Inpatient ?Remains inpatient appropriate because: Treatment as outlined in A&P ? ? ? Planned Discharge Destination: Home with Home Health ? ?Antimicrobials: ? ? ?Consultants: ?Nephrology ? ?Procedures:  ? ? ?DVT ppx:  ?Lovenox ? ? ?  Code Status: Full Code  ? ? ?Author: ?[place] Chamber, MD ?10/13/2021 12:40 PM ? ?For on call review [email or web address].  ? ?

## 2021-10-13 NOTE — Progress Notes (Signed)
[facility]  ROUNDING NOTE   Subjective:   [name] was admitted to [facility] on 10/08/2021 for Hyponatremia [E87.1]  Patient was drinkin a great deal of water to compensate for having constipation. Patient states that he was following a fluid restriction prior to this admission.   Patient seen sitting up in chair, alert and oriented Tolerating meals without nausea and vomiting  Sodium 125   Objective:  Vital signs in last 24 hours:  Temp:  [97.5 F (36.4 C)-98.3 F (36.8 C)] 97.8 F (36.6 C) (03/05 0808) Pulse Rate:  [70-81] 70 (03/05 0808) Resp:  [15-20] 18 (03/05 0808) BP: (111-156)/(64-76) 135/76 (03/05 0808) SpO2:  [94 %-100 %] 95 % (03/05 0808)  Weight change:  Filed Weights   10/08/21 1930 10/09/21 0233 10/12/21 0500  Weight: 131.5 kg 125.2 kg 129.7 kg    Intake/Output: I/O last 3 completed shifts: In: 240 [P.O.:240] Out: 400 [Urine:400]   Intake/Output this shift:  Total I/O In: -  Out: 2 [Urine:1; Stool:1]  Physical Exam: General: NAD  Head: Normocephalic, atraumatic. Moist oral mucosal membranes  Eyes: Anicteric  Lungs:  Clear to auscultation, normal effort  Heart: Regular rate and rhythm  Abdomen:  Soft, nontender  Extremities:  no peripheral edema.  Neurologic: Nonfocal, moving all four extremities  Skin: No lesions        Basic Metabolic Panel: Recent Labs  Lab 10/08/21 2104 10/09/21 0243 10/09/21 0455 10/09/21 0721 10/09/21 1116 10/10/21 0150 10/10/21 2947 10/11/21 0318 10/11/21 0754 10/11/21 1826 10/12/21 0433 10/12/21 0820 10/12/21 1805 10/13/21 0750  NA 114*   < > 123* 124*   < > 127*   < > 125*   < > 127* 130* 130* 125* 128*  K  --   --  4.5 4.9  --  4.8  --  4.8  --   --  4.6  --   --   --   CL  --   --  87* 87*  --  93*  --  93*  --   --  97*  --   --   --   CO2  --   --  29 31  --  28  --  26  --   --  23  --   --   --   GLUCOSE  --   --  155* 150*  --  92  --  113*  --   --  117*  --   --   --    BUN  --   --  9 9  --  13  --  17  --   --  16  --   --   --   CREATININE  --   --  0.58* 0.68  --  0.77  --  0.77  --   --  0.55*  --   --   --   CALCIUM  --   --  8.9 8.7*  --  8.6*  --  8.8*  --   --  8.9  --   --   --   MG 1.9  --   --   --   --   --   --  2.1  --   --  2.1  --  1.9  --    < > = values in this interval not displayed.     Liver Function Tests: No results for input(s): AST, ALT, ALKPHOS, BILITOT, PROT,  ALBUMIN in the last 168 hours. No results for input(s): LIPASE, AMYLASE in the last 168 hours. No results for input(s): AMMONIA in the last 168 hours.  CBC: Recent Labs  Lab 10/08/21 1933 10/10/21 0150 10/11/21 0318 10/12/21 0433  WBC 5.0 4.8 6.3 5.4  HGB 13.8 14.0 13.7 14.2  HCT 40.1 42.1 40.8 42.3  MCV 79.1* 81.3 81.3 80.7  PLT 210 189 220 238     Cardiac Enzymes: No results for input(s): CKTOTAL, CKMB, CKMBINDEX, TROPONINI in the last 168 hours.  BNP: Invalid input(s): POCBNP  CBG: Recent Labs  Lab 10/09/21 0220  GLUCAP 151*     Microbiology: Results for orders placed or performed during the hospital encounter of 10/08/21  Resp Panel by RT-PCR (Flu A&B, Covid) Nasopharyngeal Swab     Status: None   Collection Time: 10/08/21 12:30 AM   Specimen: Nasopharyngeal Swab; Nasopharyngeal(NP) swabs in vial transport medium  Result Value Ref Range Status   SARS Coronavirus 2 by RT PCR NEGATIVE NEGATIVE Final    Comment: (NOTE) SARS-CoV-2 target nucleic acids are NOT DETECTED.  The SARS-CoV-2 RNA is generally detectable in upper respiratory specimens during the acute phase of infection. The lowest concentration of SARS-CoV-2 viral copies this assay can detect is 138 copies/mL. A negative result does not preclude SARS-Cov-2 infection and should not be used as the sole basis for treatment or other patient management decisions. A negative result may occur with  improper specimen collection/handling, submission of specimen other than nasopharyngeal  swab, presence of viral mutation(s) within the areas targeted by this assay, and inadequate number of viral copies(<138 copies/mL). A negative result must be combined with clinical observations, patient history, and epidemiological information. The expected result is Negative.  Fact Sheet for Patients:  BloggerCourse.com  Fact Sheet for Healthcare Providers:  SeriousBroker.it  This test is no t yet approved or cleared by the [place] FDA and  has been authorized for detection and/or diagnosis of SARS-CoV-2 by FDA under an Emergency Use Authorization (EUA). This EUA will remain  in effect (meaning this test can be used) for the duration of the COVID-19 declaration under Section 564(b)(1) of the Act, 21 U.S.C.section 360bbb-3(b)(1), unless the authorization is terminated  or revoked sooner.       Influenza A by PCR NEGATIVE NEGATIVE Final   Influenza B by PCR NEGATIVE NEGATIVE Final    Comment: (NOTE) The Xpert Xpress SARS-CoV-2/FLU/RSV plus assay is intended as an aid in the diagnosis of influenza from Nasopharyngeal swab specimens and should not be used as a sole basis for treatment. Nasal washings and aspirates are unacceptable for Xpert Xpress SARS-CoV-2/FLU/RSV testing.  Fact Sheet for Patients: BloggerCourse.com  Fact Sheet for Healthcare Providers: SeriousBroker.it  This test is not yet approved or cleared by the [place] FDA and has been authorized for detection and/or diagnosis of SARS-CoV-2 by FDA under an Emergency Use Authorization (EUA). This EUA will remain in effect (meaning this test can be used) for the duration of the COVID-19 declaration under Section 564(b)(1) of the Act, 21 U.S.C. section 360bbb-3(b)(1), unless the authorization is terminated or revoked.  Performed at [facility], [address] [place], [place] [address]   MRSA  Next Gen by PCR, Nasal     Status: None   Collection Time: 10/09/21  3:38 AM   Specimen: Nasal Mucosa; Nasal Swab  Result Value Ref Range Status   MRSA by PCR Next Gen NOT DETECTED NOT DETECTED Final    Comment: (NOTE) The  GeneXpert MRSA Assay (FDA approved for NASAL specimens only), is one component of a comprehensive MRSA colonization surveillance program. It is not intended to diagnose MRSA infection nor to guide or monitor treatment for MRSA infections. Test performance is not FDA approved in patients less than 56 years old. Performed at [facility], [address] [place], [place] [address]     Coagulation Studies: No results for input(s): LABPROT, INR in the last 72 hours.  Urinalysis: No results for input(s): COLORURINE, LABSPEC, PHURINE, GLUCOSEU, HGBUR, BILIRUBINUR, KETONESUR, PROTEINUR, UROBILINOGEN, NITRITE, LEUKOCYTESUR in the last 72 hours.  Invalid input(s): APPERANCEUR    Imaging: No results found.   Medications:      ascorbic acid  1,000 mg Oral Daily   budesonide (PULMICORT) nebulizer solution  0.25 mg Nebulization BID   carvedilol  6.25 mg Oral BID WC   Chlorhexidine Gluconate Cloth  6 each Topical Daily   enoxaparin (LOVENOX) injection  60 mg Subcutaneous Q24H   folic acid  1 mg Oral Daily   pantoprazole  40 mg Oral Daily   polyethylene glycol  17 g Oral Daily   senna-docusate  1 tablet Oral BID   sodium chloride  1 g Oral TID WC   albuterol, diazepam, docusate sodium  Assessment/ Plan:  [name] is a 71 y.o. white male Asperger's, asthma, atrial fibrillation, diverticulosis, GERD, hypertension, psoriasis who is admitted to [facility] on 10/08/2021 for Hyponatremia [E87.1]   Hyponatremia: secondary to polydipsia: urine specific gravity of 1.000, urine osm of 113.  Patient sodium level has improved from 109 on February 28 to now 130 on March 4. Patient has had a change of 21 mEq in 4 days  Low threshold to administer Dextrose  infusion  Slight decrease with sodium to 125 today.  Will adjust sodium chloride tablets to 2 g twice daily.  Continue to encourage patient's oral intake.  We will continue to monitor.  Goal sodium greater than 130.  Hypertension:  Patient was earlier hypotensive and patient's home medications were held Carvedilol 6.25 mg twice daily  3.Atrial fibrillation Patient was on beta-blockers Patient is currently on Coreg Primary team is following   LOS: 5 Ramses Klecka 3/5/202311:02 AM

## 2021-10-14 DIAGNOSIS — I4891 Unspecified atrial fibrillation: Secondary | ICD-10-CM

## 2021-10-14 LAB — SODIUM
Sodium: 128 mmol/L — ABNORMAL LOW (ref 135–145)
Sodium: 130 mmol/L — ABNORMAL LOW (ref 135–145)

## 2021-10-14 LAB — MAGNESIUM: Magnesium: 2.2 mg/dL (ref 1.7–2.4)

## 2021-10-14 NOTE — Progress Notes (Signed)
?Progress Note ? ? ?Patient: [place] [name] [id number] DOB: 01/15/1951 DOA: 10/08/2021     6 ?DOS: the patient was seen and examined on 10/14/2021 ?  ?Brief hospital course: ?Carl [name] is a 71 yo male with PMH as Asperger's syndrome, A-fib, HTN, recurrent hyponatremia who again presented with symptomatic hyponatremia.  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 ?Recent hospitalizations include 04/2021, 06/2021, and 07/2021.  He is typically placed on fluid restriction which improves sodium levels.  ?During this hospitalization he again presented with leg cramps and excessive water intake.  Sodium on admission found to be 109. ?He was placed on hypertonic saline and nephrology was also consulted on admission. ?He had rapid correction and sodium level continued to be monitored which ultimately stabilized. ? ?Assessment and Plan: ?* Hyponatremia ?- Recurrent severe hyponatremia, 109 on admission ?- Treated with 3% saline with rapid correction which was then discontinued ?-Salt tablets also started per nephrology ?-Continue oral fluid restriction ?- Continue trending sodium levels ?- goal Na>130 per nephrology before stable for d/c ? ?Asperger's syndrome ?- patient live's alone; no extra resources avail at home, but noted patient continues to have recurrent hospitalizations for recurrent hyponatremia ? ?Atrial fibrillation ([place]) ?- Atenolol on hold in setting of hypotension ?-No anticoagulation noted on home med rec ? ?Hypotension-resolved as of 10/14/2021 ?- Lisinopril and atenolol on hold ?- on coreg as BP has improved ? ? ? ? ? ?Subjective: No events overnight.  Sitting up in chair in usual manner.  Appetite remains appropriate.  Had no concerns or complaints this morning. ? ?Physical Exam: ?Vitals:  ? 10/13/21 2017 10/14/21 0534 10/14/21 0535 10/14/21 0859  ?BP:  (!) 148/71  (!) 160/84  ?Pulse:  67  82  ?Resp:  18    ?Temp:   98.1 ?F (36.7 ?C)  98.7 ?F (37.1 ?C)  ?TempSrc:   Oral   ?SpO2: 97% 99%  100%  ?Weight:      ?Height:      ? ?Physical Exam ?Constitutional:   ?   General: He is not in acute distress. ?   Appearance: Normal appearance. He is obese.  ?   Comments: Pleasant and cooperative  ?HENT:  ?   Head: Normocephalic and atraumatic.  ?   Mouth/Throat:  ?   Mouth: Mucous membranes are moist.  ?Eyes:  ?   Extraocular Movements: Extraocular movements intact.  ?Cardiovascular:  ?   Rate and Rhythm: Normal rate and regular rhythm.  ?   Heart sounds: Normal heart sounds.  ?Pulmonary:  ?   Effort: Pulmonary effort is normal. No respiratory distress.  ?   Breath sounds: Normal breath sounds. No wheezing.  ?Abdominal:  ?   General: Bowel sounds are normal. There is no distension.  ?   Palpations: Abdomen is soft.  ?   Tenderness: There is no abdominal tenderness.  ?Musculoskeletal:     ?   General: Normal range of motion.  ?   Cervical back: Normal range of motion and neck supple.  ?Skin: ?   General: Skin is warm and dry.  ?   Comments: Chronic hyperpigmented skin changes noted in bilateral lower extremities  ?Neurological:  ?   General: No focal deficit present.  ?   Mental Status: He is alert.  ?Psychiatric:  ?   Comments: Mildly pressured speech  ? ? ?Data Reviewed: ?Results for orders placed or performed during the hospital encounter of 10/08/21 (from the past 24  hour(s))  ?Sodium     Status: Abnormal  ? Collection Time: 10/13/21  5:51 PM  ?Result Value Ref Range  ? Sodium 130 (L) 135 - 145 mmol/L  ?Magnesium     Status: None  ? Collection Time: 10/14/21  9:24 AM  ?Result Value Ref Range  ? Magnesium 2.2 1.7 - 2.4 mg/dL  ?Sodium     Status: Abnormal  ? Collection Time: 10/14/21  9:24 AM  ?Result Value Ref Range  ? Sodium 128 (L) 135 - 145 mmol/L  ?  ?I have Reviewed nursing notes, Vitals, and Lab results since pt's last encounter. Pertinent lab results : see above ?I have ordered test including: Na, Mg ?I have reviewed the last note from staff over  past 24 hours ?I have discussed pt's care plan and test results with nursing staff, case manager ? ? ?Family Communication:  ? ?Disposition: ?Status is: Inpatient ?Remains inpatient appropriate because: Treatment as outlined in A&P ? ? ? Planned Discharge Destination: Home with Home Health. Possibly Tues if Na stable ? ?Antimicrobials: ? ? ?Consultants: ?Nephrology ? ?Procedures:  ? ? ?DVT ppx:  ?Lovenox ? ? ?  Code Status: Full Code  ? ? ?Author: ?Dwyane Dee, MD ?10/14/2021 11:55 AM ? ?For on call review [email or web address].  ? ?

## 2021-10-14 NOTE — Progress Notes (Signed)
Mobility Specialist - Progress Note ? ? 10/14/21 1600  ?Mobility  ?Activity Ambulated independently in hallway  ?Level of Assistance Modified independent, requires aide device or extra time  ?Assistive Device None  ?Distance Ambulated (ft) 1200 ft  ?Activity Response Tolerated well  ?$Mobility charge 1 Mobility  ? ? ?Pt ambulated > 1200' in hallway modI, no LOB. No complaints. Slow and cautious with wide turns, but picks up gait speed on straight pathways. Pt returned to recliner with needs in reach.  ? ?[name] ?Mobility Specialist ?10/14/21, 4:24 PM ? ? ?

## 2021-10-14 NOTE — Progress Notes (Signed)
[facility]  ROUNDING NOTE   Subjective:   [name] was admitted to [facility] on 10/08/2021 for Hyponatremia [E87.1]  Patient was drinkin a great deal of water to compensate for having constipation. Patient states that he was following a fluid restriction prior to this admission.   Patient sitting up in chair Tolerating meals without nausea and vomiting No extremity edema improved  Sodium 130   Objective:  Vital signs in last 24 hours:  Temp:  [98.1 F (36.7 C)-98.7 F (37.1 C)] 98.7 F (37.1 C) (03/06 0859) Pulse Rate:  [67-82] 82 (03/06 0859) Resp:  [18] 18 (03/06 0534) BP: (141-160)/(71-84) 160/84 (03/06 0859) SpO2:  [97 %-100 %] 100 % (03/06 0859)  Weight change:  Filed Weights   10/08/21 1930 10/09/21 0233 10/12/21 0500  Weight: 131.5 kg 125.2 kg 129.7 kg    Intake/Output: I/O last 3 completed shifts: In: -  Out: 2 [Urine:1; Stool:1]   Intake/Output this shift:  Total I/O In: 480 [P.O.:480] Out: -   Physical Exam: General: NAD  Head: Normocephalic, atraumatic. Moist oral mucosal membranes  Eyes: Anicteric  Lungs:  Clear to auscultation, normal effort  Heart: Regular rate and rhythm  Abdomen:  Soft, nontender  Extremities:  no peripheral edema.  Neurologic: Nonfocal, moving all four extremities  Skin: No lesions        Basic Metabolic Panel: Recent Labs  Lab 10/08/21 2104 10/09/21 0243 10/09/21 0455 10/09/21 0721 10/09/21 1116 10/10/21 0150 10/10/21 7782 10/11/21 0318 10/11/21 0754 10/12/21 0433 10/12/21 0820 10/12/21 1805 10/13/21 0750 10/13/21 1751 10/14/21 0924  NA 114*   < > 123* 124*   < > 127*   < > 125*   < > 130* 130* 125* 128* 130* 128*  K  --   --  4.5 4.9  --  4.8  --  4.8  --  4.6  --   --   --   --   --   CL  --   --  87* 87*  --  93*  --  93*  --  97*  --   --   --   --   --   CO2  --   --  29 31  --  28  --  26  --  23  --   --   --   --   --   GLUCOSE  --   --  155* 150*  --  92  --  113*  --   117*  --   --   --   --   --   BUN  --   --  9 9  --  13  --  17  --  16  --   --   --   --   --   CREATININE  --   --  0.58* 0.68  --  0.77  --  0.77  --  0.55*  --   --   --   --   --   CALCIUM  --   --  8.9 8.7*  --  8.6*  --  8.8*  --  8.9  --   --   --   --   --   MG 1.9  --   --   --   --   --   --  2.1  --  2.1  --  1.9  --   --  2.2   < > =  values in this interval not displayed.     Liver Function Tests: No results for input(s): AST, ALT, ALKPHOS, BILITOT, PROT, ALBUMIN in the last 168 hours. No results for input(s): LIPASE, AMYLASE in the last 168 hours. No results for input(s): AMMONIA in the last 168 hours.  CBC: Recent Labs  Lab 10/08/21 1933 10/10/21 0150 10/11/21 0318 10/12/21 0433  WBC 5.0 4.8 6.3 5.4  HGB 13.8 14.0 13.7 14.2  HCT 40.1 42.1 40.8 42.3  MCV 79.1* 81.3 81.3 80.7  PLT 210 189 220 238     Cardiac Enzymes: No results for input(s): CKTOTAL, CKMB, CKMBINDEX, TROPONINI in the last 168 hours.  BNP: Invalid input(s): POCBNP  CBG: Recent Labs  Lab 10/09/21 0220  GLUCAP 151*     Microbiology: Results for orders placed or performed during the hospital encounter of 10/08/21  Resp Panel by RT-PCR (Flu A&B, Covid) Nasopharyngeal Swab     Status: None   Collection Time: 10/08/21 12:30 AM   Specimen: Nasopharyngeal Swab; Nasopharyngeal(NP) swabs in vial transport medium  Result Value Ref Range Status   SARS Coronavirus 2 by RT PCR NEGATIVE NEGATIVE Final    Comment: (NOTE) SARS-CoV-2 target nucleic acids are NOT DETECTED.  The SARS-CoV-2 RNA is generally detectable in upper respiratory specimens during the acute phase of infection. The lowest concentration of SARS-CoV-2 viral copies this assay can detect is 138 copies/mL. A negative result does not preclude SARS-Cov-2 infection and should not be used as the sole basis for treatment or other patient management decisions. A negative result may occur with  improper specimen collection/handling,  submission of specimen other than nasopharyngeal swab, presence of viral mutation(s) within the areas targeted by this assay, and inadequate number of viral copies(<138 copies/mL). A negative result must be combined with clinical observations, patient history, and epidemiological information. The expected result is Negative.  Fact Sheet for Patients:  BloggerCourse.com  Fact Sheet for Healthcare Providers:  SeriousBroker.it  This test is no t yet approved or cleared by the [place] FDA and  has been authorized for detection and/or diagnosis of SARS-CoV-2 by FDA under an Emergency Use Authorization (EUA). This EUA will remain  in effect (meaning this test can be used) for the duration of the COVID-19 declaration under Section 564(b)(1) of the Act, 21 U.S.C.section 360bbb-3(b)(1), unless the authorization is terminated  or revoked sooner.       Influenza A by PCR NEGATIVE NEGATIVE Final   Influenza B by PCR NEGATIVE NEGATIVE Final    Comment: (NOTE) The Xpert Xpress SARS-CoV-2/FLU/RSV plus assay is intended as an aid in the diagnosis of influenza from Nasopharyngeal swab specimens and should not be used as a sole basis for treatment. Nasal washings and aspirates are unacceptable for Xpert Xpress SARS-CoV-2/FLU/RSV testing.  Fact Sheet for Patients: BloggerCourse.com  Fact Sheet for Healthcare Providers: SeriousBroker.it  This test is not yet approved or cleared by the [place] FDA and has been authorized for detection and/or diagnosis of SARS-CoV-2 by FDA under an Emergency Use Authorization (EUA). This EUA will remain in effect (meaning this test can be used) for the duration of the COVID-19 declaration under Section 564(b)(1) of the Act, 21 U.S.C. section 360bbb-3(b)(1), unless the authorization is terminated or revoked.  Performed at [facility], [address] [place], [place] [address]   MRSA Next Gen by PCR, Nasal     Status: None   Collection Time: 10/09/21  3:38 AM   Specimen: Nasal Mucosa; Nasal Swab  Result  Value Ref Range Status   MRSA by PCR Next Gen NOT DETECTED NOT DETECTED Final    Comment: (NOTE) The GeneXpert MRSA Assay (FDA approved for NASAL specimens only), is one component of a comprehensive MRSA colonization surveillance program. It is not intended to diagnose MRSA infection nor to guide or monitor treatment for MRSA infections. Test performance is not FDA approved in patients less than 45 years old. Performed at [facility], [address] [place], [place] [address]     Coagulation Studies: No results for input(s): LABPROT, INR in the last 72 hours.  Urinalysis: No results for input(s): COLORURINE, LABSPEC, PHURINE, GLUCOSEU, HGBUR, BILIRUBINUR, KETONESUR, PROTEINUR, UROBILINOGEN, NITRITE, LEUKOCYTESUR in the last 72 hours.  Invalid input(s): APPERANCEUR    Imaging: No results found.   Medications:      ascorbic acid  1,000 mg Oral Daily   budesonide (PULMICORT) nebulizer solution  0.25 mg Nebulization BID   carvedilol  6.25 mg Oral BID WC   Chlorhexidine Gluconate Cloth  6 each Topical Daily   enoxaparin (LOVENOX) injection  60 mg Subcutaneous Q24H   folic acid  1 mg Oral Daily   pantoprazole  40 mg Oral Daily   polyethylene glycol  17 g Oral Daily   senna-docusate  1 tablet Oral BID   sodium chloride  2 g Oral BID WC   albuterol, diazepam, docusate sodium  Assessment/ Plan:  [name] is a 71 y.o. white male Asperger's, asthma, atrial fibrillation, diverticulosis, GERD, hypertension, psoriasis who is admitted to [facility] on 10/08/2021 for Hyponatremia [E87.1]   Hyponatremia: secondary to polydipsia: urine specific gravity of 1.000, urine osm of 113.  Patient sodium level has improved from 109 on February 28 to now 130 on March 4. Patient has had a change of 21 mEq  in 4 days  Low threshold to administer Dextrose infusion  Sodium at goal, 130.  Continue current treatment with sodium chloride tablets.  We will continue to monitor.   Hypertension:  Patient was earlier hypotensive and patient's home medications were held, lisinopril. Carvedilol 6.25 mg twice daily, blood pressure 160/84 today.  Slightly elevated for this admission  3.Atrial fibrillation Patient was on beta-blockers Patient is currently on Coreg Primary team is following   LOS: 6 Carl [name] 3/6/20232:34 PM

## 2021-10-14 NOTE — Care Management Important Message (Signed)
Important Message ? ?Patient Details  ?Name: Carl [name] ?MRN: [id number] ?Date of Birth: [birth year] ? ? ?Medicare Important Message Given:  Yes ? ? ? ? ?Olegario Messier A Zadie Deemer ?10/14/2021, 2:26 PM ?

## 2021-10-15 LAB — MAGNESIUM: Magnesium: 2 mg/dL (ref 1.7–2.4)

## 2021-10-15 LAB — SODIUM: Sodium: 131 mmol/L — ABNORMAL LOW (ref 135–145)

## 2021-10-15 MED ORDER — POLYETHYLENE GLYCOL 3350 17 G PO PACK: 17.0000 g | PACK | Freq: Every day | ORAL | 0 refills | Status: DC

## 2021-10-15 MED ORDER — SENNOSIDES-DOCUSATE SODIUM 8.6-50 MG PO TABS: 1.0000 | ORAL_TABLET | Freq: Two times a day (BID) | ORAL | 1 refills | Status: DC

## 2021-10-15 MED ORDER — SODIUM CHLORIDE 1 G PO TABS
1.0000 g | ORAL_TABLET | Freq: Two times a day (BID) | ORAL | 0 refills | Status: DC
Start: 2021-10-15 — End: 2022-06-20

## 2021-10-15 MED ORDER — MONTELUKAST SODIUM 10 MG PO TABS: 10.0000 mg | ORAL_TABLET | Freq: Every day | ORAL | Status: DC

## 2021-10-15 MED ORDER — LISINOPRIL 10 MG PO TABS
10.0000 mg | ORAL_TABLET | Freq: Every day | ORAL | Status: DC
Start: 2021-10-15 — End: 2021-10-15
  Administered 2021-10-15: 10 mg via ORAL
  Filled 2021-10-15: qty 1

## 2021-10-15 MED ORDER — ADULT MULTIVITAMIN W/MINERALS CH
1.0000 | ORAL_TABLET | Freq: Every day | ORAL | Status: DC
  Administered 2021-10-15: 1 via ORAL
  Filled 2021-10-15: qty 1

## 2021-10-15 MED ORDER — CARVEDILOL 6.25 MG PO TABS: 6.2500 mg | ORAL_TABLET | Freq: Two times a day (BID) | ORAL | 1 refills | Status: DC

## 2021-10-15 NOTE — Progress Notes (Signed)
[facility]  ROUNDING NOTE   Subjective:   Mr. Aleck Locklin was admitted to [facility] on 10/08/2021 for Hyponatremia [E87.1]  Patient was drinkin a great deal of water to compensate for having constipation. Patient states that he was following a fluid restriction prior to this admission.   Patient seen sitting up in chair Alert and oriented  States he feels well enough for discharge today  Objective:  Vital signs in last 24 hours:  Temp:  [97.3 F (36.3 C)-98 F (36.7 C)] 97.9 F (36.6 C) (03/07 0804) Pulse Rate:  [66-90] 72 (03/07 0804) Resp:  [16-18] 16 (03/07 0804) BP: (140-158)/(72-79) 140/72 (03/07 0804) SpO2:  [94 %-100 %] 94 % (03/07 0804)  Weight change:  Filed Weights   10/08/21 1930 10/09/21 0233 10/12/21 0500  Weight: 131.5 kg 125.2 kg 129.7 kg    Intake/Output: I/O last 3 completed shifts: In: 1440 [P.O.:1440] Out: 0    Intake/Output this shift:  No intake/output data recorded.  Physical Exam: General: NAD  Head: Normocephalic, atraumatic. Moist oral mucosal membranes  Eyes: Anicteric  Lungs:  Clear to auscultation, normal effort  Heart: Regular rate and rhythm  Abdomen:  Soft, nontender  Extremities:  no peripheral edema.  Neurologic: Nonfocal, moving all four extremities  Skin: No lesions        Basic Metabolic Panel: Recent Labs  Lab 10/09/21 0455 10/09/21 0721 10/09/21 1116 10/10/21 0150 10/10/21 0607 10/11/21 0318 10/11/21 0754 10/12/21 0433 10/12/21 0820 10/12/21 1805 10/13/21 0750 10/13/21 1751 10/14/21 0924 10/14/21 1804 10/15/21 0420 10/15/21 0759  NA 123* 124*   < > 127*   < > 125*   < > 130*   < > 125* 128* 130* 128* 130*  --  131*  K 4.5 4.9  --  4.8  --  4.8  --  4.6  --   --   --   --   --   --   --   --   CL 87* 87*  --  93*  --  93*  --  97*  --   --   --   --   --   --   --   --   CO2 29 31  --  28  --  26  --  23  --   --   --   --   --   --   --   --   GLUCOSE 155* 150*  --  92  --  113*  --   117*  --   --   --   --   --   --   --   --   BUN 9 9  --  13  --  17  --  16  --   --   --   --   --   --   --   --   CREATININE 0.58* 0.68  --  0.77  --  0.77  --  0.55*  --   --   --   --   --   --   --   --   CALCIUM 8.9 8.7*  --  8.6*  --  8.8*  --  8.9  --   --   --   --   --   --   --   --   MG  --   --   --   --   --  2.1  --  2.1  --  1.9  --   --  2.2  --  2.0  --    < > = values in this interval not displayed.     Liver Function Tests: No results for input(s): AST, ALT, ALKPHOS, BILITOT, PROT, ALBUMIN in the last 168 hours. No results for input(s): LIPASE, AMYLASE in the last 168 hours. No results for input(s): AMMONIA in the last 168 hours.  CBC: Recent Labs  Lab 10/08/21 1933 10/10/21 0150 10/11/21 0318 10/12/21 0433  WBC 5.0 4.8 6.3 5.4  HGB 13.8 14.0 13.7 14.2  HCT 40.1 42.1 40.8 42.3  MCV 79.1* 81.3 81.3 80.7  PLT 210 189 220 238     Cardiac Enzymes: No results for input(s): CKTOTAL, CKMB, CKMBINDEX, TROPONINI in the last 168 hours.  BNP: Invalid input(s): POCBNP  CBG: Recent Labs  Lab 10/09/21 0220  GLUCAP 151*     Microbiology: Results for orders placed or performed during the hospital encounter of 10/08/21  Resp Panel by RT-PCR (Flu A&B, Covid) Nasopharyngeal Swab     Status: None   Collection Time: 10/08/21 12:30 AM   Specimen: Nasopharyngeal Swab; Nasopharyngeal(NP) swabs in vial transport medium  Result Value Ref Range Status   SARS Coronavirus 2 by RT PCR NEGATIVE NEGATIVE Final    Comment: (NOTE) SARS-CoV-2 target nucleic acids are NOT DETECTED.  The SARS-CoV-2 RNA is generally detectable in upper respiratory specimens during the acute phase of infection. The lowest concentration of SARS-CoV-2 viral copies this assay can detect is 138 copies/mL. A negative result does not preclude SARS-Cov-2 infection and should not be used as the sole basis for treatment or other patient management decisions. A negative result may occur with   improper specimen collection/handling, submission of specimen other than nasopharyngeal swab, presence of viral mutation(s) within the areas targeted by this assay, and inadequate number of viral copies(<138 copies/mL). A negative result must be combined with clinical observations, patient history, and epidemiological information. The expected result is Negative.  Fact Sheet for Patients:  BloggerCourse.com  Fact Sheet for Healthcare Providers:  SeriousBroker.it  This test is no t yet approved or cleared by the [place] FDA and  has been authorized for detection and/or diagnosis of SARS-CoV-2 by FDA under an Emergency Use Authorization (EUA). This EUA will remain  in effect (meaning this test can be used) for the duration of the COVID-19 declaration under Section 564(b)(1) of the Act, 21 U.S.C.section 360bbb-3(b)(1), unless the authorization is terminated  or revoked sooner.       Influenza A by PCR NEGATIVE NEGATIVE Final   Influenza B by PCR NEGATIVE NEGATIVE Final    Comment: (NOTE) The Xpert Xpress SARS-CoV-2/FLU/RSV plus assay is intended as an aid in the diagnosis of influenza from Nasopharyngeal swab specimens and should not be used as a sole basis for treatment. Nasal washings and aspirates are unacceptable for Xpert Xpress SARS-CoV-2/FLU/RSV testing.  Fact Sheet for Patients: BloggerCourse.com  Fact Sheet for Healthcare Providers: SeriousBroker.it  This test is not yet approved or cleared by the [place] FDA and has been authorized for detection and/or diagnosis of SARS-CoV-2 by FDA under an Emergency Use Authorization (EUA). This EUA will remain in effect (meaning this test can be used) for the duration of the COVID-19 declaration under Section 564(b)(1) of the Act, 21 U.S.C. section 360bbb-3(b)(1), unless the authorization is terminated  or revoked.  Performed at [facility], [address] [place], [place] [address]   MRSA Next Gen by PCR,  Nasal     Status: None   Collection Time: 10/09/21  3:38 AM   Specimen: Nasal Mucosa; Nasal Swab  Result Value Ref Range Status   MRSA by PCR Next Gen NOT DETECTED NOT DETECTED Final    Comment: (NOTE) The GeneXpert MRSA Assay (FDA approved for NASAL specimens only), is one component of a comprehensive MRSA colonization surveillance program. It is not intended to diagnose MRSA infection nor to guide or monitor treatment for MRSA infections. Test performance is not FDA approved in patients less than 43 years old. Performed at [facility], [address] [place], [place] [address]     Coagulation Studies: No results for input(s): LABPROT, INR in the last 72 hours.  Urinalysis: No results for input(s): COLORURINE, LABSPEC, PHURINE, GLUCOSEU, HGBUR, BILIRUBINUR, KETONESUR, PROTEINUR, UROBILINOGEN, NITRITE, LEUKOCYTESUR in the last 72 hours.  Invalid input(s): APPERANCEUR    Imaging: No results found.   Medications:      ascorbic acid  1,000 mg Oral Daily   budesonide (PULMICORT) nebulizer solution  0.25 mg Nebulization BID   carvedilol  6.25 mg Oral BID WC   Chlorhexidine Gluconate Cloth  6 each Topical Daily   enoxaparin (LOVENOX) injection  60 mg Subcutaneous Q24H   folic acid  1 mg Oral Daily   lisinopril  10 mg Oral Daily   montelukast  10 mg Oral QHS   multivitamin with minerals  1 tablet Oral Daily   pantoprazole  40 mg Oral Daily   polyethylene glycol  17 g Oral Daily   senna-docusate  1 tablet Oral BID   sodium chloride  2 g Oral BID WC   albuterol, docusate sodium  Assessment/ Plan:  Mr. Altair Appenzeller is a 71 y.o. white male Asperger's, asthma, atrial fibrillation, diverticulosis, GERD, hypertension, psoriasis who is admitted to [facility] on 10/08/2021 for Hyponatremia [E87.1]   Hyponatremia: secondary to polydipsia: urine  specific gravity of 1.000, urine osm of 113.  Patient sodium level has improved from 109 on February 28 to now 130 on March 4. Patient has had a change of 21 mEq in 4 days  Low threshold to administer Dextrose infusion  Sodium improved to 131 today.  Patient cleared from discharge from renal stance.  Encourage to follow-up with primary care provider  Hypertension:  Patient was earlier hypotensive and patient's home medications were held, lisinopril. Carvedilol 6.25 mg twice daily, BP currently 140/72  3.Atrial fibrillation Patient was on beta-blockers Patient is currently on Coreg Primary team is following   LOS: 7 Khira Cudmore 3/7/20231:18 PM

## 2021-10-15 NOTE — Discharge Instructions (Signed)
Limit your water intake to 1500cc/day ?

## 2021-10-15 NOTE — Discharge Summary (Signed)
Physician Discharge Summary   Patient: Carl [name] MRN: [id number] DOB: [birth year]  Admit date:     10/08/2021  Discharge date: 10/15/21  Discharge Physician: Enedina Finner   PCP: Gracelyn Nurse, MD   Recommendations at discharge:    Pt advised to limit water to 1500 cc /day F/u PCP in 1-2 weeks  Discharge Diagnoses: Recurrent hyponatremia secondary to excess water intake   Hospital Course: Carl [name] is a 71 yo male with PMH as Asperger's syndrome, A-fib, HTN, recurrent hyponatremia who again presented with symptomatic hyponatremia.  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 Recent hospitalizations include 04/2021, 06/2021, and 07/2021.  He is typically placed on fluid restriction which improves sodium levels.   Hyponatremia - Recurrent severe hyponatremia, 109 on admission - Treated with 3% saline with rapid correction which was then discontinued -Salt tablets also started per nephrology--will cont at discharge for now and defer to PCP/Nephrology to determine long terma use of it as out pt -Continue oral fluid restriction--1500cc --Na 130--ok with dr Thedore Mins for d/c   Asperger's syndrome - patient live's alone; no extra resources avail at home, but noted patient continues to have recurrent hospitalizations for recurrent hyponatremia --pt has brothers in the area who he stays in touch with   Atrial fibrillation (HCC) - now on coreg (per Dr Wolfgang Phoenix) -No anticoagulation noted on home med rec   HTN --hypotension- resolved as of 10/14/2021 - on coreg as BP has improved --resume Lisinopril at discharge    patient ambulatory and independent in the room. Physical therapy recommends no PT needs. Will discharge patient to home. Patient is in agreement.      Consultants: nephrology Procedures performed: none Disposition: Home Diet recommendation:  Discharge Diet Orders (From admission, onward)      Start     Ordered   10/15/21 0000  Diet - low sodium heart healthy        10/15/21 0819           Cardiac diet DISCHARGE MEDICATION: Allergies as of 10/15/2021   No Known Allergies      Medication List     STOP taking these medications    atenolol 25 MG tablet Commonly known as: TENORMIN       TAKE these medications    albuterol 108 (90 Base) MCG/ACT inhaler Commonly known as: VENTOLIN HFA Inhale 2 puffs into the lungs every 6 (six) hours as needed for wheezing.   ascorbic acid 1000 MG tablet Commonly known as: VITAMIN C Take 1,000 mg by mouth daily.   B COMPLEX PO Take 1 tablet by mouth daily.   carvedilol 6.25 MG tablet Commonly known as: COREG Take 1 tablet (6.25 mg total) by mouth 2 (two) times daily with a meal.   fluticasone 110 MCG/ACT inhaler Commonly known as: FLOVENT HFA Inhale 1 puff into the lungs in the morning and at bedtime.   folic acid 1 MG tablet Commonly known as: FOLVITE Take 1 mg by mouth daily.   Garlic 100 MG Tabs Take 100 mg by mouth daily.   hyoscyamine 0.375 MG 12 hr tablet Commonly known as: LEVBID Take 1 tablet by mouth in the morning and at bedtime.   lisinopril 10 MG tablet Commonly known as: ZESTRIL Take 10 mg by mouth daily.   montelukast 10 MG tablet Commonly known as: SINGULAIR Take 10 mg by mouth at bedtime.   multivitamin tablet Take 1 tablet by mouth daily.   pantoprazole  40 MG tablet Commonly known as: PROTONIX Take 40 mg by mouth daily.   polyethylene glycol 17 g packet Commonly known as: MIRALAX / GLYCOLAX Take 17 g by mouth daily.   QC TUMERIC COMPLEX PO Take 1 tablet by mouth daily.   SALMON OIL PO Take 1 capsule by mouth daily.   senna-docusate 8.6-50 MG tablet Commonly known as: Senokot-S Take 1 tablet by mouth 2 (two) times daily.   sodium chloride 1 g tablet Take 1 tablet (1 g total) by mouth 2 (two) times daily with a meal.   Zinc Citrate-Phytase 25-500 MG Caps Take 1 capsule by  mouth daily.        Follow-up Information     Gracelyn Nurse, MD. Schedule an appointment as soon as possible for a visit in 1 week(s).   Specialty: Internal Medicine Why: hyponatremia Contact information: [address] [place] [address] [phone]         Mosetta Pigeon, MD. Schedule an appointment as soon as possible for a visit in 1 week(s).   Specialty: Nephrology Why: hyponatremia Contact information: [address] [place] [address] [phone]                Discharge Exam: Carl [name] Weights   10/08/21 1930 10/09/21 0233 10/12/21 0500  Weight: 131.5 kg 125.2 kg 129.7 kg     Condition at discharge: good  The results of significant diagnostics from this hospitalization (including imaging, microbiology, ancillary and laboratory) are listed below for reference.   Imaging Studies: No results found.  Microbiology: Results for orders placed or performed during the hospital encounter of 10/08/21  Resp Panel by RT-PCR (Flu A&B, Covid) Nasopharyngeal Swab     Status: None   Collection Time: 10/08/21 12:30 AM   Specimen: Nasopharyngeal Swab; Nasopharyngeal(NP) swabs in vial transport medium  Result Value Ref Range Status   SARS Coronavirus 2 by RT PCR NEGATIVE NEGATIVE Final    Comment: (NOTE) SARS-CoV-2 target nucleic acids are NOT DETECTED.  The SARS-CoV-2 RNA is generally detectable in upper respiratory specimens during the acute phase of infection. The lowest concentration of SARS-CoV-2 viral copies this assay can detect is 138 copies/mL. A negative result does not preclude SARS-Cov-2 infection and should not be used as the sole basis for treatment or other patient management decisions. A negative result may occur with  improper specimen collection/handling, submission of specimen other than nasopharyngeal swab, presence of viral mutation(s) within the areas targeted by this assay, and inadequate number of  viral copies(<138 copies/mL). A negative result must be combined with clinical observations, patient history, and epidemiological information. The expected result is Negative.  Fact Sheet for Patients:  BloggerCourse.com  Fact Sheet for Healthcare Providers:  SeriousBroker.it  This test is no t yet approved or cleared by the [place] FDA and  has been authorized for detection and/or diagnosis of SARS-CoV-2 by FDA under an Emergency Use Authorization (EUA). This EUA will remain  in effect (meaning this test can be used) for the duration of the COVID-19 declaration under Section 564(b)(1) of the Act, 21 U.S.C.section 360bbb-3(b)(1), unless the authorization is terminated  or revoked sooner.       Influenza A by PCR NEGATIVE NEGATIVE Final   Influenza B by PCR NEGATIVE NEGATIVE Final    Comment: (NOTE) The Xpert Xpress SARS-CoV-2/FLU/RSV plus assay is intended as an aid in the diagnosis of influenza from Nasopharyngeal swab specimens and should not be used as a sole basis for treatment.  Nasal washings and aspirates are unacceptable for Xpert Xpress SARS-CoV-2/FLU/RSV testing.  Fact Sheet for Patients: BloggerCourse.comhttps://www.fda.gov/media/152166/download  Fact Sheet for Healthcare Providers: SeriousBroker.ithttps://www.fda.gov/media/152162/download  This test is not yet approved or cleared by the [place] FDA and has been authorized for detection and/or diagnosis of SARS-CoV-2 by FDA under an Emergency Use Authorization (EUA). This EUA will remain in effect (meaning this test can be used) for the duration of the COVID-19 declaration under Section 564(b)(1) of the Act, 21 U.S.C. section 360bbb-3(b)(1), unless the authorization is terminated or revoked.  Performed at [facility], [address] [place], [place] [address]   MRSA Next Gen by PCR, Nasal     Status: None   Collection Time: 10/09/21  3:38 AM   Specimen: Nasal Mucosa;  Nasal Swab  Result Value Ref Range Status   MRSA by PCR Next Gen NOT DETECTED NOT DETECTED Final    Comment: (NOTE) The GeneXpert MRSA Assay (FDA approved for NASAL specimens only), is one component of a comprehensive MRSA colonization surveillance program. It is not intended to diagnose MRSA infection nor to guide or monitor treatment for MRSA infections. Test performance is not FDA approved in patients less than 70 years old. Performed at [facility], [address] [place], [place] [address]     Labs: CBC: Recent Labs  Lab 10/08/21 1933 10/10/21 0150 10/11/21 0318 10/12/21 0433  WBC 5.0 4.8 6.3 5.4  HGB 13.8 14.0 13.7 14.2  HCT 40.1 42.1 40.8 42.3  MCV 79.1* 81.3 81.3 80.7  PLT 210 189 220 238   Basic Metabolic Panel: Recent Labs  Lab 10/09/21 0455 10/09/21 0721 10/09/21 1116 10/10/21 0150 10/10/21 0607 10/11/21 0318 10/11/21 0754 10/12/21 0433 10/12/21 0820 10/12/21 1805 10/13/21 0750 10/13/21 1751 10/14/21 0924 10/14/21 1804 10/15/21 0420  NA 123* 124*   < > 127*   < > 125*   < > 130*   < > 125* 128* 130* 128* 130*  --   K 4.5 4.9  --  4.8  --  4.8  --  4.6  --   --   --   --   --   --   --   CL 87* 87*  --  93*  --  93*  --  97*  --   --   --   --   --   --   --   CO2 29 31  --  28  --  26  --  23  --   --   --   --   --   --   --   GLUCOSE 155* 150*  --  92  --  113*  --  117*  --   --   --   --   --   --   --   BUN 9 9  --  13  --  17  --  16  --   --   --   --   --   --   --   CREATININE 0.58* 0.68  --  0.77  --  0.77  --  0.55*  --   --   --   --   --   --   --   CALCIUM 8.9 8.7*  --  8.6*  --  8.8*  --  8.9  --   --   --   --   --   --   --   MG  --   --   --   --   --  2.1  --  2.1  --  1.9  --   --  2.2  --  2.0   < > = values in this interval not displayed.   Liver Function Tests: No results for input(s): AST, ALT, ALKPHOS, BILITOT, PROT, ALBUMIN in the last 168 hours. CBG: Recent Labs  Lab 10/09/21 0220  GLUCAP 151*     Discharge time spent: greater than 30 minutes.  Signed: Enedina Finner, MD [facility] 10/15/2021

## 2021-10-15 NOTE — Plan of Care (Signed)

## 2021-10-15 NOTE — Progress Notes (Signed)
Patient discharged to home with Taxi boucher and all belongings, wheeled out of unit by [place], Charity fundraiser to escort to [place].  VSS. A+Ox4. PIV x1 removed, no bleeding, intact. Medications and discharge instructions reviewed with patient by Leavy Cella, RN. Patient satisfied with overall care at [facility]. ?

## 2021-10-15 NOTE — Plan of Care (Signed)
?  Problem: Education: ?Goal: Knowledge of General Education information will improve ?Description: Including pain rating scale, medication(s)/side effects and non-pharmacologic comfort measures ?10/15/2021 1009 by Orvan Seen, RN ?Outcome: Completed/Met ?10/15/2021 0806 by Orvan Seen, RN ?Outcome: Progressing ?  ?Problem: Health Behavior/Discharge Planning: ?Goal: Ability to manage health-related needs will improve ?10/15/2021 1009 by Orvan Seen, RN ?Outcome: Completed/Met ?10/15/2021 0806 by Orvan Seen, RN ?Outcome: Progressing ?  ?Problem: Clinical Measurements: ?Goal: Ability to maintain clinical measurements within normal limits will improve ?10/15/2021 1009 by Orvan Seen, RN ?Outcome: Completed/Met ?10/15/2021 0806 by Orvan Seen, RN ?Outcome: Progressing ?Goal: Will remain free from infection ?10/15/2021 1009 by Orvan Seen, RN ?Outcome: Completed/Met ?10/15/2021 0806 by Orvan Seen, RN ?Outcome: Progressing ?Goal: Diagnostic test results will improve ?10/15/2021 1009 by Orvan Seen, RN ?Outcome: Completed/Met ?10/15/2021 0806 by Orvan Seen, RN ?Outcome: Progressing ?Goal: Respiratory complications will improve ?10/15/2021 1009 by Orvan Seen, RN ?Outcome: Completed/Met ?10/15/2021 0806 by Orvan Seen, RN ?Outcome: Progressing ?Goal: Cardiovascular complication will be avoided ?10/15/2021 1009 by Orvan Seen, RN ?Outcome: Completed/Met ?10/15/2021 0806 by Orvan Seen, RN ?Outcome: Progressing ?  ?Problem: Activity: ?Goal: Risk for activity intolerance will decrease ?10/15/2021 1009 by Orvan Seen, RN ?Outcome: Completed/Met ?10/15/2021 0806 by Orvan Seen, RN ?Outcome: Progressing ?  ?Problem: Nutrition: ?Goal: Adequate nutrition will be maintained ?10/15/2021 1009 by Orvan Seen, RN ?Outcome: Completed/Met ?10/15/2021 0806 by Orvan Seen, RN ?Outcome: Progressing ?  ?Problem: Coping: ?Goal: Level of anxiety will decrease ?10/15/2021 1009 by Orvan Seen, RN ?Outcome: Completed/Met ?10/15/2021 0806 by Orvan Seen, RN ?Outcome: Progressing ?  ?Problem: Elimination: ?Goal: Will not experience complications related to bowel motility ?10/15/2021 1009 by Orvan Seen, RN ?Outcome: Completed/Met ?10/15/2021 0806 by Orvan Seen, RN ?Outcome: Progressing ?Goal: Will not experience complications related to urinary retention ?10/15/2021 1009 by Orvan Seen, RN ?Outcome: Completed/Met ?10/15/2021 0806 by Orvan Seen, RN ?Outcome: Progressing ?  ?Problem: Pain Managment: ?Goal: General experience of comfort will improve ?10/15/2021 1009 by Orvan Seen, RN ?Outcome: Completed/Met ?10/15/2021 0806 by Orvan Seen, RN ?Outcome: Progressing ?  ?Problem: Safety: ?Goal: Ability to remain free from injury will improve ?10/15/2021 1009 by Orvan Seen, RN ?Outcome: Completed/Met ?10/15/2021 0806 by Orvan Seen, RN ?Outcome: Progressing ?  ?Problem: Skin Integrity: ?Goal: Risk for impaired skin integrity will decrease ?10/15/2021 1009 by Orvan Seen, RN ?Outcome: Completed/Met ?10/15/2021 0806 by Orvan Seen, RN ?Outcome: Progressing ?  ?

## 2022-01-04 ENCOUNTER — Emergency Department
Admission: EM | Admit: 2022-01-04 | Discharge: 2022-01-04 | Disposition: A | Discharge status: Home / Self Care | Payer: Medicare Other | Attending: Emergency Medicine | Admitting: Emergency Medicine

## 2022-01-04 ENCOUNTER — Emergency Department: Payer: Medicare Other

## 2022-01-04 ENCOUNTER — Other Ambulatory Visit: Payer: Self-pay

## 2022-01-04 DIAGNOSIS — R600 Localized edema: Secondary | ICD-10-CM | POA: Insufficient documentation

## 2022-01-04 DIAGNOSIS — M7989 Other specified soft tissue disorders: Secondary | ICD-10-CM | POA: Diagnosis present

## 2022-01-04 DIAGNOSIS — R609 Edema, unspecified: Secondary | ICD-10-CM

## 2022-01-04 LAB — CBC
HCT: 39.2 % (ref 39.0–52.0)
Hemoglobin: 12.3 g/dL — ABNORMAL LOW (ref 13.0–17.0)
MCH: 26.6 pg (ref 26.0–34.0)
MCHC: 31.4 g/dL (ref 30.0–36.0)
MCV: 84.7 fL (ref 80.0–100.0)
Platelets: 271 10*3/uL (ref 150–400)
RBC: 4.63 MIL/uL (ref 4.22–5.81)
RDW: 14.7 % (ref 11.5–15.5)
WBC: 5.3 10*3/uL (ref 4.0–10.5)
nRBC: 0 % (ref 0.0–0.2)

## 2022-01-04 LAB — BASIC METABOLIC PANEL
Anion gap: 10 (ref 5–15)
BUN: 16 mg/dL (ref 8–23)
CO2: 25 mmol/L (ref 22–32)
Calcium: 9.1 mg/dL (ref 8.9–10.3)
Chloride: 94 mmol/L — ABNORMAL LOW (ref 98–111)
Creatinine, Ser: 0.85 mg/dL (ref 0.61–1.24)
GFR, Estimated: >60 mL/min (ref >60)
Glucose, Bld: 116 mg/dL — ABNORMAL HIGH (ref 70–99)
Potassium: 4.5 mmol/L (ref 3.5–5.1)
Sodium: 129 mmol/L — ABNORMAL LOW (ref 135–145)

## 2022-01-04 LAB — BRAIN NATRIURETIC PEPTIDE: B Natriuretic Peptide: 145.8 pg/mL — ABNORMAL HIGH (ref 0.0–100.0)

## 2022-01-04 MED ORDER — FUROSEMIDE 20 MG PO TABS: 20.0000 mg | ORAL_TABLET | Freq: Every day | ORAL | 11 refills | Status: DC

## 2022-01-04 MED ORDER — FUROSEMIDE 10 MG/ML IJ SOLN
20.0000 mg | Freq: Once | INTRAMUSCULAR | Status: AC
  Administered 2022-01-04: 20 mg via INTRAVENOUS
  Filled 2022-01-04: qty 4

## 2022-01-04 NOTE — ED Notes (Signed)
Pt trying to find ride

## 2022-01-04 NOTE — ED Notes (Signed)
Pt has friend picking him up. Pt gave verbal consent to DC and refused DC vitals.

## 2022-01-04 NOTE — Discharge Instructions (Addendum)
Please continue to take your salt pills as you have been.  Please take the Lasix 1 pill a day.  Remember it will make you urinate a lot for several hours.  Do not take it right before bed.  Return for any increasing swelling or redness or pain in your legs.  Return for any shortness of breath.  Also return if you feel worried.  Follow-up here in the ER on Sunday to get your sodium rechecked.  Also follow-up on Tuesday with your primary care doctor.

## 2022-01-04 NOTE — ED Provider Notes (Signed)
[facility] Provider Note    Event Date/Time   First MD Initiated Contact with Patient 01/04/22 1437     (approximate)   History   Leg Swelling   HPI  Marcella Dunnaway is a 71 y.o. male who reports he has had swelling in his cast for quite some time but here lately in the last week or so it is gone up into his thighs.  He has no other problems.  They are not really painful.  He has no shortness of breath or pain with breathing.      Physical Exam   Triage Vital Signs: ED Triage Vitals  Enc Vitals Group     BP 01/04/22 1252 (!) 144/101     Pulse Rate 01/04/22 1252 77     Resp 01/04/22 1252 19     Temp 01/04/22 1252 98 F (36.7 C)     Temp src --      SpO2 01/04/22 1252 94 %     Weight --      Height 01/04/22 1249 6\' 4"  (1.93 m)     Head Circumference --      Peak Flow --      Pain Score 01/04/22 1249 0     Pain Loc --      Pain Edu? --      Excl. in GC? --     Most recent vital signs: Vitals:   01/04/22 1252 01/04/22 1604  BP: (!) 144/101 (!) 147/98  Pulse: 77 69  Resp: 19 20  Temp: 98 F (36.7 C) 98.2 F (36.8 C)  SpO2: 94% 95%     General: Awake, no distress.  CV:  Good peripheral perfusion.  Heart regular rate and rhythm no audible murmurs Resp:  Normal effort.  Lungs clear but with some crackles in the bases Abd:  No distention.  Soft and nontender Extremities: Lower legs with vascular stasis changes in the skin.  There is marked edema up to the knees and slightly less edema well above the knees.   ED Results / Procedures / Treatments   Labs (all labs ordered are listed, but only abnormal results are displayed) Labs Reviewed  BASIC METABOLIC PANEL - Abnormal; Notable for the following components:      Result Value   Sodium 129 (*)    Chloride 94 (*)    Glucose, Bld 116 (*)    All other components within normal limits  CBC - Abnormal; Notable for the following components:   Hemoglobin 12.3 (*)    All other  components within normal limits  BRAIN NATRIURETIC PEPTIDE - Abnormal; Notable for the following components:   B Natriuretic Peptide 145.8 (*)    All other components within normal limits     EKG     RADIOLOGY Bilateral leg ultrasound shows no DVT I also reviewed and interpreted the study. Chest x-ray read by radiology reviewed and interpreted by me as well and shows no acute disease. PROCEDURES:  Critical Care performed:   Procedures   MEDICATIONS ORDERED IN ED: Medications  furosemide (LASIX) injection 20 mg (20 mg Intravenous Given 01/04/22 1603)     IMPRESSION / MDM / ASSESSMENT AND PLAN / ED COURSE  I reviewed the triage vital signs and the nursing notes. Patient with minimally elevated BNP of 145 with leg edema.  He has been urinating a lot feels okay now.  I will have him follow-up with primary care.  He says he has been  taking salt pills twice a day chronically.  I will have him continue this.  I will suggest that he gets his sodium redone rechecked on Tuesday.  If he has any problems with anything at all he should return before that.   Patient's presentation is most consistent with exacerbation of chronic illness.        FINAL CLINICAL IMPRESSION(S) / ED DIAGNOSES   Final diagnoses:  Peripheral edema     Rx / DC Orders   ED Discharge Orders          Ordered    furosemide (LASIX) 20 MG tablet  Daily        01/04/22 1612             Note:  This document was prepared using Dragon voice recognition software and may include unintentional dictation errors.   Arnaldo Natal, MD 01/04/22 [phone]

## 2022-01-04 NOTE — ED Triage Notes (Addendum)
Patient to ER via POV with complaints of bilateral leg swelling, reports swelling increased over the last 24 hours but has been gradual. Swelling is also present to bilateral thighs. Denies shortness of breath. Patient with abdominal distension.   Patient also reports he was recently treated for a UTI and wants to know if this has cleared up. Reports no symptoms.

## 2022-02-26 ENCOUNTER — Telehealth: Payer: Self-pay | Admitting: *Deleted

## 2022-02-26 DIAGNOSIS — I480 Paroxysmal atrial fibrillation: Secondary | ICD-10-CM

## 2022-02-27 ENCOUNTER — Encounter: Payer: Self-pay | Admitting: Internal Medicine

## 2022-02-27 ENCOUNTER — Encounter
Admission: RE | Disposition: A | Discharge status: Home / Self Care | Payer: Self-pay | Source: Ambulatory Visit | Attending: Internal Medicine

## 2022-02-27 ENCOUNTER — Other Ambulatory Visit: Payer: Self-pay

## 2022-02-27 ENCOUNTER — Ambulatory Visit: Payer: Medicare Other | Admitting: Anesthesiology

## 2022-02-27 ENCOUNTER — Ambulatory Visit
Admission: RE | Admit: 2022-02-27 | Discharge: 2022-02-27 | Disposition: A | Discharge status: Home / Self Care | Payer: Medicare Other | Source: Ambulatory Visit | Attending: Internal Medicine | Admitting: Internal Medicine

## 2022-02-27 DIAGNOSIS — J45909 Unspecified asthma, uncomplicated: Secondary | ICD-10-CM | POA: Insufficient documentation

## 2022-02-27 DIAGNOSIS — Z7901 Long term (current) use of anticoagulants: Secondary | ICD-10-CM | POA: Insufficient documentation

## 2022-02-27 DIAGNOSIS — I10 Essential (primary) hypertension: Secondary | ICD-10-CM | POA: Diagnosis not present

## 2022-02-27 DIAGNOSIS — F840 Autistic disorder: Secondary | ICD-10-CM | POA: Diagnosis not present

## 2022-02-27 DIAGNOSIS — I480 Paroxysmal atrial fibrillation: Secondary | ICD-10-CM | POA: Diagnosis present

## 2022-02-27 DIAGNOSIS — Z6835 Body mass index (BMI) 35.0-35.9, adult: Secondary | ICD-10-CM | POA: Insufficient documentation

## 2022-02-27 DIAGNOSIS — K219 Gastro-esophageal reflux disease without esophagitis: Secondary | ICD-10-CM | POA: Insufficient documentation

## 2022-02-27 DIAGNOSIS — E669 Obesity, unspecified: Secondary | ICD-10-CM | POA: Insufficient documentation

## 2022-02-27 HISTORY — PX: CARDIOVERSION: SHX1299

## 2022-02-27 SURGERY — CARDIOVERSION
Anesthesia: General

## 2022-02-27 MED ORDER — SODIUM CHLORIDE 0.9 % IV SOLN: INTRAVENOUS | Status: DC

## 2022-02-27 MED ORDER — PROPOFOL 10 MG/ML IV BOLUS
INTRAVENOUS | Status: DC | PRN
  Administered 2022-02-27: 20 mg via INTRAVENOUS
  Administered 2022-02-27: 40 mg via INTRAVENOUS

## 2022-02-27 NOTE — Anesthesia Procedure Notes (Signed)
Date/Time: 02/27/2022 8:12 AM  Performed by: Ginger Carne, CRNAPre-anesthesia Checklist: Patient identified, Emergency Drugs available, Suction available, Patient being monitored and Timeout performed Patient Re-evaluated:Patient Re-evaluated prior to induction Oxygen Delivery Method: Nasal cannula Preoxygenation: Pre-oxygenation with 100% oxygen Induction Type: IV induction

## 2022-02-27 NOTE — Anesthesia Preprocedure Evaluation (Addendum)
Anesthesia Evaluation  Patient identified by MRN, date of birth, ID band Patient awake    Reviewed: Allergy & Precautions, NPO status , Patient's Chart, lab work & pertinent test results  History of Anesthesia Complications Negative for: history of anesthetic complications  Airway Mallampati: III   Neck ROM: Full    Dental  (+) Missing   Pulmonary asthma ,    Pulmonary exam normal breath sounds clear to auscultation       Cardiovascular hypertension, + dysrhythmias (a fib on Eliquis) + Valvular Problems/Murmurs MR  Rhythm:Irregular Rate:Normal  Myocardial perfusion 12/24/21:  Indeterminate treadmill EKG due to baseline EKG changes of atrial fibrillation  Normal myocardial perfusion without evidence of myocardial ischemia   Echo 12/24/21:  NORMAL LEFT VENTRICULAR SYSTOLIC FUNCTION  NORMAL RIGHT VENTRICULAR SYSTOLIC FUNCTION  MODERATE VALVULAR REGURGITATION   NO VALVULAR STENOSIS    Neuro/Psych PSYCHIATRIC DISORDERS (autism spectrum disorder) negative neurological ROS     GI/Hepatic GERD  ,  Endo/Other  diabetes, Type 2Obesity   Renal/GU negative Renal ROS     Musculoskeletal   Abdominal   Peds  Hematology  (+) Blood dyscrasia, anemia ,   Anesthesia Other Findings Cardiology note 02/06/22:  1. Paroxysmal atrial fibrillation: Currently in atrial fibrillation at a controlled rate of 77 bpm. -Continue atenolol for heart rate control -Continue flecainide 100 mg twice daily for better rhythm control and possible conversion to normal sinus rhythm -Continue Eliquis 5 mg twice daily for stroke risk reduction with atrial fibrillation. This is the appropriate dose as the patient is under 19 years old and has a weight of greater than 60 kg -Proceed to electrical cardioversion of atrial fibrillation for improvements of symptoms, risk reduction in stroke, and reduced development of congestive heart failure. Patient understands  risk and benefits of electrical cardioversion to normal sinus rhythm. This includes the possibility of death, stroke, heart attack, further rhythm disturbances, and reaction to medication management. Patient is at low risk for general anesthesia.  2. Hypertension: Controlled in the office today at 130/80 -Continue amlodipine, atenolol, lisinopril with no changes   Reproductive/Obstetrics                           Anesthesia Physical Anesthesia Plan  ASA: 3  Anesthesia Plan: General   Post-op Pain Management:    Induction: Intravenous  PONV Risk Score and Plan: 2 and Propofol infusion, TIVA and Treatment may vary due to age or medical condition  Airway Management Planned: Natural Airway  Additional Equipment:   Intra-op Plan:   Post-operative Plan:   Informed Consent: I have reviewed the patients History and Physical, chart, labs and discussed the procedure including the risks, benefits and alternatives for the proposed anesthesia with the patient or authorized representative who has indicated his/her understanding and acceptance.       Plan Discussed with: CRNA  Anesthesia Plan Comments: (LMA/GETA backup discussed.  Patient consented for risks of anesthesia including but not limited to:  - adverse reactions to medications - damage to eyes, teeth, lips or other oral mucosa - nerve damage due to positioning  - sore throat or hoarseness - damage to heart, brain, nerves, lungs, other parts of body or loss of life  Informed patient about role of CRNA in peri- and intra-operative care.  Patient voiced understanding.)        Anesthesia Quick Evaluation

## 2022-02-27 NOTE — Anesthesia Postprocedure Evaluation (Signed)
Anesthesia Post Note  Patient: Carl [name]  Procedure(s) Performed: CARDIOVERSION  Patient location during evaluation: PACU Anesthesia Type: General Level of consciousness: awake and alert, oriented and patient cooperative Pain management: pain level controlled Vital Signs Assessment: post-procedure vital signs reviewed and stable Respiratory status: spontaneous breathing, nonlabored ventilation and respiratory function stable Cardiovascular status: blood pressure returned to baseline and stable Postop Assessment: adequate PO intake Anesthetic complications: no   No notable events documented.   Last Vitals:  Vitals:   02/27/22 0825 02/27/22 0830  BP: 110/68 117/64  Pulse: (!) 56 (!) 58  Resp: 13 12  Temp:    SpO2: 99% 96%    Last Pain:  Vitals:   02/27/22 0737  PainSc: 0-No pain                 Reed Breech

## 2022-02-27 NOTE — CV Procedure (Signed)
Electrical Cardioversion Procedure Note Carl [name] [id number] [birth year]  Procedure: Electrical Cardioversion Indications:  Paroxysmal non valvular atrial fibrillation  Procedure Details Consent: Risks of procedure as well as the alternatives and risks of each were explained to the (patient/caregiver).  Consent for procedure obtained. Time Out: Verified patient identification, verified procedure, site/side was marked, verified correct patient position, special equipment/implants available, medications/allergies/relevent history reviewed, required imaging and test results available.  Performed  Patient placed on cardiac monitor, pulse oximetry, supplemental oxygen as necessary.  Sedation given: Propofol and versed as per anesthesia  Pacer pads placed anterior and posterior chest.  Cardioverted 1 time(s).  Cardioverted at 120J.  Evaluation Findings: Post procedure EKG shows: NSR Complications: None Patient did tolerate procedure well.   [name] M.D. [facility] 02/27/2022, 8:16 AM

## 2022-02-27 NOTE — Transfer of Care (Signed)
Immediate Anesthesia Transfer of Care Note  Patient: Carl [name]  Procedure(s) Performed: CARDIOVERSION  Patient Location: PACU  Anesthesia Type:General  Level of Consciousness: awake, oriented and drowsy  Airway & Oxygen Therapy: Patient Spontanous Breathing and Patient connected to nasal cannula oxygen  Post-op Assessment: Report given to RN and Post -op Vital signs reviewed and stable  Post vital signs: Reviewed and stable  Last Vitals:  Vitals Value Taken Time  BP 101/58 02/27/22 0821  Temp    Pulse 55 02/27/22 0821  Resp 12 02/27/22 0821  SpO2 99 % 02/27/22 0821    Last Pain:  Vitals:   02/27/22 0737  PainSc: 0-No pain         Complications: No notable events documented.

## 2022-02-28 ENCOUNTER — Encounter: Payer: Self-pay | Admitting: Internal Medicine

## 2022-06-17 ENCOUNTER — Emergency Department: Payer: Medicare Other

## 2022-06-17 ENCOUNTER — Inpatient Hospital Stay
Admission: EM | Admit: 2022-06-17 | Discharge: 2022-06-20 | DRG: 641 | Disposition: A | Discharge status: Home / Self Care | Payer: Medicare Other | Attending: Internal Medicine | Admitting: Internal Medicine

## 2022-06-17 DIAGNOSIS — K219 Gastro-esophageal reflux disease without esophagitis: Secondary | ICD-10-CM | POA: Diagnosis present

## 2022-06-17 DIAGNOSIS — F845 Asperger's syndrome: Secondary | ICD-10-CM | POA: Diagnosis present

## 2022-06-17 DIAGNOSIS — I872 Venous insufficiency (chronic) (peripheral): Secondary | ICD-10-CM | POA: Diagnosis not present

## 2022-06-17 DIAGNOSIS — I480 Paroxysmal atrial fibrillation: Secondary | ICD-10-CM | POA: Diagnosis present

## 2022-06-17 DIAGNOSIS — E871 Hypo-osmolality and hyponatremia: Secondary | ICD-10-CM | POA: Diagnosis not present

## 2022-06-17 DIAGNOSIS — Z7901 Long term (current) use of anticoagulants: Secondary | ICD-10-CM

## 2022-06-17 DIAGNOSIS — I10 Essential (primary) hypertension: Secondary | ICD-10-CM | POA: Diagnosis present

## 2022-06-17 DIAGNOSIS — Z79899 Other long term (current) drug therapy: Secondary | ICD-10-CM

## 2022-06-17 DIAGNOSIS — E860 Dehydration: Secondary | ICD-10-CM | POA: Diagnosis present

## 2022-06-17 DIAGNOSIS — Z8614 Personal history of Methicillin resistant Staphylococcus aureus infection: Secondary | ICD-10-CM

## 2022-06-17 DIAGNOSIS — E8770 Fluid overload, unspecified: Secondary | ICD-10-CM | POA: Diagnosis present

## 2022-06-17 DIAGNOSIS — I4891 Unspecified atrial fibrillation: Secondary | ICD-10-CM | POA: Diagnosis present

## 2022-06-17 DIAGNOSIS — J45909 Unspecified asthma, uncomplicated: Secondary | ICD-10-CM | POA: Diagnosis present

## 2022-06-17 DIAGNOSIS — J209 Acute bronchitis, unspecified: Secondary | ICD-10-CM

## 2022-06-17 DIAGNOSIS — Z1152 Encounter for screening for COVID-19: Secondary | ICD-10-CM

## 2022-06-17 DIAGNOSIS — L8990 Pressure ulcer of unspecified site, unspecified stage: Secondary | ICD-10-CM | POA: Insufficient documentation

## 2022-06-17 DIAGNOSIS — L89311 Pressure ulcer of right buttock, stage 1: Secondary | ICD-10-CM | POA: Diagnosis present

## 2022-06-17 DIAGNOSIS — I878 Other specified disorders of veins: Secondary | ICD-10-CM | POA: Diagnosis present

## 2022-06-17 DIAGNOSIS — J208 Acute bronchitis due to other specified organisms: Secondary | ICD-10-CM | POA: Diagnosis present

## 2022-06-17 DIAGNOSIS — R0602 Shortness of breath: Secondary | ICD-10-CM | POA: Diagnosis not present

## 2022-06-17 DIAGNOSIS — E119 Type 2 diabetes mellitus without complications: Secondary | ICD-10-CM | POA: Diagnosis present

## 2022-06-17 LAB — CBC WITH DIFFERENTIAL/PLATELET
Abs Immature Granulocytes: 0.02 10*3/uL (ref 0.00–0.07)
Basophils Absolute: 0 10*3/uL (ref 0.0–0.1)
Basophils Relative: 0 %
Eosinophils Absolute: 0.2 10*3/uL (ref 0.0–0.5)
Eosinophils Relative: 2 %
HCT: 32.8 % — ABNORMAL LOW (ref 39.0–52.0)
Hemoglobin: 11.2 g/dL — ABNORMAL LOW (ref 13.0–17.0)
Immature Granulocytes: 0 %
Lymphocytes Relative: 6 %
Lymphs Abs: 0.5 10*3/uL — ABNORMAL LOW (ref 0.7–4.0)
MCH: 27.3 pg (ref 26.0–34.0)
MCHC: 34.1 g/dL (ref 30.0–36.0)
MCV: 79.8 fL — ABNORMAL LOW (ref 80.0–100.0)
Monocytes Absolute: 1.1 10*3/uL — ABNORMAL HIGH (ref 0.1–1.0)
Monocytes Relative: 14 %
Neutro Abs: 6.4 10*3/uL (ref 1.7–7.7)
Neutrophils Relative %: 78 %
Platelets: 238 10*3/uL (ref 150–400)
RBC: 4.11 MIL/uL — ABNORMAL LOW (ref 4.22–5.81)
RDW: 15.2 % (ref 11.5–15.5)
WBC: 8.3 10*3/uL (ref 4.0–10.5)
nRBC: 0 % (ref 0.0–0.2)

## 2022-06-17 LAB — RESP PANEL BY RT-PCR (FLU A&B, COVID) ARPGX2
Influenza A by PCR: NEGATIVE
Influenza B by PCR: NEGATIVE
SARS Coronavirus 2 by RT PCR: NEGATIVE

## 2022-06-17 LAB — BASIC METABOLIC PANEL
Anion gap: 7 (ref 5–15)
BUN: 12 mg/dL (ref 8–23)
CO2: 26 mmol/L (ref 22–32)
Calcium: 8.7 mg/dL — ABNORMAL LOW (ref 8.9–10.3)
Chloride: 86 mmol/L — ABNORMAL LOW (ref 98–111)
Creatinine, Ser: 0.69 mg/dL (ref 0.61–1.24)
GFR, Estimated: >60 mL/min (ref >60)
Glucose, Bld: 119 mg/dL — ABNORMAL HIGH (ref 70–99)
Potassium: 4.9 mmol/L (ref 3.5–5.1)
Sodium: 119 mmol/L — CL (ref 135–145)

## 2022-06-17 LAB — BRAIN NATRIURETIC PEPTIDE: B Natriuretic Peptide: 169.6 pg/mL — ABNORMAL HIGH (ref 0.0–100.0)

## 2022-06-17 LAB — OSMOLALITY: Osmolality: 261 mOsm/kg — ABNORMAL LOW (ref 275–295)

## 2022-06-17 MED ORDER — ATENOLOL 25 MG PO TABS
25.0000 mg | ORAL_TABLET | Freq: Two times a day (BID) | ORAL | Status: DC
  Administered 2022-06-18 – 2022-06-20 (×5): 25 mg via ORAL
  Filled 2022-06-17 (×6): qty 1

## 2022-06-17 MED ORDER — LISINOPRIL 10 MG PO TABS
10.0000 mg | ORAL_TABLET | Freq: Every day | ORAL | Status: DC
  Administered 2022-06-18 – 2022-06-20 (×3): 10 mg via ORAL
  Filled 2022-06-17 (×3): qty 1

## 2022-06-17 MED ORDER — FLECAINIDE ACETATE 100 MG PO TABS
100.0000 mg | ORAL_TABLET | Freq: Two times a day (BID) | ORAL | Status: DC
  Administered 2022-06-18 – 2022-06-20 (×6): 100 mg via ORAL
  Filled 2022-06-17 (×7): qty 1

## 2022-06-17 MED ORDER — FUROSEMIDE 20 MG PO TABS
20.0000 mg | ORAL_TABLET | Freq: Two times a day (BID) | ORAL | Status: DC
  Administered 2022-06-18 – 2022-06-20 (×3): 20 mg via ORAL
  Filled 2022-06-17 (×3): qty 1

## 2022-06-17 MED ORDER — ACETAMINOPHEN 650 MG RE SUPP: 650.0000 mg | Freq: Four times a day (QID) | RECTAL | Status: DC | PRN

## 2022-06-17 MED ORDER — ADULT MULTIVITAMIN W/MINERALS CH
1.0000 | ORAL_TABLET | Freq: Every day | ORAL | Status: DC
  Administered 2022-06-19 – 2022-06-20 (×2): 1 via ORAL
  Filled 2022-06-17 (×2): qty 1

## 2022-06-17 MED ORDER — UREA 15 G PO PACK
15.0000 g | PACK | Freq: Two times a day (BID) | ORAL | Status: DC
  Administered 2022-06-18 – 2022-06-20 (×6): 15 g via ORAL
  Filled 2022-06-17 (×6): qty 1

## 2022-06-17 MED ORDER — CARVEDILOL 6.25 MG PO TABS
6.2500 mg | ORAL_TABLET | Freq: Two times a day (BID) | ORAL | Status: DC
  Administered 2022-06-18 – 2022-06-20 (×5): 6.25 mg via ORAL
  Filled 2022-06-17 (×5): qty 1

## 2022-06-17 MED ORDER — GUAIFENESIN ER 600 MG PO TB12
600.0000 mg | ORAL_TABLET | Freq: Two times a day (BID) | ORAL | Status: DC
  Administered 2022-06-18 – 2022-06-20 (×6): 600 mg via ORAL
  Filled 2022-06-17 (×6): qty 1

## 2022-06-17 MED ORDER — ALBUTEROL SULFATE (2.5 MG/3ML) 0.083% IN NEBU: 2.5000 mg | INHALATION_SOLUTION | Freq: Four times a day (QID) | RESPIRATORY_TRACT | Status: DC | PRN

## 2022-06-17 MED ORDER — AMLODIPINE BESYLATE 5 MG PO TABS
5.0000 mg | ORAL_TABLET | Freq: Every day | ORAL | Status: DC
  Administered 2022-06-19 – 2022-06-20 (×2): 5 mg via ORAL
  Filled 2022-06-17 (×3): qty 1

## 2022-06-17 MED ORDER — BENZONATATE 100 MG PO CAPS
100.0000 mg | ORAL_CAPSULE | Freq: Three times a day (TID) | ORAL | Status: DC
  Administered 2022-06-18 – 2022-06-20 (×8): 100 mg via ORAL
  Filled 2022-06-17 (×8): qty 1

## 2022-06-17 MED ORDER — APIXABAN 5 MG PO TABS
5.0000 mg | ORAL_TABLET | Freq: Two times a day (BID) | ORAL | Status: DC
  Administered 2022-06-18 – 2022-06-20 (×6): 5 mg via ORAL
  Filled 2022-06-17 (×6): qty 1

## 2022-06-17 MED ORDER — ACETAMINOPHEN 325 MG PO TABS: 650.0000 mg | ORAL_TABLET | Freq: Four times a day (QID) | ORAL | Status: DC | PRN

## 2022-06-17 MED ORDER — HYOSCYAMINE SULFATE ER 0.375 MG PO TB12
0.3750 mg | ORAL_TABLET | Freq: Two times a day (BID) | ORAL | Status: DC
  Administered 2022-06-18 – 2022-06-20 (×6): 0.375 mg via ORAL
  Filled 2022-06-17 (×6): qty 1

## 2022-06-17 MED ORDER — MONTELUKAST SODIUM 10 MG PO TABS
10.0000 mg | ORAL_TABLET | Freq: Every day | ORAL | Status: DC
  Administered 2022-06-18 – 2022-06-19 (×3): 10 mg via ORAL
  Filled 2022-06-17 (×3): qty 1

## 2022-06-17 MED ORDER — POLYETHYLENE GLYCOL 3350 17 G PO PACK: 17.0000 g | PACK | Freq: Every day | ORAL | Status: DC | PRN

## 2022-06-17 NOTE — ED Triage Notes (Signed)
Pt comes over from [facility] due to wheezing with cough and burning feeling in his lungs.

## 2022-06-17 NOTE — Assessment & Plan Note (Signed)
-   Restart home anticoagulation - Restart home atenolol and carvedilol (reviewed cardiology notes, patient is meant to be on 2 beta-blockers) - Restart home flecainide

## 2022-06-17 NOTE — Assessment & Plan Note (Signed)
Patient presenting with several day history of productive cough, congestion, and occasional shortness of breath that is mild.  He denies any fever, rhinorrhea.  Chest x-ray negative.  Most consistent with acute bronchitis, likely viral.  COVID-19 and influenza negative.  - RVP panel pending - Symptomatic management with Mucinex and Tessalon Perles

## 2022-06-17 NOTE — Assessment & Plan Note (Signed)
-   Restart home antihypertensives 

## 2022-06-17 NOTE — ED Provider Triage Note (Signed)
  Emergency Medicine Provider Triage Evaluation Note  Carl [name] , a 71 y.o.male,  was evaluated in triage.  Pt complains of cough, congestion, and shortness of breath x1 week.  He was referred here by his primary care provider.   Review of Systems  Positive: Cough, congestion, shortness of breath Negative: Denies fever, chest pain, vomiting  Physical Exam   Vitals:   06/17/22 1709 06/17/22 1711  BP:  (!) 142/75  Pulse: 87   Resp: 20   Temp: 99.2 F (37.3 C)   SpO2: 97%    Gen:   Awake, no distress   Resp:  Normal effort  MSK:   Moves extremities without difficulty  Other:    Medical Decision Making  Given the patient's initial medical screening exam, the following diagnostic evaluation has been ordered. The patient will be placed in the appropriate treatment space, once one is available, to complete the evaluation and treatment. I have discussed the plan of care with the patient and I have advised the patient that an ED physician or mid-level practitioner will reevaluate their condition after the test results have been received, as the results may give them additional insight into the type of treatment they may need.    Diagnostics: Labs, respiratory panel, CXR, EKG  Treatments: none immediately   Teodoro Spray, [place] 06/17/22 1751

## 2022-06-17 NOTE — H&P (Signed)
History and Physical    Patient: Carl [name] GUR:[id number] DOB: [birth year] DOA: 06/17/2022 DOS: the patient was seen and examined on 06/17/2022 PCP: Gracelyn Nurse, MD  Patient coming from: Home  Chief Complaint:  Chief Complaint  Patient presents with   Shortness of Breath   HPI: Carl [name] is a 71 y.o. male with medical history significant of asthma, atrial fibrillation on AC, hypertension, psychogenic polydipsia, Asperger syndrome who presents to the ED with productive cough.  Carl [name] states he has been experiencing productive cough with congestion and intermittent shortness of breath for the last several days.  His sputum is clear.  He denies any fevers, chills, nausea, vomiting, diarrhea.  He endorses some shortness of breath but states it is not occurring currently.  Carl [name] states that he has been drinking more water than he should.  He states that multiple of his cardiac medications make him urinate frequently, and due to this he feels as though he is getting dehydrated and will overcompensate.  He is not currently taking his salt tablets as they were discontinued by his PCP due to lower extremity swelling.  He denies any dizziness, muscle cramps, headache, or brain fogginess.  ED course: On arrival to the ED, patient was afebrile at 99.2 with blood pressure 142/75 and heart rate of 87.  He was saturating at 97% on room air.Initial work-up remarkable for sodium of 119.  Chest x-ray was obtained for evaluation of pneumonia; no acute cardiopulmonary abnormalities noted.  COVID-19 PCR and influenza negative.  Due to severity of and difficulty managing hyponatremia at home, [facility] contacted for admission.  Review of Systems: As mentioned in the history of present illness. All other systems reviewed and are negative. Past Medical History:  Diagnosis Date   Anemia    Asperger's syndrome    Asthma    Atrial fibrillation (HCC)    Diverticulosis    GERD  (gastroesophageal reflux disease)    History of MRSA infection    Hypertension    Hyponatremia    Psoriasis    Valvular heart disease    Past Surgical History:  Procedure Laterality Date   CARDIOVERSION N/A 02/27/2022   Procedure: CARDIOVERSION;  Surgeon: Lamar Blinks, MD;  Location: [facility] ORS;  Service: Cardiovascular;  Laterality: N/A;   COLONOSCOPY     COLONOSCOPY WITH PROPOFOL N/A 04/23/2018   Procedure: COLONOSCOPY WITH PROPOFOL;  Surgeon: Christena Deem, MD;  Location: [facility] ENDOSCOPY;  Service: Endoscopy;  Laterality: N/A;   Social History:  reports that he has never smoked. He has never used smokeless tobacco. He reports that he does not currently use alcohol. He reports that he does not use drugs.  No Known Allergies  No family history on file.  Prior to Admission medications   Medication Sig Start Date End Date Taking? Authorizing Provider  amLODipine (NORVASC) 5 MG tablet Take 5 mg by mouth daily.   Yes [provider]  Apixaban (ELIQUIS PO) Take 5 mg by mouth 2 (two) times daily.   Yes [provider]  ascorbic acid (VITAMIN C) 1000 MG tablet Take 1,000 mg by mouth daily.   Yes [provider]  atenolol (TENORMIN) 25 MG tablet Take 25 mg by mouth 2 (two) times daily.   Yes [provider]  carvedilol (COREG) 6.25 MG tablet Take 1 tablet (6.25 mg total) by mouth 2 (two) times daily with a meal. 10/15/21  Yes Enedina Finner, MD  cetirizine (ZYRTEC) 10 MG tablet Take  10 mg by mouth daily.   Yes [provider]  flecainide (TAMBOCOR) 100 MG tablet Take 100 mg by mouth 2 (two) times daily.   Yes [provider]  folic acid (FOLVITE) 1 MG tablet Take 1 mg by mouth daily.   Yes [provider]  furosemide (LASIX) 20 MG tablet Take 1 tablet (20 mg total) by mouth daily. Patient taking differently: Take 20 mg by mouth 2 (two) times daily. 01/04/22 01/04/23 Yes Arnaldo Natal, MD  Garlic 100 MG TABS Take 100 mg by mouth  daily.   Yes [provider]  hyoscyamine (LEVBID) 0.375 MG 12 hr tablet Take 1 tablet by mouth in the morning and at bedtime. 07/26/20  Yes [provider]  lisinopril (PRINIVIL,ZESTRIL) 10 MG tablet Take 10 mg by mouth daily.   Yes [provider]  montelukast (SINGULAIR) 10 MG tablet Take 10 mg by mouth at bedtime.   Yes [provider]  Multiple Vitamin (MULTIVITAMIN) tablet Take 1 tablet by mouth daily.   Yes [provider]  Omega-3 Fatty Acids (SALMON OIL PO) Take 1 capsule by mouth daily.   Yes [provider]  pantoprazole (PROTONIX) 40 MG tablet Take 40 mg by mouth daily.   Yes [provider]  Turmeric (QC TUMERIC COMPLEX PO) Take 1 tablet by mouth daily.   Yes [provider]  Zinc Citrate-Phytase 25-500 MG CAPS Take 1 capsule by mouth daily.   Yes [provider]  albuterol (VENTOLIN HFA) 108 (90 Base) MCG/ACT inhaler Inhale 2 puffs into the lungs every 6 (six) hours as needed for wheezing. 11/18/18   [provider]  B Complex Vitamins (B COMPLEX PO) Take 1 tablet by mouth daily.    [provider]  fluticasone (FLOVENT HFA) 110 MCG/ACT inhaler Inhale 1 puff into the lungs in the morning and at bedtime. 12/06/20 12/06/21  [provider]  polyethylene glycol (MIRALAX / GLYCOLAX) 17 g packet Take 17 g by mouth daily. Patient not taking: Reported on 06/17/2022 10/15/21   Enedina Finner, MD  senna-docusate (SENOKOT-S) 8.6-50 MG tablet Take 1 tablet by mouth 2 (two) times daily. Patient not taking: Reported on 06/17/2022 10/15/21   Enedina Finner, MD  sodium chloride 1 g tablet Take 1 tablet (1 g total) by mouth 2 (two) times daily with a meal. Patient not taking: Reported on 06/17/2022 10/15/21   Enedina Finner, MD    Physical Exam: Vitals:   06/17/22 1711 06/17/22 1852 06/17/22 2040 06/17/22 2100  BP: (!) 142/75 (!) 143/82  (!) 144/72  Pulse:  61 71 72  Resp:  18  18  Temp:  98.8 F (37.1 C)   98.6 F (37 C)  TempSrc:  Oral    SpO2:  98% 95% 98%  Weight: 128.8 kg      Physical Exam Vitals and nursing note reviewed.  Constitutional:      General: He is not in acute distress.    Appearance: He is obese. He is not toxic-appearing.  HENT:     Head: Normocephalic and atraumatic.     Mouth/Throat:     Mouth: Mucous membranes are moist.     Pharynx: Oropharynx is clear.  Eyes:     Extraocular Movements: Extraocular movements intact.     Pupils: Pupils are equal, round, and reactive to light.  Cardiovascular:     Rate and Rhythm: Normal rate. Rhythm irregular.     Heart sounds: No murmur heard. Pulmonary:  Effort: Pulmonary effort is normal. No tachypnea, accessory muscle usage or respiratory distress.     Breath sounds: Normal breath sounds. No decreased breath sounds, wheezing, rhonchi or rales.  Abdominal:     General: Bowel sounds are normal.     Palpations: Abdomen is soft.  Musculoskeletal:     Right lower leg: 2+ Pitting Edema present.     Left lower leg: 2+ Pitting Edema present.  Skin:    General: Skin is warm and dry.     Comments: Significant hyperpigmentation of bilateral lower extremities with woody appearance.  Neurological:     General: No focal deficit present.     Mental Status: He is alert and oriented to person, place, and time.     Sensory: No sensory deficit.     Motor: No weakness.     Gait: Gait normal.  Psychiatric:        Mood and Affect: Mood normal.        Behavior: Behavior normal.    Data Reviewed: CBC with hemoglobin of 11.2, MCV of 79.8.  BMP with sodium of 119, chloride of 86, glucose of 119, calcium of 8.7.  BNP of 169.  COVID-19 and influenza viral panel negative.  EKG personally reviewed.  Sinus rhythm with rate of 66.  Borderline PR prolongation.  DG Chest 2 View  Result Date: 06/17/2022 CLINICAL DATA:  Cough and wheezing. Pneumonia suspected. EXAM: CHEST - 2 VIEW COMPARISON:  01/04/2022 FINDINGS: Patient's chin again  obscures the apices. There is no evidence of focal airspace disease. Stable heart size and mediastinal contours. No pulmonary edema. No pleural effusion or pneumothorax. Thoracic spondylosis. IMPRESSION: No acute chest findings, particularly no evidence of pneumonia. Electronically Signed   By: Narda Rutherford M.D.   On: 06/17/2022 17:50    Results are pending, will review when available.  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 He followed up with outpatient Nephrology in March 2023; 1.5 L fluid restriction and salt tablets were recommended. It appears PCP discontinued salt tablets in June 2023 due to lower extremity edema with titration of Lasix to BID.    Today, patient is presenting with a sodium of 119. Previously 122 on 10/30, 119 on 10/27,   He is asymptomatic and euvolemic with the exemption of lower extremity edema in the setting of venous stasis. Although hyponatremia within the severe range, this is a chronic problem so will hold off 3% saline at this time.  Will slowly correct with goal of 125 maximum the next 24 hours.  - BMP every 6 hours - Fluid restriction of 1.2 L daily - Restart home Lasix - Given inability to tolerate salt tablets, will try urea packets - Continue outpatient follow-up with nephrology  Acute bronchitis Patient presenting with several day history of productive cough, congestion, and occasional shortness of breath that is mild.  He denies any fever, rhinorrhea.  Chest x-ray negative.  Most consistent with acute bronchitis, likely viral.  COVID-19 and influenza negative.  - RVP panel pending - Symptomatic management with Mucinex and Tessalon Perles  Chronic venous insufficiency Patient presenting with prolonged history of bilateral lower extremity edema currently being treated with Lasix.  On examination, he has significant hyperpigmentation of the lower extremities and woody  appearance consistent with chronic venous insufficiency.  - TED hose ordered  Atrial fibrillation (HCC) - Restart home anticoagulation - Restart home atenolol and carvedilol (reviewed cardiology notes, patient is meant to be  on 2 beta-blockers) - Restart home flecainide  Hypertension - Restart home antihypertensives  Type 2 diabetes mellitus without complication, without long-term current use of insulin ([place]) Managed with diet only.  No indication for SSI   Advance Care Planning:   Code Status: Full Code   Consults: None  Family Communication: No family at bedside to update  Severity of Illness: The appropriate patient status for this patient is OBSERVATION. Observation status is judged to be reasonable and necessary in order to provide the required intensity of service to ensure the patient's safety. The patient's presenting symptoms, physical exam findings, and initial radiographic and laboratory data in the context of their medical condition is felt to place them at decreased risk for further clinical deterioration. Furthermore, it is anticipated that the patient will be medically stable for discharge from the hospital within 2 midnights of admission.   Author: Jose Persia, MD 06/17/2022 11:38 PM  For on call review [email or web address].

## 2022-06-17 NOTE — Assessment & Plan Note (Addendum)
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 He followed up with outpatient Nephrology in March 2023; 1.5 L fluid restriction and salt tablets were recommended. It appears PCP discontinued salt tablets in June 2023 due to lower extremity edema with titration of Lasix to BID.    Today, patient is presenting with a sodium of 119. Previously 122 on 10/30, 119 on 10/27,   He is asymptomatic and euvolemic with the exemption of lower extremity edema in the setting of venous stasis. Although hyponatremia within the severe range, this is a chronic problem so will hold off 3% saline at this time.  Will slowly correct with goal of 125 maximum the next 24 hours.  - BMP every 6 hours - Fluid restriction of 1.2 L daily - Restart home Lasix - Given inability to tolerate salt tablets, will try urea packets - Continue outpatient follow-up with nephrology

## 2022-06-17 NOTE — Assessment & Plan Note (Signed)
Managed with diet only.  No indication for SSI

## 2022-06-17 NOTE — ED Notes (Signed)
First Nurse Note: Pt to ED via [facility]. Pt being seen for shortness of breath and edema.

## 2022-06-17 NOTE — Assessment & Plan Note (Signed)
Patient presenting with prolonged history of bilateral lower extremity edema currently being treated with Lasix.  On examination, he has significant hyperpigmentation of the lower extremities and woody appearance consistent with chronic venous insufficiency.  - TED hose ordered

## 2022-06-17 NOTE — ED Provider Notes (Signed)
[facility] Provider Note    Event Date/Time   First MD Initiated Contact with Patient 06/17/22 1937     (approximate)   History   Shortness of Breath   HPI  Carl [name] is a 71 y.o. male past medical history of A-fib, hypertension, GERD, asthma, anemia who presents with cough and shortness of breath.  Symptoms have been going on for several days.  Patient endorses nasal congestion and frequent coughing.  Cough is productive of clear sputum.  Denies fevers or chills.  Does feel short of breath.  Says he has been eating and drinking normally.  When asked if he has been drinking a lot of water he says probably more than he should.  Says that he goes to the bathroom and then feels very dry and then drinks water.  Patient has a history of psychogenic polydipsia.  Denies GI symptoms.  Denies headache or confusion or falls.    Past Medical History:  Diagnosis Date   Anemia    Asperger's syndrome    Asthma    Atrial fibrillation (HCC)    Diverticulosis    GERD (gastroesophageal reflux disease)    History of MRSA infection    Hypertension    Hyponatremia    Psoriasis    Valvular heart disease     Patient Active Problem List   Diagnosis Date Noted   GERD (gastroesophageal reflux disease)    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   Acute hyponatremia 04/28/2021   Constipation 04/28/2021   Atrial fibrillation ([place])    Asperger's syndrome    Hyponatremia    Hypertension    Asthma    Postural dizziness with presyncope    Type 2 diabetes mellitus without complication, without long-term current use of insulin ([place]) 06/23/2018     Physical Exam  Triage Vital Signs: ED Triage Vitals  Enc Vitals Group     BP 06/17/22 1711 (!) 142/75     Pulse Rate 06/17/22 1709 87     Resp 06/17/22 1709 20     Temp 06/17/22 1709 99.2 F (37.3 C)     Temp Source 06/17/22 1709 Oral     SpO2 06/17/22 1709 97 %     Weight 06/17/22 1711 284 lb (128.8 kg)     Height  --      Head Circumference --      Peak Flow --      Pain Score 06/17/22 1709 3     Pain Loc --      Pain Edu? --      Excl. in [place]? --     Most recent vital signs: Vitals:   06/17/22 1711 06/17/22 1852  BP: (!) 142/75 (!) 143/82  Pulse:  61  Resp:  18  Temp:  98.8 F (37.1 C)  SpO2:  98%     General: Awake, no distress.  CV:  Good peripheral perfusion.  Significant pitting edema bilateral lower extremities Resp:  Normal effort.  Patient does have frequent cough but lungs sound clear he is not in respiratory distress Abd:  No distention. Neuro:             Awake, Alert, Oriented x 3  Other:  Patient repeats himself frequently   ED Results / Procedures / Treatments  Labs (all labs ordered are listed, but only abnormal results are displayed) Labs Reviewed  BASIC METABOLIC PANEL - Abnormal; Notable for the following components:      Result Value   Sodium  119 (*)    Chloride 86 (*)    Glucose, Bld 119 (*)    Calcium 8.7 (*)    All other components within normal limits  CBC WITH DIFFERENTIAL/PLATELET - Abnormal; Notable for the following components:   RBC 4.11 (*)    Hemoglobin 11.2 (*)    HCT 32.8 (*)    MCV 79.8 (*)    Lymphs Abs 0.5 (*)    Monocytes Absolute 1.1 (*)    All other components within normal limits  BRAIN NATRIURETIC PEPTIDE - Abnormal; Notable for the following components:   B Natriuretic Peptide 169.6 (*)    All other components within normal limits  RESP PANEL BY RT-PCR (FLU A&B, COVID) ARPGX2  SODIUM, URINE, RANDOM  OSMOLALITY, URINE  OSMOLALITY     EKG  EKG interpretation performed by myself: NSR, nml axis, nml intervals, no acute ischemic changes    RADIOLOGY I reviewed and interpreted the CXR which does not show any acute cardiopulmonary process    PROCEDURES:  Critical Care performed: Yes, see critical care procedure note(s)  .Critical Care  Performed by: Georga Hacking, MD Authorized by: Georga Hacking, MD    Critical care provider statement:    Critical care time (minutes):  30   Critical care was time spent personally by me on the following activities:  Development of treatment plan with patient or surrogate, discussions with consultants, evaluation of patient's response to treatment, examination of patient, ordering and review of laboratory studies, ordering and review of radiographic studies, ordering and performing treatments and interventions, pulse oximetry, re-evaluation of patient's condition and review of old charts   The patient is on the cardiac monitor to evaluate for evidence of arrhythmia and/or significant heart rate changes.   MEDICATIONS ORDERED IN ED: Medications - No data to display   IMPRESSION / MDM / ASSESSMENT AND PLAN / ED COURSE  I reviewed the triage vital signs and the nursing notes.                              Patient's presentation is most consistent with acute presentation with potential threat to life or bodily function.  Differential diagnosis includes, but is not limited to, viral illness, bronchitis, pneumonia, hypervolemic hyponatremia, psychogenic polydipsia  Patient is a 71-year-old male with history of Asperger's and history of hyponatremia in the past that was due to psychogenic polydipsia presenting today primarily with cough and shortness of breath.  He presents because of cough and burning feeling in the lungs.  He is not hypoxic chest x-ray is clear does have frequent cough suspect bronchitis.  Number he is significantly hyponatremic sodium is 119.  Looking back at his records from his primary doctor he has had chronic hyponatremia and recently has been 119 and 122.  He had an admission last year for severe hyponatremia with sodium as low as 109 and required hypertonic saline this was thought to be due to polydipsia.  Patient seems to be with normal mental status of that he does repeat himself sometimes unclear if this is due to his underlying Asperger's  or could be component of symptomatic hyponatremia.  I asked the patient if he has been taking salt pills he says he is not and he thinks he does drink more than he should.  We will send urine awesome's and serum osmolality however I suspect that this is likely due to psychogenic polydipsia again.  With the sodium  being less than 120 I feel that he should be admitted to ensure that it is rising.  I have discussed with the hospitalist.       FINAL CLINICAL IMPRESSION(S) / ED DIAGNOSES   Final diagnoses:  Hyponatremia     Rx / DC Orders   ED Discharge Orders     None        Note:  This document was prepared using Dragon voice recognition software and may include unintentional dictation errors.   Georga Hacking, MD 06/17/22 2047

## 2022-06-18 ENCOUNTER — Encounter: Payer: Self-pay | Admitting: Internal Medicine

## 2022-06-18 ENCOUNTER — Other Ambulatory Visit: Payer: Self-pay

## 2022-06-18 DIAGNOSIS — L8990 Pressure ulcer of unspecified site, unspecified stage: Secondary | ICD-10-CM | POA: Insufficient documentation

## 2022-06-18 DIAGNOSIS — E871 Hypo-osmolality and hyponatremia: Secondary | ICD-10-CM | POA: Diagnosis not present

## 2022-06-18 LAB — RESPIRATORY PANEL BY PCR

## 2022-06-18 LAB — BASIC METABOLIC PANEL
Anion gap: 6 (ref 5–15)
Anion gap: 6 (ref 5–15)
Anion gap: 9 (ref 5–15)
BUN: 16 mg/dL (ref 8–23)
BUN: 22 mg/dL (ref 8–23)
BUN: 18 mg/dL (ref 8–23)
CO2: 27 mmol/L (ref 22–32)
CO2: 26 mmol/L (ref 22–32)
CO2: 27 mmol/L (ref 22–32)
Calcium: 8.6 mg/dL — ABNORMAL LOW (ref 8.9–10.3)
Calcium: 8.4 mg/dL — ABNORMAL LOW (ref 8.9–10.3)
Calcium: 8.4 mg/dL — ABNORMAL LOW (ref 8.9–10.3)
Chloride: 91 mmol/L — ABNORMAL LOW (ref 98–111)
Chloride: 92 mmol/L — ABNORMAL LOW (ref 98–111)
Chloride: 88 mmol/L — ABNORMAL LOW (ref 98–111)
Creatinine, Ser: 0.69 mg/dL (ref 0.61–1.24)
Creatinine, Ser: 0.73 mg/dL (ref 0.61–1.24)
Creatinine, Ser: 0.61 mg/dL (ref 0.61–1.24)
GFR, Estimated: >60 mL/min (ref >60)
GFR, Estimated: >60 mL/min (ref >60)
GFR, Estimated: >60 mL/min (ref >60)
Glucose, Bld: 124 mg/dL — ABNORMAL HIGH (ref 70–99)
Glucose, Bld: 150 mg/dL — ABNORMAL HIGH (ref 70–99)
Glucose, Bld: 121 mg/dL — ABNORMAL HIGH (ref 70–99)
Potassium: 4.5 mmol/L (ref 3.5–5.1)
Potassium: 4.1 mmol/L (ref 3.5–5.1)
Potassium: 4.7 mmol/L (ref 3.5–5.1)
Sodium: 124 mmol/L — ABNORMAL LOW (ref 135–145)
Sodium: 124 mmol/L — ABNORMAL LOW (ref 135–145)
Sodium: 124 mmol/L — ABNORMAL LOW (ref 135–145)

## 2022-06-18 LAB — MAGNESIUM: Magnesium: 2.1 mg/dL (ref 1.7–2.4)

## 2022-06-18 LAB — SODIUM, URINE, RANDOM: Sodium, Ur: 61 mmol/L

## 2022-06-18 LAB — HIV ANTIBODY (ROUTINE TESTING W REFLEX): HIV Screen 4th Generation wRfx: NONREACTIVE

## 2022-06-18 LAB — OSMOLALITY, URINE: Osmolality, Ur: 333 mOsm/kg (ref 300–900)

## 2022-06-18 MED ORDER — PANTOPRAZOLE SODIUM 40 MG PO TBEC
40.0000 mg | DELAYED_RELEASE_TABLET | Freq: Every day | ORAL | Status: DC
  Administered 2022-06-18 – 2022-06-20 (×3): 40 mg via ORAL
  Filled 2022-06-18 (×3): qty 1

## 2022-06-18 MED ORDER — FOLIC ACID 1 MG PO TABS
1.0000 mg | ORAL_TABLET | Freq: Every day | ORAL | Status: DC
  Administered 2022-06-18 – 2022-06-20 (×3): 1 mg via ORAL
  Filled 2022-06-18 (×3): qty 1

## 2022-06-18 NOTE — ED Notes (Signed)
Pt's BP 125/62, and HR 57, this RN held pt's atenolol, and amlodipine, and gave lisinopril. Notified Dr. Georgeann Oppenheim, and asked if he would still like flecainide given. Will continue to monitor.

## 2022-06-18 NOTE — Evaluation (Signed)
Occupational Therapy Evaluation Patient Details Name: Carl [name] MRN: [id number] DOB: June 08, 1951 Today's Date: 06/18/2022   History of Present Illness Pt is a 71 yo male that presented to ED for SOB, cough, noted for hyponatremia, up to 124. PMH of aspergers syndrome, asthma, afib, GERD, HTN, psychogenic polydipsia.   Clinical Impression   Pt completed SLUMS examination this date scoring 14/30 indicating a *positive screen for dementia. Of note, it is not within occupational therapy scope of practice to diagnose cognitive impairments, this screen indicates need for further testing. The SLUMS is a 30 point, 11 question screening questionnaire that tests orientation, memory, attention, and executive function. Pt with noted impairments in short term memory, problem solving, and executive function limiting ability to complete IADLs efficiently and safely.During session, patient observed to be slow process, requiring increased time to analyze and answer questions. Tangential speech throughout session, requiring max redirection. At baseline, patient reports he is independent with all ADLs and ambulated without any AD.  Patient currently functioning at Mod I for transfer sit<>stand. Patient left in recliner with call bell in reach, bed alarm set, and all needs met. No OT services recommended at this time. OT to complete order.  Based on this score, recommending intermittent supervision/assistance for safety, medication management, ADL, and other IADL tasks.      Recommendations for follow up therapy are one component of a multi-disciplinary discharge planning process, led by the attending physician.  Recommendations may be updated based on patient status, additional functional criteria and insurance authorization.   Follow Up Recommendations  No OT follow up    Assistance Recommended at Discharge Intermittent Supervision/Assistance  Patient can return home with the following Direct  supervision/assist for medications management;Assistance with cooking/housework;Direct supervision/assist for financial management;Assist for transportation    Functional Status Assessment  Patient has had a recent decline in their functional status and demonstrates the ability to make significant improvements in function in a reasonable and predictable amount of time.  Equipment Recommendations  None recommended by OT       Precautions / Restrictions Precautions Precautions: Fall Restrictions Weight Bearing Restrictions: No      Mobility Bed Mobility               General bed mobility comments: pt up in recliner at start/end of session    Transfers Overall transfer level: Modified independent Equipment used: None                      Balance Overall balance assessment: Needs assistance Sitting-balance support: Feet supported Sitting balance-Leahy Scale: Good Sitting balance - Comments: definite use of hands to transfer   Standing balance support: No upper extremity supported Standing balance-Leahy Scale: Good                             ADL either performed or assessed with clinical judgement   ADL Overall ADL's : Needs assistance/impaired                                       General ADL Comments: Per patient report he is independent with all ADLs     Vision Baseline Vision/History: 1 Wears glasses Patient Visual Report: No change from baseline              Pertinent Vitals/Pain Pain Assessment Pain Assessment: No/denies pain  Extremity/Trunk Assessment Upper Extremity Assessment Upper Extremity Assessment: Generalized weakness   Lower Extremity Assessment Lower Extremity Assessment: Generalized weakness       Communication Communication Communication: No difficulties   Cognition Arousal/Alertness: Awake/alert Behavior During Therapy: WFL for tasks assessed/performed Overall Cognitive Status:  Within Functional Limits for tasks assessed                                 General Comments: oriented but verbose                Home Living Family/patient expects to be discharged to:: Private residence Living Arrangements: Alone Available Help at Discharge: Neighbor Type of Home: House Home Access: Stairs to enter Technical brewer of Steps: 2-3 Entrance Stairs-Rails: Right;Left;Can reach both Home Layout: One level     Bathroom Shower/Tub: Teacher, early years/pre: Standard         Additional Comments: has his mothers RW, does not use. denies any falls      Prior Functioning/Environment Prior Level of Function : Needs assist             Mobility Comments: I for mobility, does not drive but neighbor will take him to the grocery store. ADLs Comments: I                 OT Goals(Current goals can be found in the care plan section) Acute Rehab OT Goals Patient Stated Goal: return home OT Goal Formulation: With patient Time For Goal Achievement: 06/18/22 Potential to Achieve Goals: Good   AM-PAC OT "6 Clicks" Daily Activity     Outcome Measure Help from another person eating meals?: None Help from another person taking care of personal grooming?: None Help from another person toileting, which includes using toliet, bedpan, or urinal?: None Help from another person bathing (including washing, rinsing, drying)?: None Help from another person to put on and taking off regular upper body clothing?: None Help from another person to put on and taking off regular lower body clothing?: A Little 6 Click Score: 23   End of Session Nurse Communication: Mobility status  Activity Tolerance: Patient tolerated treatment well Patient left: in chair;with call bell/phone within reach;with chair alarm set  OT Visit Diagnosis: Muscle weakness (generalized) (M62.81)                Time: 1440-1455 OT Time Calculation (min): 15 min Charges:   OT General Charges $OT Visit: 1 Visit OT Evaluation $OT Eval Low Complexity: 1 Low    Niang Mitcheltree, OTS 06/18/2022, 3:51 PM

## 2022-06-18 NOTE — Plan of Care (Signed)

## 2022-06-18 NOTE — Progress Notes (Signed)
PROGRESS NOTE    Carl [name]  FUX:[id number] DOB: 10-Jan-1951 DOA: 06/17/2022 PCP: Gracelyn Nurse, MD    Brief Narrative:  71 y.o. male with medical history significant of asthma, atrial fibrillation on AC, hypertension, psychogenic polydipsia, Asperger syndrome who presents to the ED with productive cough.   Carl [name] states he has been experiencing productive cough with congestion and intermittent shortness of breath for the last several days.  His sputum is clear.  He denies any fevers, chills, nausea, vomiting, diarrhea.  He endorses some shortness of breath but states it is not occurring currently.   Carl [name] states that he has been drinking more water than he should.  He states that multiple of his cardiac medications make him urinate frequently, and due to this he feels as though he is getting dehydrated and will overcompensate.  He is not currently taking his salt tablets as they were discontinued by his PCP due to lower extremity swelling.  He denies any dizziness, muscle cramps, headache, or brain fogginess.  11/8: Sodium improving.  Up to 124 as of this morning.  Discussed case with nephrology.  Recommend continued overnight observation.  Repeat sodium in the morning.  Continue fluid restriction.   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 He followed up with outpatient Nephrology in March 2023; 1.5 L fluid restriction and salt tablets were recommended. It appears PCP discontinued salt tablets in June 2023 due to lower extremity edema with titration of Lasix to BID.     Today, patient is presenting with a sodium of 119. Previously 122 on 10/30, 119  on 10/27,   He is asymptomatic and euvolemic with the exemption of lower extremity edema in the setting of venous stasis. Although hyponatremia within the severe range, this is a chronic problem so will hold off 3% saline at this time.   Plan: Serial BMPs Fluid restrict 1.2 L daily Restart home Lasix Urea packets okay for now Case discussed with nephrology  Acute bronchitis Patient presenting with several day history of productive cough, congestion, and occasional shortness of breath that is mild.  He denies any fever, rhinorrhea.  Chest x-ray negative.  Most consistent with acute bronchitis, likely viral.  COVID-19 and influenza negative.  Plan: Respiratory viral panel, pending As needed mucolytic's and antitussives   Chronic venous insufficiency Patient presenting with prolonged history of bilateral lower extremity edema currently being treated with Lasix.  On examination, he has significant hyperpigmentation of the lower extremities and woody appearance consistent with chronic venous insufficiency.  Plan: TED hose   Paroxysmal atrial fibrillation - Restart home anticoagulation - Restart home atenolol and carvedilol (reviewed cardiology notes, patient is meant to be on 2 beta-blockers) - Restart home flecainide   Hypertension - Continue home antihypertensives   Type 2 diabetes mellitus without complication, without long-term current use of insulin (HCC) Managed with diet only.  No indication for SSI   DVT prophylaxis: Apixaban Code Status: Full Family Communication: None today Disposition Plan: Status is: Observation The patient will require care spanning > 2 midnights and should be moved to inpatient because: Acute symptomatic hyponatremia   Level of care: Med-Surg  Consultants:  Nephrology  Procedures:  None  Antimicrobials: None   Subjective: Seen and examined.  Resting comfortably in bed.  No visible distress.  No complaints of pain.  Objective: Vitals:    06/18/22 1045 06/18/22 1200 06/18/22 1205 06/18/22 1300  BP:   (!) 111/54 132/62  Pulse: (!) 40 (!) 56  (!) 56  Resp: (!) 24 12 15 18   Temp:    98.9 F (37.2 C)  TempSrc:    Oral  SpO2: 92% 93%  96%  Weight:       No intake or output data in the 24 hours ending 06/18/22 1332 Filed Weights   06/17/22 1711  Weight: 128.8 kg    Examination:  General exam: Appears calm and comfortable  Respiratory system: Clear to auscultation. Respiratory effort normal. Cardiovascular system: S1-S2, regular rate, irregular rhythm, no murmurs, 2+ pitting edema BLE Gastrointestinal system: Soft, NT/ND, normal bowel sounds Central nervous system: Alert and oriented. No focal neurological deficits. Extremities: Symmetric 5 x 5 power. Skin: No rashes, lesions or ulcers Psychiatry: Judgement and insight appear normal. Mood & affect appropriate.     Data Reviewed: I have personally reviewed following labs and imaging studies  CBC: Recent Labs  Lab 06/17/22 1719  WBC 8.3  NEUTROABS 6.4  HGB 11.2*  HCT 32.8*  MCV 79.8*  PLT 238   Basic Metabolic Panel: Recent Labs  Lab 06/17/22 1719 06/18/22 0453 06/18/22 1033  NA 119* 124* 124*  K 4.9 4.5 4.1  CL 86* 91* 92*  CO2 26 27 26   GLUCOSE 119* 124* 150*  BUN 12 16 22   CREATININE 0.69 0.69 0.73  CALCIUM 8.7* 8.6* 8.4*  MG 2.1  --   --    GFR: Estimated Creatinine Clearance: 125.9 mL/min (by C-G formula based on SCr of 0.73 mg/dL). Liver Function Tests: No results for input(s): "AST", "ALT", "ALKPHOS", "BILITOT", "PROT", "ALBUMIN" in the last 168 hours. No results for input(s): "LIPASE", "AMYLASE" in the last 168 hours. No results for input(s): "AMMONIA" in the last 168 hours. Coagulation Profile: No results for input(s): "INR", "PROTIME" in the last 168 hours. Cardiac Enzymes: No results for input(s): "CKTOTAL", "CKMB", "CKMBINDEX", "TROPONINI" in the last 168 hours. BNP (last 3 results) No results for input(s): "PROBNP" in the last  8760 hours. HbA1C: No results for input(s): "HGBA1C" in the last 72 hours. CBG: No results for input(s): "GLUCAP" in the last 168 hours. Lipid Profile: No results for input(s): "CHOL", "HDL", "LDLCALC", "TRIG", "CHOLHDL", "LDLDIRECT" in the last 72 hours. Thyroid Function Tests: No results for input(s): "TSH", "T4TOTAL", "FREET4", "T3FREE", "THYROIDAB" in the last 72 hours. Anemia Panel: No results for input(s): "VITAMINB12", "FOLATE", "FERRITIN", "TIBC", "IRON", "RETICCTPCT" in the last 72 hours. Sepsis Labs: No results for input(s): "PROCALCITON", "LATICACIDVEN" in the last 168 hours.  Recent Results (from the past 240 hour(s))  Resp Panel by RT-PCR (Flu A&B, Covid) Anterior Nasal Swab     Status: None   Collection Time: 06/17/22  5:19 PM   Specimen: Anterior Nasal Swab  Result Value Ref Range Status   SARS Coronavirus 2 by RT PCR NEGATIVE NEGATIVE Final    Comment: (NOTE) SARS-CoV-2 target nucleic acids are NOT DETECTED.  The SARS-CoV-2 RNA is generally detectable in upper respiratory specimens during the acute phase of infection. The lowest concentration of SARS-CoV-2 viral copies this assay can detect is 138 copies/mL. A negative result does not preclude SARS-Cov-2 infection and should not be used as the sole basis for treatment or other patient management decisions. A negative result may occur  with  improper specimen collection/handling, submission of specimen other than nasopharyngeal swab, presence of viral mutation(s) within the areas targeted by this assay, and inadequate number of viral copies(<138 copies/mL). A negative result must be combined with clinical observations, patient history, and epidemiological information. The expected result is Negative.  Fact Sheet for Patients:  BloggerCourse.com  Fact Sheet for Healthcare Providers:  SeriousBroker.it  This test is no t yet approved or cleared by the [place]  FDA and  has been authorized for detection and/or diagnosis of SARS-CoV-2 by FDA under an Emergency Use Authorization (EUA). This EUA will remain  in effect (meaning this test can be used) for the duration of the COVID-19 declaration under Section 564(b)(1) of the Act, 21 U.S.C.section 360bbb-3(b)(1), unless the authorization is terminated  or revoked sooner.       Influenza A by PCR NEGATIVE NEGATIVE Final   Influenza B by PCR NEGATIVE NEGATIVE Final    Comment: (NOTE) The Xpert Xpress SARS-CoV-2/FLU/RSV plus assay is intended as an aid in the diagnosis of influenza from Nasopharyngeal swab specimens and should not be used as a sole basis for treatment. Nasal washings and aspirates are unacceptable for Xpert Xpress SARS-CoV-2/FLU/RSV testing.  Fact Sheet for Patients: BloggerCourse.com  Fact Sheet for Healthcare Providers: SeriousBroker.it  This test is not yet approved or cleared by the [place] FDA and has been authorized for detection and/or diagnosis of SARS-CoV-2 by FDA under an Emergency Use Authorization (EUA). This EUA will remain in effect (meaning this test can be used) for the duration of the COVID-19 declaration under Section 564(b)(1) of the Act, 21 U.S.C. section 360bbb-3(b)(1), unless the authorization is terminated or revoked.  Performed at [facility], [address], [place], [place] [address]          Radiology Studies: DG Chest 2 View  Result Date: 06/17/2022 CLINICAL DATA:  Cough and wheezing. Pneumonia suspected. EXAM: CHEST - 2 VIEW COMPARISON:  01/04/2022 FINDINGS: Patient's chin again obscures the apices. There is no evidence of focal airspace disease. Stable heart size and mediastinal contours. No pulmonary edema. No pleural effusion or pneumothorax. Thoracic spondylosis. IMPRESSION: No acute chest findings, particularly no evidence of pneumonia. Electronically Signed   By: Narda Rutherford M.D.   On: 06/17/2022 17:50        Scheduled Meds:  amLODipine  5 mg Oral Daily   apixaban  5 mg Oral BID   atenolol  25 mg Oral BID   benzonatate  100 mg Oral TID   carvedilol  6.25 mg Oral BID WC   flecainide  100 mg Oral BID   folic acid  1 mg Oral Daily   furosemide  20 mg Oral BID   guaiFENesin  600 mg Oral BID   hyoscyamine  0.375 mg Oral Q12H   lisinopril  10 mg Oral Daily   montelukast  10 mg Oral QHS   multivitamin with minerals  1 tablet Oral Daily   pantoprazole  40 mg Oral Daily   urea  15 g Oral BID   Continuous Infusions:   LOS: 0 days      Tresa Moore, MD [facility]   If 7PM-7AM, please contact night-coverage  06/18/2022, 1:32 PM

## 2022-06-18 NOTE — ED Notes (Signed)
Dr. Georgeann Oppenheim states to give pt. His flecainide, see MAR.

## 2022-06-18 NOTE — ED Notes (Signed)
PT

## 2022-06-18 NOTE — ED Notes (Signed)
Pt. Up to recliner at bedside.

## 2022-06-18 NOTE — Evaluation (Signed)
Physical Therapy Evaluation Patient Details Name: Carl [name] MRN: [id number] DOB: September 16, 1950 Today's Date: 06/18/2022  History of Present Illness  Pt is a 71 yo male that presented to ED for SOB, cough, noted for hyponatremia, up to 124. PMH of aspergers syndrome, asthma, afib, GERD, HTN, psychogenic polydipsia.   Clinical Impression  Patient alert, seated in recliner, denied pain upon PT entrance. Pt mostly distracted throughout session but cough/kyphotic posture in his chair, as well as his chair set up at home, advice offered as able. Pt reported at baseline he is independent in ADLs, and ambulation, has a neighbor that drives him to the grocery store as needed.  He was able to perform transfers modI (definite use of hands), and ambulated ~144ft modI as well. Noted for wide BOS, decreased step height/length and gait velocity, but pt endorsed feeling at his normal walking speed/mechanics. Verbose throughout session but able to re-direct and answer questions as needed. The patient demonstrated and reported return to baseline level of functioning, no further acute PT needs indicated. PT to sign off. Please reconsult PT if pt status changes or acute needs are identified.         Recommendations for follow up therapy are one component of a multi-disciplinary discharge planning process, led by the attending physician.  Recommendations may be updated based on patient status, additional functional criteria and insurance authorization.  Follow Up Recommendations No PT follow up      Assistance Recommended at Discharge PRN  Patient can return home with the following  Assist for transportation    Equipment Recommendations None recommended by PT  Recommendations for Other Services       Functional Status Assessment Patient has not had a recent decline in their functional status     Precautions / Restrictions Precautions Precautions: Fall Restrictions Weight Bearing Restrictions:  No      Mobility  Bed Mobility               General bed mobility comments: pt up in recliner at start/end of session    Transfers Overall transfer level: Modified independent                      Ambulation/Gait Ambulation/Gait assistance: Modified independent (Device/Increase time) Gait Distance (Feet): 110 Feet Assistive device: None         General Gait Details: wide BOS, decreased step height/length bilaterally, no LOB, decreased gait velocity.  Stairs            Wheelchair Mobility    Modified Rankin (Stroke Patients Only)       Balance Overall balance assessment: Needs assistance Sitting-balance support: Feet supported Sitting balance-Leahy Scale: Good Sitting balance - Comments: definite use of hands to transfer   Standing balance support: No upper extremity supported Standing balance-Leahy Scale: Good                               Pertinent Vitals/Pain Pain Assessment Pain Assessment: No/denies pain    Home Living Family/patient expects to be discharged to:: Private residence Living Arrangements: Alone Available Help at Discharge: Neighbor Type of Home: House Home Access: Stairs to enter Entrance Stairs-Rails: Right;Left;Can reach both Entrance Stairs-Number of Steps: 2-3   Home Layout: One level   Additional Comments: has his mothers RW, does not use. denies any falls    Prior Function Prior Level of Function : Needs assist  Mobility Comments: I for mobility, does not drive but neighbor will take him to the grocery store. ADLs Comments: I     Hand Dominance        Extremity/Trunk Assessment   Upper Extremity Assessment Upper Extremity Assessment: Generalized weakness    Lower Extremity Assessment Lower Extremity Assessment: Generalized weakness    Cervical / Trunk Assessment Cervical / Trunk Assessment: Kyphotic  Communication   Communication: No difficulties  Cognition  Arousal/Alertness: Awake/alert Behavior During Therapy: WFL for tasks assessed/performed Overall Cognitive Status: Within Functional Limits for tasks assessed                                 General Comments: oriented but verbose, enjoys talking about aviation        General Comments      Exercises     Assessment/Plan    PT Assessment Patient does not need any further PT services  PT Problem List         PT Treatment Interventions      PT Goals (Current goals can be found in the Care Plan section)       Frequency       Co-evaluation               AM-PAC PT "6 Clicks" Mobility  Outcome Measure Help needed turning from your back to your side while in a flat bed without using bedrails?: None Help needed moving from lying on your back to sitting on the side of a flat bed without using bedrails?: None Help needed moving to and from a bed to a chair (including a wheelchair)?: None Help needed standing up from a chair using your arms (e.g., wheelchair or bedside chair)?: None Help needed to walk in hospital room?: None Help needed climbing 3-5 steps with a railing? : None 6 Click Score: 24    End of Session   Activity Tolerance: Patient tolerated treatment well Patient left: in chair;with call bell/phone within reach Nurse Communication: Mobility status      Time: 1101-1114 PT Time Calculation (min) (ACUTE ONLY): 13 min   Charges:   PT Evaluation $PT Eval Low Complexity: 1 Low          Olga Coaster PT, DPT 11:27 AM,06/18/22

## 2022-06-18 NOTE — Progress Notes (Addendum)
[facility]  ROUNDING NOTE   Subjective:   Carl [name] is a 71 y.o. male with past medical history of atrial fib, hypertension, Asperger Syndrome, and psychogenic polydipsia. He presents to the Ed with complaints of shortness of breath. He has been admitted under observation for Hyponatremia [E87.1]  Patient is known to our practice from previous admissions for hyponatremia.  He was initially in the emergency department seeking assist with productive cough and shortness of breath that he states began more than a week ago.  Denies known fever or chills.  Tolerating meals without nausea or vomiting.  Room air.  Patient states he is maintaining all outpatient appointments with primary care physician.  Lower extremity edema present.  On ED arrival noted for sodium 119.  Patient states he has loosened his fluid restriction recently.  Other labs of note include glucose 119, calcium 8.7, BNP 169, serum osmolality 261, hemoglobin 11.2, and negative respiratory panel.  Chest x-ray shows no acute changes.  We have been consulted to assist in management of hyponatremia.   Objective:  Vital signs in last 24 hours:  Temp:  [98.4 F (36.9 C)-99.2 F (37.3 C)] 98.5 F (36.9 C) (11/08 0918) Pulse Rate:  [59-87] 59 (11/08 0918) Resp:  [14-20] 16 (11/08 0918) BP: (123-144)/(62-83) 125/62 (11/08 0918) SpO2:  [94 %-98 %] 94 % (11/08 0918) Weight:  [128.8 kg] 128.8 kg (11/07 1711)  Weight change:  Filed Weights   06/17/22 1711  Weight: 128.8 kg    Intake/Output: No intake/output data recorded.   Intake/Output this shift:  No intake/output data recorded.  Physical Exam: General: NAD  Head: Normocephalic, atraumatic. Moist oral mucosal membranes  Eyes: Anicteric  Lungs:  Clear to auscultation, normal effort, room air  Heart: Regular rate and rhythm  Abdomen:  Soft, nontender  Extremities:  1-2+ peripheral edema.  Neurologic: Nonfocal, moving all four extremities  Skin:  No lesions  Access: None    Basic Metabolic Panel: Recent Labs  Lab 06/17/22 1719 06/18/22 0453  NA 119* 124*  K 4.9 4.5  CL 86* 91*  CO2 26 27  GLUCOSE 119* 124*  BUN 12 16  CREATININE 0.69 0.69  CALCIUM 8.7* 8.6*  MG 2.1  --     Liver Function Tests: No results for input(s): "AST", "ALT", "ALKPHOS", "BILITOT", "PROT", "ALBUMIN" in the last 168 hours. No results for input(s): "LIPASE", "AMYLASE" in the last 168 hours. No results for input(s): "AMMONIA" in the last 168 hours.  CBC: Recent Labs  Lab 06/17/22 1719  WBC 8.3  NEUTROABS 6.4  HGB 11.2*  HCT 32.8*  MCV 79.8*  PLT 238    Cardiac Enzymes: No results for input(s): "CKTOTAL", "CKMB", "CKMBINDEX", "TROPONINI" in the last 168 hours.  BNP: Invalid input(s): "POCBNP"  CBG: No results for input(s): "GLUCAP" in the last 168 hours.  Microbiology: Results for orders placed or performed during the hospital encounter of 06/17/22  Resp Panel by RT-PCR (Flu A&B, Covid) Anterior Nasal Swab     Status: None   Collection Time: 06/17/22  5:19 PM   Specimen: Anterior Nasal Swab  Result Value Ref Range Status   SARS Coronavirus 2 by RT PCR NEGATIVE NEGATIVE Final    Comment: (NOTE) SARS-CoV-2 target nucleic acids are NOT DETECTED.  The SARS-CoV-2 RNA is generally detectable in upper respiratory specimens during the acute phase of infection. The lowest concentration of SARS-CoV-2 viral copies this assay can detect is 138 copies/mL. A negative result does not preclude SARS-Cov-2 infection  and should not be used as the sole basis for treatment or other patient management decisions. A negative result may occur with  improper specimen collection/handling, submission of specimen other than nasopharyngeal swab, presence of viral mutation(s) within the areas targeted by this assay, and inadequate number of viral copies(<138 copies/mL). A negative result must be combined with clinical observations, patient history, and  epidemiological information. The expected result is Negative.  Fact Sheet for Patients:  BloggerCourse.com  Fact Sheet for Healthcare Providers:  SeriousBroker.it  This test is no t yet approved or cleared by the [place] FDA and  has been authorized for detection and/or diagnosis of SARS-CoV-2 by FDA under an Emergency Use Authorization (EUA). This EUA will remain  in effect (meaning this test can be used) for the duration of the COVID-19 declaration under Section 564(b)(1) of the Act, 21 U.S.C.section 360bbb-3(b)(1), unless the authorization is terminated  or revoked sooner.       Influenza A by PCR NEGATIVE NEGATIVE Final   Influenza B by PCR NEGATIVE NEGATIVE Final    Comment: (NOTE) The Xpert Xpress SARS-CoV-2/FLU/RSV plus assay is intended as an aid in the diagnosis of influenza from Nasopharyngeal swab specimens and should not be used as a sole basis for treatment. Nasal washings and aspirates are unacceptable for Xpert Xpress SARS-CoV-2/FLU/RSV testing.  Fact Sheet for Patients: BloggerCourse.com  Fact Sheet for Healthcare Providers: SeriousBroker.it  This test is not yet approved or cleared by the [place] FDA and has been authorized for detection and/or diagnosis of SARS-CoV-2 by FDA under an Emergency Use Authorization (EUA). This EUA will remain in effect (meaning this test can be used) for the duration of the COVID-19 declaration under Section 564(b)(1) of the Act, 21 U.S.C. section 360bbb-3(b)(1), unless the authorization is terminated or revoked.  Performed at [facility], [address] [place], [place] [address]     Coagulation Studies: No results for input(s): "LABPROT", "INR" in the last 72 hours.  Urinalysis: No results for input(s): "COLORURINE", "LABSPEC", "PHURINE", "GLUCOSEU", "HGBUR", "BILIRUBINUR", "KETONESUR",  "PROTEINUR", "UROBILINOGEN", "NITRITE", "LEUKOCYTESUR" in the last 72 hours.  Invalid input(s): "APPERANCEUR"    Imaging: DG Chest 2 View  Result Date: 06/17/2022 CLINICAL DATA:  Cough and wheezing. Pneumonia suspected. EXAM: CHEST - 2 VIEW COMPARISON:  01/04/2022 FINDINGS: Patient's chin again obscures the apices. There is no evidence of focal airspace disease. Stable heart size and mediastinal contours. No pulmonary edema. No pleural effusion or pneumothorax. Thoracic spondylosis. IMPRESSION: No acute chest findings, particularly no evidence of pneumonia. Electronically Signed   By: Narda Rutherford M.D.   On: 06/17/2022 17:50     Medications:     amLODipine  5 mg Oral Daily   apixaban  5 mg Oral BID   atenolol  25 mg Oral BID   benzonatate  100 mg Oral TID   carvedilol  6.25 mg Oral BID WC   flecainide  100 mg Oral BID   furosemide  20 mg Oral BID   guaiFENesin  600 mg Oral BID   hyoscyamine  0.375 mg Oral Q12H   lisinopril  10 mg Oral Daily   montelukast  10 mg Oral QHS   multivitamin with minerals  1 tablet Oral Daily   urea  15 g Oral BID   acetaminophen **OR** acetaminophen, albuterol, polyethylene glycol  Assessment/ Plan:  [name] is a 71 y.o.  male with past medical history of atrial fib, hypertension, Asperger Syndrome, and psychogenic polydipsia. He presents to  the Ed with complaints of shortness of breath. He has been admitted under observation for Hyponatremia [E87.1]   Hyponatremia likely due to fluid overload. known history of polydipsia. Discharged previously on salt tabs that were stopped in June due to lower extremity edema. Sodium on ED 119. Lower extremity edema present, primary team avoided 3% hypertonic.  Current sodium 124.  Agree with Furosemide, fluid restriction and Urea packets.  Goal sodium 128-130 prior to considering discharge.  2.  Acute bronchitis .  Primary team has ordered expanded respiratory viral panel.  Supportive care.    LOS: 0 Carl [name] 11/8/20239:38 AM   Patient was examined and evaluated with Carl Beavers, NP.  Plan of care was formulated and discussed with patient as well as NP.  I agree with the note as documented with edits.

## 2022-06-19 DIAGNOSIS — I10 Essential (primary) hypertension: Secondary | ICD-10-CM | POA: Diagnosis present

## 2022-06-19 DIAGNOSIS — R0602 Shortness of breath: Secondary | ICD-10-CM | POA: Diagnosis present

## 2022-06-19 DIAGNOSIS — J208 Acute bronchitis due to other specified organisms: Secondary | ICD-10-CM | POA: Diagnosis present

## 2022-06-19 DIAGNOSIS — Z1152 Encounter for screening for COVID-19: Secondary | ICD-10-CM | POA: Diagnosis not present

## 2022-06-19 DIAGNOSIS — F845 Asperger's syndrome: Secondary | ICD-10-CM | POA: Diagnosis present

## 2022-06-19 DIAGNOSIS — E871 Hypo-osmolality and hyponatremia: Secondary | ICD-10-CM | POA: Diagnosis present

## 2022-06-19 DIAGNOSIS — Z79899 Other long term (current) drug therapy: Secondary | ICD-10-CM | POA: Diagnosis not present

## 2022-06-19 DIAGNOSIS — E860 Dehydration: Secondary | ICD-10-CM | POA: Diagnosis present

## 2022-06-19 DIAGNOSIS — E119 Type 2 diabetes mellitus without complications: Secondary | ICD-10-CM | POA: Diagnosis present

## 2022-06-19 DIAGNOSIS — L89321 Pressure ulcer of left buttock, stage 1: Secondary | ICD-10-CM | POA: Diagnosis not present

## 2022-06-19 DIAGNOSIS — E8770 Fluid overload, unspecified: Secondary | ICD-10-CM | POA: Diagnosis present

## 2022-06-19 DIAGNOSIS — K219 Gastro-esophageal reflux disease without esophagitis: Secondary | ICD-10-CM | POA: Diagnosis present

## 2022-06-19 DIAGNOSIS — I4891 Unspecified atrial fibrillation: Secondary | ICD-10-CM | POA: Diagnosis not present

## 2022-06-19 DIAGNOSIS — I480 Paroxysmal atrial fibrillation: Secondary | ICD-10-CM | POA: Diagnosis present

## 2022-06-19 DIAGNOSIS — I872 Venous insufficiency (chronic) (peripheral): Secondary | ICD-10-CM | POA: Diagnosis present

## 2022-06-19 DIAGNOSIS — I878 Other specified disorders of veins: Secondary | ICD-10-CM | POA: Diagnosis present

## 2022-06-19 DIAGNOSIS — Z7901 Long term (current) use of anticoagulants: Secondary | ICD-10-CM | POA: Diagnosis not present

## 2022-06-19 DIAGNOSIS — J45909 Unspecified asthma, uncomplicated: Secondary | ICD-10-CM | POA: Diagnosis present

## 2022-06-19 DIAGNOSIS — J209 Acute bronchitis, unspecified: Secondary | ICD-10-CM | POA: Diagnosis not present

## 2022-06-19 DIAGNOSIS — L89311 Pressure ulcer of right buttock, stage 1: Secondary | ICD-10-CM | POA: Diagnosis present

## 2022-06-19 DIAGNOSIS — Z8614 Personal history of Methicillin resistant Staphylococcus aureus infection: Secondary | ICD-10-CM | POA: Diagnosis not present

## 2022-06-19 LAB — BASIC METABOLIC PANEL
Anion gap: 7 (ref 5–15)
Anion gap: 7 (ref 5–15)
BUN: 26 mg/dL — ABNORMAL HIGH (ref 8–23)
BUN: 35 mg/dL — ABNORMAL HIGH (ref 8–23)
CO2: 28 mmol/L (ref 22–32)
CO2: 25 mmol/L (ref 22–32)
Calcium: 8.6 mg/dL — ABNORMAL LOW (ref 8.9–10.3)
Calcium: 8.6 mg/dL — ABNORMAL LOW (ref 8.9–10.3)
Chloride: 90 mmol/L — ABNORMAL LOW (ref 98–111)
Chloride: 93 mmol/L — ABNORMAL LOW (ref 98–111)
Creatinine, Ser: 0.78 mg/dL (ref 0.61–1.24)
Creatinine, Ser: 1.04 mg/dL (ref 0.61–1.24)
GFR, Estimated: >60 mL/min (ref >60)
GFR, Estimated: >60 mL/min (ref >60)
Glucose, Bld: 122 mg/dL — ABNORMAL HIGH (ref 70–99)
Glucose, Bld: 155 mg/dL — ABNORMAL HIGH (ref 70–99)
Potassium: 4.7 mmol/L (ref 3.5–5.1)
Potassium: 4.3 mmol/L (ref 3.5–5.1)
Sodium: 125 mmol/L — ABNORMAL LOW (ref 135–145)
Sodium: 125 mmol/L — ABNORMAL LOW (ref 135–145)

## 2022-06-19 LAB — HEPATIC FUNCTION PANEL
ALT: 37 U/L (ref 0–44)
AST: 29 U/L (ref 15–41)
Albumin: 3.5 g/dL (ref 3.5–5.0)
Alkaline Phosphatase: 74 U/L (ref 38–126)
Bilirubin, Direct: 0.2 mg/dL (ref 0.0–0.2)
Indirect Bilirubin: 0.9 mg/dL (ref 0.3–0.9)
Total Bilirubin: 1.1 mg/dL (ref 0.3–1.2)
Total Protein: 6.8 g/dL (ref 6.5–8.1)

## 2022-06-19 LAB — SODIUM: Sodium: 127 mmol/L — ABNORMAL LOW (ref 135–145)

## 2022-06-19 MED ORDER — TOLVAPTAN 15 MG PO TABS
15.0000 mg | ORAL_TABLET | Freq: Once | ORAL | Status: AC
  Administered 2022-06-19: 15 mg via ORAL
  Filled 2022-06-19: qty 1

## 2022-06-19 NOTE — Plan of Care (Signed)

## 2022-06-19 NOTE — Progress Notes (Signed)
PROGRESS NOTE    Carl [name]  [id number] DOB: [birth year] DOA: 06/17/2022 PCP: Baxter Hire, MD    Brief Narrative:  71 y.o. male with medical history significant of asthma, atrial fibrillation on AC, hypertension, psychogenic polydipsia, Asperger syndrome who presents to the ED with productive cough.   Carl [name] states he has been experiencing productive cough with congestion and intermittent shortness of breath for the last several days.  His sputum is clear.  He denies any fevers, chills, nausea, vomiting, diarrhea.  He endorses some shortness of breath but states it is not occurring currently.   Carl [name] states that he has been drinking more water than he should.  He states that multiple of his cardiac medications make him urinate frequently, and due to this he feels as though he is getting dehydrated and will overcompensate.  He is not currently taking his salt tablets as they were discontinued by his PCP due to lower extremity swelling.  He denies any dizziness, muscle cramps, headache, or brain fogginess.  11/8: Sodium improving.  Up to 124 as of this morning.  Discussed case with nephrology.  Recommend continued overnight observation.  Repeat sodium in the morning.  Continue fluid restriction.  11/9: Hypo Na persists.  125 x 2 checks today.  D/w nephro.  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 He followed up with outpatient Nephrology in March 2023; 1.5 L fluid restriction and salt tablets were recommended. It appears PCP discontinued salt tablets in June 2023 due to lower  extremity edema with titration of Lasix to BID.     Today, patient is presenting with a sodium of 119. Previously 122 on 10/30, 119 on 10/27,   He is asymptomatic and euvolemic with the exemption of lower extremity edema in the setting of venous stasis. Although hyponatremia within the severe range, this is a chronic problem so will hold off 3% saline at this time.    11/9: Sodium remains low at 125.  Discussed with nephrology.  Will administer 1 dose of tolvaptan. Plan: Every 8 hours sodium Fluid restrict 1.2 L daily hold home Lasix Urea packets okay for now Case discussed with nephrology If sodium above 128 discharge on 11/10  Acute bronchitis Patient presenting with several day history of productive cough, congestion, and occasional shortness of breath that is mild.  He denies any fever, rhinorrhea.  Chest x-ray negative.  Most consistent with acute bronchitis, likely viral.  COVID-19 and influenza negative.  Plan: Respiratory viral panel, negative As needed mucolytic's and antitussives   Chronic venous insufficiency Patient presenting with prolonged history of bilateral lower extremity edema currently being treated with Lasix.  On examination, he has significant hyperpigmentation of the lower extremities and woody appearance consistent with chronic venous insufficiency.  Plan: TED hose   Paroxysmal atrial fibrillation - Restart home anticoagulation - Restart home atenolol and carvedilol (reviewed cardiology notes, patient is meant to be on 2 beta-blockers) - Restart home flecainide   Hypertension - Continue home antihypertensives   Type 2 diabetes mellitus without complication, without long-term current use of insulin ([place]) Managed with diet only.  No  indication for SSI   DVT prophylaxis: Apixaban Code Status: Full Family Communication: None today Disposition Plan: Status is: Inpatient Remains inpatient appropriate because: Symptomatic hyponatremia.  If sodium above 128  anticipate discharge home 11/10.     Level of care: Med-Surg  Consultants:  Nephrology  Procedures:  None  Antimicrobials: None   Subjective: Seen and examined.  Resting comfortably in bed.  No visible distress.  No complaints of pain.  Objective: Vitals:   06/18/22 2027 06/19/22 0352 06/19/22 0655 06/19/22 0911  BP: (!) 153/72 117/64  131/79  Pulse: 70 (!) 58  70  Resp: 18 17  16   Temp: 98.9 F (37.2 C) 97.7 F (36.5 C)  98.1 F (36.7 C)  TempSrc:      SpO2: 99% 95%  93%  Weight:   125.6 kg   Height:        Intake/Output Summary (Last 24 hours) at 06/19/2022 1419 Last data filed at 06/19/2022 0900 Gross per 24 hour  Intake 350 ml  Output 600 ml  Net -250 ml   Filed Weights   06/17/22 1711 06/18/22 1405 06/19/22 0655  Weight: 128.8 kg 128.8 kg 125.6 kg    Examination:  General exam: No acute distress Respiratory system: Clear to auscultation. Respiratory effort normal. Cardiovascular system: S1-S2, RRR, no murmurs, 2+ pitting edema BLE Gastrointestinal system: Soft, NT/ND, normal bowel sounds Central nervous system: Alert and oriented. No focal neurological deficits. Extremities: Symmetric 5 x 5 power. Skin: No rashes, lesions or ulcers Psychiatry: Judgement and insight appear normal. Mood & affect appropriate.     Data Reviewed: I have personally reviewed following labs and imaging studies  CBC: Recent Labs  Lab 06/17/22 1719  WBC 8.3  NEUTROABS 6.4  HGB 11.2*  HCT 32.8*  MCV 79.8*  PLT 238   Basic Metabolic Panel: Recent Labs  Lab 06/17/22 1719 06/18/22 0453 06/18/22 1033 06/18/22 1755 06/19/22 0120 06/19/22 1228  NA 119* 124* 124* 124* 125* 125*  K 4.9 4.5 4.1 4.7 4.7 4.3  CL 86* 91* 92* 88* 90* 93*  CO2 26 27 26 27 28 25   GLUCOSE 119* 124* 150* 121* 122* 155*  BUN 12 16 22 18  26* 35*  CREATININE 0.69 0.69 0.73 0.61 0.78 1.04  CALCIUM 8.7* 8.6* 8.4* 8.4* 8.6* 8.6*  MG 2.1  --   --   --   --   --    GFR: Estimated  Creatinine Clearance: 95.6 mL/min (by C-G formula based on SCr of 1.04 mg/dL). Liver Function Tests: Recent Labs  Lab 06/19/22 1228  AST 29  ALT 37  ALKPHOS 74  BILITOT 1.1  PROT 6.8  ALBUMIN 3.5   No results for input(s): "LIPASE", "AMYLASE" in the last 168 hours. No results for input(s): "AMMONIA" in the last 168 hours. Coagulation Profile: No results for input(s): "INR", "PROTIME" in the last 168 hours. Cardiac Enzymes: No results for input(s): "CKTOTAL", "CKMB", "CKMBINDEX", "TROPONINI" in the last 168 hours. BNP (last 3 results) No results for input(s): "PROBNP" in the last 8760 hours. HbA1C: No results for input(s): "HGBA1C" in the last 72 hours. CBG: No results for input(s): "GLUCAP" in the last 168 hours. Lipid Profile: No results for input(s): "CHOL", "HDL", "LDLCALC", "TRIG", "CHOLHDL", "LDLDIRECT" in the last 72 hours. Thyroid Function Tests: No results for input(s): "TSH", "T4TOTAL", "FREET4", "T3FREE", "THYROIDAB" in the last 72 hours. Anemia Panel: No results for input(s): "VITAMINB12", "FOLATE", "FERRITIN", "TIBC", "IRON", "RETICCTPCT" in the last 72 hours. Sepsis Labs: No results for  input(s): "PROCALCITON", "LATICACIDVEN" in the last 168 hours.  Recent Results (from the past 240 hour(s))  Resp Panel by RT-PCR (Flu A&B, Covid) Anterior Nasal Swab     Status: None   Collection Time: 06/17/22  5:19 PM   Specimen: Anterior Nasal Swab  Result Value Ref Range Status   SARS Coronavirus 2 by RT PCR NEGATIVE NEGATIVE Final    Comment: (NOTE) SARS-CoV-2 target nucleic acids are NOT DETECTED.  The SARS-CoV-2 RNA is generally detectable in upper respiratory specimens during the acute phase of infection. The lowest concentration of SARS-CoV-2 viral copies this assay can detect is 138 copies/mL. A negative result does not preclude SARS-Cov-2 infection and should not be used as the sole basis for treatment or other patient management decisions. A negative result may  occur with  improper specimen collection/handling, submission of specimen other than nasopharyngeal swab, presence of viral mutation(s) within the areas targeted by this assay, and inadequate number of viral copies(<138 copies/mL). A negative result must be combined with clinical observations, patient history, and epidemiological information. The expected result is Negative.  Fact Sheet for Patients:  EntrepreneurPulse.com.au  Fact Sheet for Healthcare Providers:  IncredibleEmployment.be  This test is no t yet approved or cleared by the [place] FDA and  has been authorized for detection and/or diagnosis of SARS-CoV-2 by FDA under an Emergency Use Authorization (EUA). This EUA will remain  in effect (meaning this test can be used) for the duration of the COVID-19 declaration under Section 564(b)(1) of the Act, 21 U.S.C.section 360bbb-3(b)(1), unless the authorization is terminated  or revoked sooner.       Influenza A by PCR NEGATIVE NEGATIVE Final   Influenza B by PCR NEGATIVE NEGATIVE Final    Comment: (NOTE) The Xpert Xpress SARS-CoV-2/FLU/RSV plus assay is intended as an aid in the diagnosis of influenza from Nasopharyngeal swab specimens and should not be used as a sole basis for treatment. Nasal washings and aspirates are unacceptable for Xpert Xpress SARS-CoV-2/FLU/RSV testing.  Fact Sheet for Patients: EntrepreneurPulse.com.au  Fact Sheet for Healthcare Providers: IncredibleEmployment.be  This test is not yet approved or cleared by the [place] FDA and has been authorized for detection and/or diagnosis of SARS-CoV-2 by FDA under an Emergency Use Authorization (EUA). This EUA will remain in effect (meaning this test can be used) for the duration of the COVID-19 declaration under Section 564(b)(1) of the Act, 21 U.S.C. section 360bbb-3(b)(1), unless the authorization is terminated  or revoked.  Performed at [facility], [place], [place] [address]   Respiratory (~20 pathogens) panel by PCR     Status: None   Collection Time: 06/18/22  2:30 PM   Specimen: Nasopharyngeal Swab  Result Value Ref Range Status   Adenovirus NOT DETECTED NOT DETECTED Final   Coronavirus 229E NOT DETECTED NOT DETECTED Final    Comment: (NOTE) The Coronavirus on the Respiratory Panel, DOES NOT test for the novel  Coronavirus (2019 nCoV)    Coronavirus HKU1 NOT DETECTED NOT DETECTED Final   Coronavirus NL63 NOT DETECTED NOT DETECTED Final   Coronavirus OC43 NOT DETECTED NOT DETECTED Final   Metapneumovirus NOT DETECTED NOT DETECTED Final   Rhinovirus / Enterovirus NOT DETECTED NOT DETECTED Final   Influenza A NOT DETECTED NOT DETECTED Final   Influenza B NOT DETECTED NOT DETECTED Final   Parainfluenza Virus 1 NOT DETECTED NOT DETECTED Final   Parainfluenza Virus 2 NOT DETECTED NOT DETECTED Final   Parainfluenza Virus 3 NOT DETECTED NOT  DETECTED Final   Parainfluenza Virus 4 NOT DETECTED NOT DETECTED Final   Respiratory Syncytial Virus NOT DETECTED NOT DETECTED Final   Bordetella pertussis NOT DETECTED NOT DETECTED Final   Bordetella Parapertussis NOT DETECTED NOT DETECTED Final   Chlamydophila pneumoniae NOT DETECTED NOT DETECTED Final   Mycoplasma pneumoniae NOT DETECTED NOT DETECTED Final    Comment: Performed at [facility], [place] [address], [place], [place] [address]         Radiology Studies: DG Chest 2 View  Result Date: 06/17/2022 CLINICAL DATA:  Cough and wheezing. Pneumonia suspected. EXAM: CHEST - 2 VIEW COMPARISON:  01/04/2022 FINDINGS: Patient's chin again obscures the apices. There is no evidence of focal airspace disease. Stable heart size and mediastinal contours. No pulmonary edema. No pleural effusion or pneumothorax. Thoracic spondylosis. IMPRESSION: No acute chest findings, particularly no evidence of pneumonia. Electronically  Signed   By: Keith Rake M.D.   On: 06/17/2022 17:50        Scheduled Meds:  amLODipine  5 mg Oral Daily   apixaban  5 mg Oral BID   atenolol  25 mg Oral BID   benzonatate  100 mg Oral TID   carvedilol  6.25 mg Oral BID WC   flecainide  100 mg Oral BID   folic acid  1 mg Oral Daily   furosemide  20 mg Oral BID   guaiFENesin  600 mg Oral BID   hyoscyamine  0.375 mg Oral Q12H   lisinopril  10 mg Oral Daily   montelukast  10 mg Oral QHS   multivitamin with minerals  1 tablet Oral Daily   pantoprazole  40 mg Oral Daily   urea  15 g Oral BID   Continuous Infusions:   LOS: 0 days      Sidney Ace, MD [facility]   If 7PM-7AM, please contact night-coverage  06/19/2022, 2:19 PM

## 2022-06-19 NOTE — Progress Notes (Signed)
Occupational Therapy Treatment Patient Details Name: Carl [name] MRN: [id number] DOB: February 10, 1951 Today's Date: 06/19/2022   History of present illness Pt is a 71 yo male that presented to ED for SOB, cough, noted for hyponatremia, up to 124. PMH of aspergers syndrome, asthma, afib, GERD, HTN, psychogenic polydipsia.   OT comments  Patient in recliner upon arrival and agreeable to OT treatment session. Pill box test given to address medication management. Tangential speech, requiring max redirection and VC throughout session. Patient failed assessement. Based on the results of the assessment patient would not be safe to manage medications independently in the home. Patient will need intermittent assistance for safety, medication management, ALDs, and other IADL tasks.  Patient stating "If my medication bottle states I have to take 2 pills per day, sometimes only take 1, but it works for me." Patient educated on following instructions on medication bottles for safety. Patient left in recliner with call bell in reach and all needs met.    Recommendations for follow up therapy are one component of a multi-disciplinary discharge planning process, led by the attending physician.  Recommendations may be updated based on patient status, additional functional criteria and insurance authorization.    Follow Up Recommendations  No OT follow up    Assistance Recommended at Discharge Intermittent Supervision/Assistance  Patient can return home with the following  Direct supervision/assist for medications management;Assistance with cooking/housework;Direct supervision/assist for financial management;Assist for transportation   Equipment Recommendations  None recommended by OT       Precautions / Restrictions Precautions Precautions: Fall Restrictions Weight Bearing Restrictions: No          Balance Overall balance assessment: Needs assistance Sitting-balance support: Feet  supported Sitting balance-Leahy Scale: Good                                     ADL either performed or assessed with clinical judgement    Extremity/Trunk Assessment Upper Extremity Assessment Upper Extremity Assessment: Generalized weakness   Lower Extremity Assessment Lower Extremity Assessment: Generalized weakness        Vision Baseline Vision/History: 1 Wears glasses Patient Visual Report: No change from baseline            Cognition Arousal/Alertness: Awake/alert Behavior During Therapy: WFL for tasks assessed/performed Overall Cognitive Status: No family/caregiver present to determine baseline cognitive functioning Area of Impairment: Safety/judgement, Awareness, Problem solving, Following commands                       Following Commands: Follows one step commands with increased time Safety/Judgement: Decreased awareness of safety, Decreased awareness of deficits   Problem Solving: Requires verbal cues, Slow processing, Decreased initiation General Comments: oriented but verbose, requiring max VC for redirection.                   Pertinent Vitals/ Pain       Pain Assessment Pain Assessment: No/denies pain   Frequency  Min 2X/week        Progress Toward Goals  OT Goals(current goals can now be found in the care plan section)     Acute Rehab OT Goals Patient Stated Goal: return home OT Goal Formulation: With patient Time For Goal Achievement: 07/02/22 Potential to Achieve Goals: Good   AM-PAC OT "6 Clicks" Daily Activity     Outcome Measure   Help from another person eating meals?: None Help  from another person taking care of personal grooming?: None Help from another person toileting, which includes using toliet, bedpan, or urinal?: None Help from another person bathing (including washing, rinsing, drying)?: None Help from another person to put on and taking off regular upper body clothing?: None Help from  another person to put on and taking off regular lower body clothing?: A Little 6 Click Score: 23    End of Session Equipment Utilized During Treatment: Other (comment) (pill box test)  OT Visit Diagnosis: Muscle weakness (generalized) (M62.81)   Activity Tolerance Patient tolerated treatment well   Patient Left in chair;with call bell/phone within reach   Nurse Communication Mobility status        Time: 0569-7948 OT Time Calculation (min): 15 min  Charges:      Tomasa Blase, OTS 06/19/2022, 2:24 PM

## 2022-06-19 NOTE — Progress Notes (Signed)
[facility]  ROUNDING NOTE   Subjective:   Carl [name] is a 71 y.o. male with past medical history of atrial fib, hypertension, Asperger Syndrome, and psychogenic polydipsia. He presents to the Ed with complaints of shortness of breath. He has been admitted under observation for Hyponatremia [E87.1]  Patient is known to our practice from previous admissions for hyponatremia.    Patient seen sitting up in chair, alert and oriented Tolerating meals without nausea and vomiting Remains on room air Lower extremity edema remains   Objective:  Vital signs in last 24 hours:  Temp:  [97.7 F (36.5 C)-98.9 F (37.2 C)] 98.1 F (36.7 C) (11/09 0911) Pulse Rate:  [56-70] 70 (11/09 0911) Resp:  [12-18] 16 (11/09 0911) BP: (111-153)/(54-79) 131/79 (11/09 0911) SpO2:  [93 %-99 %] 93 % (11/09 0911) Weight:  [125.6 kg-128.8 kg] 125.6 kg (11/09 0655)  Weight change: 0.001 kg Filed Weights   06/17/22 1711 06/18/22 1405 06/19/22 0655  Weight: 128.8 kg 128.8 kg 125.6 kg    Intake/Output: I/O last 3 completed shifts: In: -  Out: 600 [Urine:600]   Intake/Output this shift:  Total I/O In: 350 [P.O.:350] Out: -   Physical Exam: General: NAD  Head: Normocephalic, atraumatic. Moist oral mucosal membranes  Eyes: Anicteric  Lungs:  Clear to auscultation, normal effort, room air  Heart: Regular rate and rhythm  Abdomen:  Soft, nontender  Extremities:  1-2+ peripheral edema.  Neurologic: Nonfocal, moving all four extremities  Skin: No lesions  Access: None    Basic Metabolic Panel: Recent Labs  Lab 06/17/22 1719 06/18/22 0453 06/18/22 1033 06/18/22 1755 06/19/22 0120  NA 119* 124* 124* 124* 125*  K 4.9 4.5 4.1 4.7 4.7  CL 86* 91* 92* 88* 90*  CO2 26 27 26 27 28   GLUCOSE 119* 124* 150* 121* 122*  BUN 12 16 22 18  26*  CREATININE 0.69 0.69 0.73 0.61 0.78  CALCIUM 8.7* 8.6* 8.4* 8.4* 8.6*  MG 2.1  --   --   --   --      Liver Function Tests: No  results for input(s): "AST", "ALT", "ALKPHOS", "BILITOT", "PROT", "ALBUMIN" in the last 168 hours. No results for input(s): "LIPASE", "AMYLASE" in the last 168 hours. No results for input(s): "AMMONIA" in the last 168 hours.  CBC: Recent Labs  Lab 06/17/22 1719  WBC 8.3  NEUTROABS 6.4  HGB 11.2*  HCT 32.8*  MCV 79.8*  PLT 238     Cardiac Enzymes: No results for input(s): "CKTOTAL", "CKMB", "CKMBINDEX", "TROPONINI" in the last 168 hours.  BNP: Invalid input(s): "POCBNP"  CBG: No results for input(s): "GLUCAP" in the last 168 hours.  Microbiology: Results for orders placed or performed during the hospital encounter of 06/17/22  Resp Panel by RT-PCR (Flu A&B, Covid) Anterior Nasal Swab     Status: None   Collection Time: 06/17/22  5:19 PM   Specimen: Anterior Nasal Swab  Result Value Ref Range Status   SARS Coronavirus 2 by RT PCR NEGATIVE NEGATIVE Final    Comment: (NOTE) SARS-CoV-2 target nucleic acids are NOT DETECTED.  The SARS-CoV-2 RNA is generally detectable in upper respiratory specimens during the acute phase of infection. The lowest concentration of SARS-CoV-2 viral copies this assay can detect is 138 copies/mL. A negative result does not preclude SARS-Cov-2 infection and should not be used as the sole basis for treatment or other patient management decisions. A negative result may occur with  improper specimen collection/handling, submission of specimen  other than nasopharyngeal swab, presence of viral mutation(s) within the areas targeted by this assay, and inadequate number of viral copies(<138 copies/mL). A negative result must be combined with clinical observations, patient history, and epidemiological information. The expected result is Negative.  Fact Sheet for Patients:  BloggerCourse.com  Fact Sheet for Healthcare Providers:  SeriousBroker.it  This test is no t yet approved or cleared by the [place] FDA and  has been authorized for detection and/or diagnosis of SARS-CoV-2 by FDA under an Emergency Use Authorization (EUA). This EUA will remain  in effect (meaning this test can be used) for the duration of the COVID-19 declaration under Section 564(b)(1) of the Act, 21 U.S.C.section 360bbb-3(b)(1), unless the authorization is terminated  or revoked sooner.       Influenza A by PCR NEGATIVE NEGATIVE Final   Influenza B by PCR NEGATIVE NEGATIVE Final    Comment: (NOTE) The Xpert Xpress SARS-CoV-2/FLU/RSV plus assay is intended as an aid in the diagnosis of influenza from Nasopharyngeal swab specimens and should not be used as a sole basis for treatment. Nasal washings and aspirates are unacceptable for Xpert Xpress SARS-CoV-2/FLU/RSV testing.  Fact Sheet for Patients: BloggerCourse.com  Fact Sheet for Healthcare Providers: SeriousBroker.it  This test is not yet approved or cleared by the [place] FDA and has been authorized for detection and/or diagnosis of SARS-CoV-2 by FDA under an Emergency Use Authorization (EUA). This EUA will remain in effect (meaning this test can be used) for the duration of the COVID-19 declaration under Section 564(b)(1) of the Act, 21 U.S.C. section 360bbb-3(b)(1), unless the authorization is terminated or revoked.  Performed at [facility], [address] [place], [place] [address]   Respiratory (~20 pathogens) panel by PCR     Status: None   Collection Time: 06/18/22  2:30 PM   Specimen: Nasopharyngeal Swab  Result Value Ref Range Status   Adenovirus NOT DETECTED NOT DETECTED Final   Coronavirus 229E NOT DETECTED NOT DETECTED Final    Comment: (NOTE) The Coronavirus on the Respiratory Panel, DOES NOT test for the novel  Coronavirus (2019 nCoV)    Coronavirus HKU1 NOT DETECTED NOT DETECTED Final   Coronavirus NL63 NOT DETECTED NOT DETECTED Final   Coronavirus OC43 NOT  DETECTED NOT DETECTED Final   Metapneumovirus NOT DETECTED NOT DETECTED Final   Rhinovirus / Enterovirus NOT DETECTED NOT DETECTED Final   Influenza A NOT DETECTED NOT DETECTED Final   Influenza B NOT DETECTED NOT DETECTED Final   Parainfluenza Virus 1 NOT DETECTED NOT DETECTED Final   Parainfluenza Virus 2 NOT DETECTED NOT DETECTED Final   Parainfluenza Virus 3 NOT DETECTED NOT DETECTED Final   Parainfluenza Virus 4 NOT DETECTED NOT DETECTED Final   Respiratory Syncytial Virus NOT DETECTED NOT DETECTED Final   Bordetella pertussis NOT DETECTED NOT DETECTED Final   Bordetella Parapertussis NOT DETECTED NOT DETECTED Final   Chlamydophila pneumoniae NOT DETECTED NOT DETECTED Final   Mycoplasma pneumoniae NOT DETECTED NOT DETECTED Final    Comment: Performed at [facility], [address], [place], [place] [address]    Coagulation Studies: No results for input(s): "LABPROT", "INR" in the last 72 hours.  Urinalysis: No results for input(s): "COLORURINE", "LABSPEC", "PHURINE", "GLUCOSEU", "HGBUR", "BILIRUBINUR", "KETONESUR", "PROTEINUR", "UROBILINOGEN", "NITRITE", "LEUKOCYTESUR" in the last 72 hours.  Invalid input(s): "APPERANCEUR"    Imaging: DG Chest 2 View  Result Date: 06/17/2022 CLINICAL DATA:  Cough and wheezing. Pneumonia suspected. EXAM: CHEST - 2 VIEW COMPARISON:  01/04/2022 FINDINGS: Patient's chin again obscures the apices. There is no evidence of focal airspace disease. Stable heart size and mediastinal contours. No pulmonary edema. No pleural effusion or pneumothorax. Thoracic spondylosis. IMPRESSION: No acute chest findings, particularly no evidence of pneumonia. Electronically Signed   By: Keith Rake M.D.   On: 06/17/2022 17:50     Medications:     amLODipine  5 mg Oral Daily   apixaban  5 mg Oral BID   atenolol  25 mg Oral BID   benzonatate  100 mg Oral TID   carvedilol  6.25 mg Oral BID WC   flecainide  100 mg Oral BID   folic acid  1 mg Oral Daily    furosemide  20 mg Oral BID   guaiFENesin  600 mg Oral BID   hyoscyamine  0.375 mg Oral Q12H   lisinopril  10 mg Oral Daily   montelukast  10 mg Oral QHS   multivitamin with minerals  1 tablet Oral Daily   pantoprazole  40 mg Oral Daily   urea  15 g Oral BID   acetaminophen **OR** acetaminophen, albuterol, polyethylene glycol  Assessment/ Plan:  [name] is a 71 y.o.  male with past medical history of atrial fib, hypertension, Asperger Syndrome, and psychogenic polydipsia. He presents to the Ed with complaints of shortness of breath. He has been admitted under observation for Hyponatremia [E87.1]   Hyponatremia likely due to fluid overload. known history of polydipsia. Discharged previously on salt tabs that were stopped in June due to lower extremity edema. Sodium on ED 119. Lower extremity edema present, primary team avoided 3% hypertonic.    Sodium 125 today.  No history of liver malfunction, will order tolvaptan 15 mg once.  We will hold furosemide today.  Patient advised to adhere to fluid restriction at discharge.  Continue urea packets.  Goal sodium 128-130 before consideration of discharge.  2.  Acute bronchitis .  Respiratory viral panel negative.  Supportive care.   LOS: 0 Carl [name] 11/9/202311:22 AM

## 2022-06-19 NOTE — Consult Note (Signed)
MEDICATION RELATED CONSULT NOTE - INITIAL   Pharmacy Consult for tolvaptan monitoring Indication: hyponatremia   No Known Allergies  Intake/Output from previous day: 11/08 0701 - 11/09 0700 In: -  Out: 600 [Urine:600] Intake/Output from this shift: Total I/O In: 350 [P.O.:350] Out: -   Labs: Recent Labs    06/17/22 1719 06/18/22 0453 06/18/22 1033 06/18/22 1755 06/19/22 0120  WBC 8.3  --   --   --   --   HGB 11.2*  --   --   --   --   HCT 32.8*  --   --   --   --   PLT 238  --   --   --   --   CREATININE 0.69   < > 0.73 0.61 0.78  MG 2.1  --   --   --   --    < > = values in this interval not displayed.   Estimated Creatinine Clearance: 124.3 mL/min (by C-G formula based on SCr of 0.78 mg/dL).   Medical History: Past Medical History:  Diagnosis Date   Anemia    Asperger's syndrome    Asthma    Atrial fibrillation (HCC)    Diverticulosis    GERD (gastroesophageal reflux disease)    History of MRSA infection    Hypertension    Hyponatremia    Psoriasis    Valvular heart disease     Medications:  11/8 Furosemide 20 mg PO BID 11/8 Urea oral 15 gram PO BID 11/9 0930 Tolvaptan 15 mg PO x 1   Assessment: 71 y.o. male with medical history significant of asthma, atrial fibrillation on AC, hypertension, psychogenic polydipsia, recurrent hyponatremia admissions, presented to ED with productive cough with congestion and intermittent SOB. Patient found to have hyponatremia (Na =119)  on admission. Admits to drinking more water than he should. Patient also with lower extremity edema. Started on furosemide, fluid restriction, and urea tablets on admission.   Date Time Na 11/7 1719 119 11/8 0453 124 11/9 0120 125 11/9 0930 Tolvaptan 15 mg PO x 1 given 11/9 1228 125  Goal of Therapy:  Goal sodium 128-130 prior to discharge Avoid Na correction over 8 hours in 24 hour period   Plan:  Continue with tolvaptan 15 mg PO x 1 Check Na Q8H x 3 following dose  Pharmacy  will monitor peripherally and reach out to MD/nephrology if patient at risk for over-correction   Sharen Hones, PharmD, BCPS Clinical Pharmacist   06/19/2022,11:55 AM

## 2022-06-20 ENCOUNTER — Encounter: Payer: Self-pay | Admitting: Nephrology

## 2022-06-20 DIAGNOSIS — E871 Hypo-osmolality and hyponatremia: Secondary | ICD-10-CM | POA: Diagnosis not present

## 2022-06-20 DIAGNOSIS — L89321 Pressure ulcer of left buttock, stage 1: Secondary | ICD-10-CM | POA: Diagnosis not present

## 2022-06-20 DIAGNOSIS — I4891 Unspecified atrial fibrillation: Secondary | ICD-10-CM

## 2022-06-20 DIAGNOSIS — J209 Acute bronchitis, unspecified: Secondary | ICD-10-CM | POA: Diagnosis not present

## 2022-06-20 LAB — SODIUM
Sodium: 126 mmol/L — ABNORMAL LOW (ref 135–145)
Sodium: 128 mmol/L — ABNORMAL LOW (ref 135–145)

## 2022-06-20 MED ORDER — UREA 15 G PO PACK: 15.0000 g | PACK | Freq: Two times a day (BID) | ORAL | 1 refills | Status: AC

## 2022-06-20 MED ORDER — TOLVAPTAN 15 MG PO TABS
15.0000 mg | ORAL_TABLET | Freq: Once | ORAL | Status: AC
  Administered 2022-06-20: 15 mg via ORAL
  Filled 2022-06-20: qty 1

## 2022-06-20 MED ORDER — FUROSEMIDE 20 MG PO TABS: 20.0000 mg | ORAL_TABLET | Freq: Two times a day (BID) | ORAL | Status: AC

## 2022-06-20 NOTE — Consult Note (Signed)
MEDICATION RELATED CONSULT NOTE - INITIAL   Pharmacy Consult for tolvaptan monitoring Indication: hyponatremia   No Known Allergies  Intake/Output from previous day: 11/09 0701 - 11/10 0700 In: 350 [P.O.:350] Out: -  Intake/Output from this shift: No intake/output data recorded.  Labs: Recent Labs    06/17/22 1719 06/18/22 0453 06/18/22 1755 06/19/22 0120 06/19/22 1228  WBC 8.3  --   --   --   --   HGB 11.2*  --   --   --   --   HCT 32.8*  --   --   --   --   PLT 238  --   --   --   --   CREATININE 0.69   < > 0.61 0.78 1.04  MG 2.1  --   --   --   --   ALBUMIN  --   --   --   --  3.5  PROT  --   --   --   --  6.8  AST  --   --   --   --  29  ALT  --   --   --   --  37  ALKPHOS  --   --   --   --  74  BILITOT  --   --   --   --  1.1  BILIDIR  --   --   --   --  0.2  IBILI  --   --   --   --  0.9   < > = values in this interval not displayed.    Estimated Creatinine Clearance: 95.6 mL/min (by C-G formula based on SCr of 1.04 mg/dL).   Medical History: Past Medical History:  Diagnosis Date   Anemia    Asperger's syndrome    Asthma    Atrial fibrillation (HCC)    Diverticulosis    GERD (gastroesophageal reflux disease)    History of MRSA infection    Hypertension    Hyponatremia    Psoriasis    Valvular heart disease     Medications:  11/8 Furosemide 20 mg PO BID 11/8 Urea oral 15 gram PO BID 11/9 0930 Tolvaptan 15 mg PO x 1   Assessment: 71 y.o. male with medical history significant of asthma, atrial fibrillation on AC, hypertension, psychogenic polydipsia, recurrent hyponatremia admissions, presented to ED with productive cough with congestion and intermittent SOB. Patient found to have hyponatremia (Na =119)  on admission. Admits to drinking more water than he should. Patient also with lower extremity edema. Started on furosemide, fluid restriction, and urea tablets on admission.    Date Time Na 11/7 1719 119 11/8 0453 124 11/9 0120 125 11/9 0930 Tolvaptan 15 mg PO x 1 given 11/9 1228 125 11/9     2336    126   Goal of Therapy:  Goal sodium 128-130 prior to discharge Avoid Na correction over 8 hours in 24 hour period   Plan:  Continue with tolvaptan 15 mg PO x 1 Check Na Q8H x 3 following dose  Pharmacy will monitor peripherally and reach out to MD/nephrology if patient at risk for over-correction  Next Na on 11/10 @ 0800  Jamisha Hoeschen D, PharmD Clinical Pharmacist   06/20/2022,1:45 AM

## 2022-06-20 NOTE — TOC Initial Note (Signed)
Transition of Care [facility]) - Initial/Assessment Note    Patient Details  Name: Carl [name] MRN: [id number] Date of Birth: 08/05/1951  [facility] CM/SW Contact:    Colen Darling, [place] Phone Number: 06/20/2022, 11:10 AM  Clinical Narrative:                  TOC met with the patient. He lives alone and his brother Herbie [place] is his DPOA. He uses the pharmacy at [facility] in [place]. His PCP is Dr. Wynetta Emery at [facility].  He uses [employer] or gets a ride from a friend.  Expected Discharge Plan: Home/Self Care     Patient Goals and CMS Choice        Expected Discharge Plan and Services Expected Discharge Plan: Home/Self Care       Living arrangements for the past 2 months: Single Family Home Expected Discharge Date: 06/20/22                                    Prior Living Arrangements/Services Living arrangements for the past 2 months: Single Family Home Lives with:: Self Patient language and need for interpreter reviewed:: No Do you feel safe going back to the place where you live?: Yes      Need for Family Participation in Patient Care: Yes (Comment) Care giver support system in place?: Yes (comment)   Criminal Activity/Legal Involvement Pertinent to Current Situation/Hospitalization: No - Comment as needed  Activities of Daily Living Home Assistive Devices/Equipment: Cane (specify quad or straight) ADL Screening (condition at time of admission) Patient's cognitive ability adequate to safely complete daily activities?: Yes Is the patient deaf or have difficulty hearing?: No Does the patient have difficulty seeing, even when wearing glasses/contacts?: No Does the patient have difficulty concentrating, remembering, or making decisions?: No Patient able to express need for assistance with ADLs?: Yes Does the patient have difficulty dressing or bathing?: No Independently performs ADLs?: Yes (appropriate for  developmental age) Does the patient have difficulty walking or climbing stairs?: No Weakness of Legs: None Weakness of Arms/Hands: None  Permission Sought/Granted Permission sought to share information with : Family Supports (Robert-brother- is DPOA)                Emotional Assessment Appearance:: Appears stated age Attitude/Demeanor/Rapport: Engaged   Orientation: : Oriented to Self, Oriented to Place, Oriented to  Time, Oriented to Situation      Admission diagnosis:  Hyponatremia [E87.1] Patient Active Problem List   Diagnosis Date Noted   Pressure injury of skin 06/18/2022   Acute bronchitis 06/17/2022   Chronic venous insufficiency 06/17/2022   GERD (gastroesophageal reflux disease)    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   Acute hyponatremia 04/28/2021   Constipation 04/28/2021   Atrial fibrillation (HCC)    Asperger's syndrome    Hyponatremia    Hypertension    Asthma    Postural dizziness with presyncope    Type 2 diabetes mellitus without complication, without long-term current use of insulin ([place]) 06/23/2018   PCP:  Baxter Hire, MD Pharmacy:   [place], [place] - [place] [place] [address] [place] [address] Phone: [phone] Fax: [phone]     Social Determinants of Health (SDOH) Interventions    Readmission Risk Interventions     No data to display

## 2022-06-20 NOTE — Hospital Course (Signed)
71 y.o. male with medical history significant of asthma, atrial fibrillation on AC, hypertension, psychogenic polydipsia, Asperger syndrome who presents to the ED with productive cough. Carl [name] states that he has been drinking more water than he should.  He states that multiple of his cardiac medications make him urinate frequently, and due to this he feels as though he is getting dehydrated and will overcompensate.  He is not currently taking his salt tablets as they were discontinued by his PCP due to lower extremity swelling.  He denies any dizziness, muscle cramps, headache, or brain fogginess.

## 2022-06-20 NOTE — Discharge Summary (Signed)
Physician Discharge Summary   Patient: Carl [name] MRN: [id number] DOB: [birth year]  Admit date:     06/17/2022  Discharge date: 06/20/22  Discharge Physician: Hollice Espy   PCP: Gracelyn Nurse, MD   Recommendations at discharge:   New medication: Urea 15 g p.o. twice daily Patient will follow-up with his PCP in the next 2 weeks.  With repeat basic metabolic panel checked then  Discharge Diagnoses: Principal Problem:   Hyponatremia Active Problems:   Acute bronchitis   Chronic venous insufficiency   Atrial fibrillation (HCC)   Hypertension   Type 2 diabetes mellitus without complication, without long-term current use of insulin (HCC)   Pressure injury of skin  Resolved Problems:   * No resolved hospital problems. *  Hospital Course: 71 y.o. male with medical history significant of asthma, atrial fibrillation on AC, hypertension, psychogenic polydipsia, Asperger syndrome who presents to the ED with productive cough. Mr. Turnage states that he has been drinking more water than he should.  He states that multiple of his cardiac medications make him urinate frequently, and due to this he feels as though he is getting dehydrated and will overcompensate.  He is not currently taking his salt tablets as they were discontinued by his PCP due to lower extremity swelling.  He denies any dizziness, muscle cramps, headache, or brain fogginess.  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 He followed up with outpatient Nephrology in March 2023; 1.5 L fluid restriction and salt tablets were recommended. It appears PCP discontinued salt tablets in June 2023 due to lower extremity edema with titration of Lasix to BID.  Patient presented to the emergency room with a sodium of 119, which had been 122 on 10/30.  Asymptomatic and euvolemic.  The following day, improved, but still low and patient given some  tolvaptan.  By day of discharge, sodium of 128.  Also given prescription for urea packets.      Acute bronchitis Patient presenting with several day history of productive cough, congestion, and occasional shortness of breath that is mild.  He denies any fever, rhinorrhea.  Chest x-ray negative.  Most consistent with acute bronchitis, likely viral.  COVID-19 and influenza negative.  Plan: Respiratory viral panel, negative As needed mucolytic's and antitussives   Chronic venous insufficiency Patient presenting with prolonged history of bilateral lower extremity edema currently being treated with Lasix.  On examination, he has significant hyperpigmentation of the lower extremities and woody appearance consistent with chronic venous insufficiency.  Plan: TED hose   Paroxysmal atrial fibrillation - Restart home anticoagulation - Restart home atenolol and carvedilol (reviewed cardiology notes, patient is meant to be on 2 beta-blockers) - Restart home flecainide  Stage I decubitus ulcer: Present on admission.  Encouraging additional ambulation. Pressure Injury 06/18/22 Buttocks Right Stage 1 -  Intact skin with non-blanchable redness of a localized area usually over a bony prominence. (Active)  06/18/22 1400  Location: Buttocks  Location Orientation: Right  Staging: Stage 1 -  Intact skin with non-blanchable redness of a localized area usually over a bony prominence.  Wound Description (Comments):   Present on Admission: Yes       Hypertension - Continue home antihypertensives   Type 2 diabetes mellitus without complication, without long-term current use of insulin (HCC) Managed with diet only.  No indication for SSI      Consultants: Nephrology Procedures performed: None Disposition: Home Diet recommendation:  Discharge Diet Orders (  From admission, onward)     Start     Ordered   06/20/22 0000  Diet - low sodium heart healthy        06/20/22 1055           Cardiac  diet DISCHARGE MEDICATION: Allergies as of 06/20/2022   No Known Allergies      Medication List     STOP taking these medications    fluticasone 110 MCG/ACT inhaler Commonly known as: FLOVENT HFA   polyethylene glycol 17 g packet Commonly known as: MIRALAX / GLYCOLAX   senna-docusate 8.6-50 MG tablet Commonly known as: Senokot-S   sodium chloride 1 g tablet       TAKE these medications    albuterol 108 (90 Base) MCG/ACT inhaler Commonly known as: VENTOLIN HFA Inhale 2 puffs into the lungs every 6 (six) hours as needed for wheezing.   amLODipine 5 MG tablet Commonly known as: NORVASC Take 5 mg by mouth daily.   ascorbic acid 1000 MG tablet Commonly known as: VITAMIN C Take 1,000 mg by mouth daily.   atenolol 25 MG tablet Commonly known as: TENORMIN Take 25 mg by mouth 2 (two) times daily.   B COMPLEX PO Take 1 tablet by mouth daily.   carvedilol 6.25 MG tablet Commonly known as: COREG Take 1 tablet (6.25 mg total) by mouth 2 (two) times daily with a meal.   cetirizine 10 MG tablet Commonly known as: ZYRTEC Take 10 mg by mouth daily.   ELIQUIS PO Take 5 mg by mouth 2 (two) times daily.   flecainide 100 MG tablet Commonly known as: TAMBOCOR Take 100 mg by mouth 2 (two) times daily.   folic acid 1 MG tablet Commonly known as: FOLVITE Take 1 mg by mouth daily.   furosemide 20 MG tablet Commonly known as: Lasix Take 1 tablet (20 mg total) by mouth 2 (two) times daily.   Garlic 100 MG Tabs Take 100 mg by mouth daily.   hyoscyamine 0.375 MG 12 hr tablet Commonly known as: LEVBID Take 1 tablet by mouth in the morning and at bedtime.   lisinopril 10 MG tablet Commonly known as: ZESTRIL Take 10 mg by mouth daily.   montelukast 10 MG tablet Commonly known as: SINGULAIR Take 10 mg by mouth at bedtime.   multivitamin tablet Take 1 tablet by mouth daily.   pantoprazole 40 MG tablet Commonly known as: PROTONIX Take 40 mg by mouth daily.   QC  TUMERIC COMPLEX PO Take 1 tablet by mouth daily.   SALMON OIL PO Take 1 capsule by mouth daily.   urea 15 g Pack oral packet Commonly known as: URE-NA Take 15 g by mouth 2 (two) times daily.   Zinc Citrate-Phytase 25-500 MG Caps Take 1 capsule by mouth daily.        Follow-up Information     Gracelyn Nurse, MD. Go on 06/25/2022.   Specialty: Internal Medicine Why: @3 :30pm Contact information: [address] [place] [phone]                Discharge Exam: [id number] Weights   06/17/22 1711 06/18/22 1405 06/19/22 0655  Weight: 128.8 kg 128.8 kg 125.6 kg   General: Alert and oriented x2, no acute distress Cardiovascular: Regular rate and rhythm, S1-S2  Condition at discharge: improving  The results of significant diagnostics from this hospitalization (including imaging, microbiology, ancillary and laboratory) are listed below for reference.   Imaging Studies: DG Chest 2 View  Result Date: 06/17/2022 CLINICAL DATA:  Cough and wheezing. Pneumonia suspected. EXAM: CHEST - 2 VIEW COMPARISON:  01/04/2022 FINDINGS: Patient's chin again obscures the apices. There is no evidence of focal airspace disease. Stable heart size and mediastinal contours. No pulmonary edema. No pleural effusion or pneumothorax. Thoracic spondylosis. IMPRESSION: No acute chest findings, particularly no evidence of pneumonia. Electronically Signed   By: Narda Rutherford M.D.   On: 06/17/2022 17:50    Microbiology: Results for orders placed or performed during the hospital encounter of 06/17/22  Resp Panel by RT-PCR (Flu A&B, Covid) Anterior Nasal Swab     Status: None   Collection Time: 06/17/22  5:19 PM   Specimen: Anterior Nasal Swab  Result Value Ref Range Status   SARS Coronavirus 2 by RT PCR NEGATIVE NEGATIVE Final    Comment: (NOTE) SARS-CoV-2 target nucleic acids are NOT DETECTED.  The SARS-CoV-2 RNA is generally detectable in upper respiratory specimens during  the acute phase of infection. The lowest concentration of SARS-CoV-2 viral copies this assay can detect is 138 copies/mL. A negative result does not preclude SARS-Cov-2 infection and should not be used as the sole basis for treatment or other patient management decisions. A negative result may occur with  improper specimen collection/handling, submission of specimen other than nasopharyngeal swab, presence of viral mutation(s) within the areas targeted by this assay, and inadequate number of viral copies(<138 copies/mL). A negative result must be combined with clinical observations, patient history, and epidemiological information. The expected result is Negative.  Fact Sheet for Patients:  BloggerCourse.com  Fact Sheet for Healthcare Providers:  SeriousBroker.it  This test is no t yet approved or cleared by the [place] FDA and  has been authorized for detection and/or diagnosis of SARS-CoV-2 by FDA under an Emergency Use Authorization (EUA). This EUA will remain  in effect (meaning this test can be used) for the duration of the COVID-19 declaration under Section 564(b)(1) of the Act, 21 U.S.C.section 360bbb-3(b)(1), unless the authorization is terminated  or revoked sooner.       Influenza A by PCR NEGATIVE NEGATIVE Final   Influenza B by PCR NEGATIVE NEGATIVE Final    Comment: (NOTE) The Xpert Xpress SARS-CoV-2/FLU/RSV plus assay is intended as an aid in the diagnosis of influenza from Nasopharyngeal swab specimens and should not be used as a sole basis for treatment. Nasal washings and aspirates are unacceptable for Xpert Xpress SARS-CoV-2/FLU/RSV testing.  Fact Sheet for Patients: BloggerCourse.com  Fact Sheet for Healthcare Providers: SeriousBroker.it  This test is not yet approved or cleared by the [place] FDA and has been authorized for detection and/or  diagnosis of SARS-CoV-2 by FDA under an Emergency Use Authorization (EUA). This EUA will remain in effect (meaning this test can be used) for the duration of the COVID-19 declaration under Section 564(b)(1) of the Act, 21 U.S.C. section 360bbb-3(b)(1), unless the authorization is terminated or revoked.  Performed at [facility], [address] [place], [place] [address]   Respiratory (~20 pathogens) panel by PCR     Status: None   Collection Time: 06/18/22  2:30 PM   Specimen: Nasopharyngeal Swab  Result Value Ref Range Status   Adenovirus NOT DETECTED NOT DETECTED Final   Coronavirus 229E NOT DETECTED NOT DETECTED Final    Comment: (NOTE) The Coronavirus on the Respiratory Panel, DOES NOT test for the novel  Coronavirus (2019 nCoV)    Coronavirus HKU1 NOT DETECTED NOT DETECTED Final   Coronavirus NL63 NOT DETECTED NOT DETECTED  Final   Coronavirus OC43 NOT DETECTED NOT DETECTED Final   Metapneumovirus NOT DETECTED NOT DETECTED Final   Rhinovirus / Enterovirus NOT DETECTED NOT DETECTED Final   Influenza A NOT DETECTED NOT DETECTED Final   Influenza B NOT DETECTED NOT DETECTED Final   Parainfluenza Virus 1 NOT DETECTED NOT DETECTED Final   Parainfluenza Virus 2 NOT DETECTED NOT DETECTED Final   Parainfluenza Virus 3 NOT DETECTED NOT DETECTED Final   Parainfluenza Virus 4 NOT DETECTED NOT DETECTED Final   Respiratory Syncytial Virus NOT DETECTED NOT DETECTED Final   Bordetella pertussis NOT DETECTED NOT DETECTED Final   Bordetella Parapertussis NOT DETECTED NOT DETECTED Final   Chlamydophila pneumoniae NOT DETECTED NOT DETECTED Final   Mycoplasma pneumoniae NOT DETECTED NOT DETECTED Final    Comment: Performed at [facility], [address] [place], [place] [address]    Labs: CBC: Recent Labs  Lab 06/17/22 1719  WBC 8.3  NEUTROABS 6.4  HGB 11.2*  HCT 32.8*  MCV 79.8*  PLT 238   Basic Metabolic Panel: Recent Labs  Lab 06/17/22 1719 06/18/22 0453  06/18/22 1033 06/18/22 1755 06/19/22 0120 06/19/22 1228 06/19/22 1548 06/19/22 2336 06/20/22 0805  NA 119* 124* 124* 124* 125* 125* 127* 126* 128*  K 4.9 4.5 4.1 4.7 4.7 4.3  --   --   --   CL 86* 91* 92* 88* 90* 93*  --   --   --   CO2 26 27 26 27 28 25   --   --   --   GLUCOSE 119* 124* 150* 121* 122* 155*  --   --   --   BUN 12 16 22 18  26* 35*  --   --   --   CREATININE 0.69 0.69 0.73 0.61 0.78 1.04  --   --   --   CALCIUM 8.7* 8.6* 8.4* 8.4* 8.6* 8.6*  --   --   --   MG 2.1  --   --   --   --   --   --   --   --    Liver Function Tests: Recent Labs  Lab 06/19/22 1228  AST 29  ALT 37  ALKPHOS 74  BILITOT 1.1  PROT 6.8  ALBUMIN 3.5   CBG: No results for input(s): "GLUCAP" in the last 168 hours.  Discharge time spent: less than 30 minutes.  Signed: , MD [facility] 06/20/2022

## 2022-06-20 NOTE — Progress Notes (Signed)
[facility]  ROUNDING NOTE   Subjective:   Carl [name] is a 71 y.o. male with past medical history of atrial fib, hypertension, Asperger Syndrome, and psychogenic polydipsia. He presents to the Ed with complaints of shortness of breath. He has been admitted under observation for Hyponatremia [E87.1]  Patient is known to our practice from previous admissions for hyponatremia.    Patient seen sitting in chair Appetite appropriate, denies nausea and vomiting Lower extremity edema improving  Sodium 126  Objective:  Vital signs in last 24 hours:  Temp:  [97.6 F (36.4 C)-99.1 F (37.3 C)] 98.7 F (37.1 C) (11/10 0815) Pulse Rate:  [58-75] 61 (11/10 0815) Resp:  [16-18] 16 (11/10 0815) BP: (123-132)/(59-69) 127/69 (11/10 0815) SpO2:  [93 %-97 %] 97 % (11/10 0815)  Weight change:  Filed Weights   06/17/22 1711 06/18/22 1405 06/19/22 0655  Weight: 128.8 kg 128.8 kg 125.6 kg    Intake/Output: I/O last 3 completed shifts: In: 350 [P.O.:350] Out: -    Intake/Output this shift:  No intake/output data recorded.  Physical Exam: General: NAD  Head: Normocephalic, atraumatic. Moist oral mucosal membranes  Eyes: Anicteric  Lungs:  Clear to auscultation, normal effort, room air  Heart: Regular rate and rhythm  Abdomen:  Soft, nontender  Extremities:  1-2+ peripheral edema.  Neurologic: Nonfocal, moving all four extremities  Skin: No lesions  Access: None    Basic Metabolic Panel: Recent Labs  Lab 06/17/22 1719 06/18/22 0453 06/18/22 1033 06/18/22 1755 06/19/22 0120 06/19/22 1228 06/19/22 1548 06/19/22 2336 06/20/22 0805  NA 119* 124* 124* 124* 125* 125* 127* 126* 128*  K 4.9 4.5 4.1 4.7 4.7 4.3  --   --   --   CL 86* 91* 92* 88* 90* 93*  --   --   --   CO2 26 27 26 27 28 25   --   --   --   GLUCOSE 119* 124* 150* 121* 122* 155*  --   --   --   BUN 12 16 22 18  26* 35*  --   --   --   CREATININE 0.69 0.69 0.73 0.61 0.78 1.04  --   --   --    CALCIUM 8.7* 8.6* 8.4* 8.4* 8.6* 8.6*  --   --   --   MG 2.1  --   --   --   --   --   --   --   --      Liver Function Tests: Recent Labs  Lab 06/19/22 1228  AST 29  ALT 37  ALKPHOS 74  BILITOT 1.1  PROT 6.8  ALBUMIN 3.5   No results for input(s): "LIPASE", "AMYLASE" in the last 168 hours. No results for input(s): "AMMONIA" in the last 168 hours.  CBC: Recent Labs  Lab 06/17/22 1719  WBC 8.3  NEUTROABS 6.4  HGB 11.2*  HCT 32.8*  MCV 79.8*  PLT 238     Cardiac Enzymes: No results for input(s): "CKTOTAL", "CKMB", "CKMBINDEX", "TROPONINI" in the last 168 hours.  BNP: Invalid input(s): "POCBNP"  CBG: No results for input(s): "GLUCAP" in the last 168 hours.  Microbiology: Results for orders placed or performed during the hospital encounter of 06/17/22  Resp Panel by RT-PCR (Flu A&B, Covid) Anterior Nasal Swab     Status: None   Collection Time: 06/17/22  5:19 PM   Specimen: Anterior Nasal Swab  Result Value Ref Range Status   SARS Coronavirus 2 by  RT PCR NEGATIVE NEGATIVE Final    Comment: (NOTE) SARS-CoV-2 target nucleic acids are NOT DETECTED.  The SARS-CoV-2 RNA is generally detectable in upper respiratory specimens during the acute phase of infection. The lowest concentration of SARS-CoV-2 viral copies this assay can detect is 138 copies/mL. A negative result does not preclude SARS-Cov-2 infection and should not be used as the sole basis for treatment or other patient management decisions. A negative result may occur with  improper specimen collection/handling, submission of specimen other than nasopharyngeal swab, presence of viral mutation(s) within the areas targeted by this assay, and inadequate number of viral copies(<138 copies/mL). A negative result must be combined with clinical observations, patient history, and epidemiological information. The expected result is Negative.  Fact Sheet for Patients:   BloggerCourse.com  Fact Sheet for Healthcare Providers:  SeriousBroker.it  This test is no t yet approved or cleared by the [place] FDA and  has been authorized for detection and/or diagnosis of SARS-CoV-2 by FDA under an Emergency Use Authorization (EUA). This EUA will remain  in effect (meaning this test can be used) for the duration of the COVID-19 declaration under Section 564(b)(1) of the Act, 21 U.S.C.section 360bbb-3(b)(1), unless the authorization is terminated  or revoked sooner.       Influenza A by PCR NEGATIVE NEGATIVE Final   Influenza B by PCR NEGATIVE NEGATIVE Final    Comment: (NOTE) The Xpert Xpress SARS-CoV-2/FLU/RSV plus assay is intended as an aid in the diagnosis of influenza from Nasopharyngeal swab specimens and should not be used as a sole basis for treatment. Nasal washings and aspirates are unacceptable for Xpert Xpress SARS-CoV-2/FLU/RSV testing.  Fact Sheet for Patients: BloggerCourse.com  Fact Sheet for Healthcare Providers: SeriousBroker.it  This test is not yet approved or cleared by the [place] FDA and has been authorized for detection and/or diagnosis of SARS-CoV-2 by FDA under an Emergency Use Authorization (EUA). This EUA will remain in effect (meaning this test can be used) for the duration of the COVID-19 declaration under Section 564(b)(1) of the Act, 21 U.S.C. section 360bbb-3(b)(1), unless the authorization is terminated or revoked.  Performed at [facility], [address] [place], [place] [address]   Respiratory (~20 pathogens) panel by PCR     Status: None   Collection Time: 06/18/22  2:30 PM   Specimen: Nasopharyngeal Swab  Result Value Ref Range Status   Adenovirus NOT DETECTED NOT DETECTED Final   Coronavirus 229E NOT DETECTED NOT DETECTED Final    Comment: (NOTE) The Coronavirus on the Respiratory  Panel, DOES NOT test for the novel  Coronavirus (2019 nCoV)    Coronavirus HKU1 NOT DETECTED NOT DETECTED Final   Coronavirus NL63 NOT DETECTED NOT DETECTED Final   Coronavirus OC43 NOT DETECTED NOT DETECTED Final   Metapneumovirus NOT DETECTED NOT DETECTED Final   Rhinovirus / Enterovirus NOT DETECTED NOT DETECTED Final   Influenza A NOT DETECTED NOT DETECTED Final   Influenza B NOT DETECTED NOT DETECTED Final   Parainfluenza Virus 1 NOT DETECTED NOT DETECTED Final   Parainfluenza Virus 2 NOT DETECTED NOT DETECTED Final   Parainfluenza Virus 3 NOT DETECTED NOT DETECTED Final   Parainfluenza Virus 4 NOT DETECTED NOT DETECTED Final   Respiratory Syncytial Virus NOT DETECTED NOT DETECTED Final   Bordetella pertussis NOT DETECTED NOT DETECTED Final   Bordetella Parapertussis NOT DETECTED NOT DETECTED Final   Chlamydophila pneumoniae NOT DETECTED NOT DETECTED Final   Mycoplasma pneumoniae NOT DETECTED NOT DETECTED Final  Comment: Performed at [facility], [address] [place], [place] [address]    Coagulation Studies: No results for input(s): "LABPROT", "INR" in the last 72 hours.  Urinalysis: No results for input(s): "COLORURINE", "LABSPEC", "PHURINE", "GLUCOSEU", "HGBUR", "BILIRUBINUR", "KETONESUR", "PROTEINUR", "UROBILINOGEN", "NITRITE", "LEUKOCYTESUR" in the last 72 hours.  Invalid input(s): "APPERANCEUR"    Imaging: No results found.   Medications:     amLODipine  5 mg Oral Daily   apixaban  5 mg Oral BID   atenolol  25 mg Oral BID   benzonatate  100 mg Oral TID   carvedilol  6.25 mg Oral BID WC   flecainide  100 mg Oral BID   folic acid  1 mg Oral Daily   furosemide  20 mg Oral BID   guaiFENesin  600 mg Oral BID   hyoscyamine  0.375 mg Oral Q12H   lisinopril  10 mg Oral Daily   montelukast  10 mg Oral QHS   multivitamin with minerals  1 tablet Oral Daily   pantoprazole  40 mg Oral Daily   tolvaptan  15 mg Oral Once   urea  15 g Oral BID    acetaminophen **OR** acetaminophen, albuterol, polyethylene glycol  Assessment/ Plan:  Mr. Masin Shatto is a 71 y.o.  male with past medical history of atrial fib, hypertension, Asperger Syndrome, and psychogenic polydipsia. He presents to the Ed with complaints of shortness of breath. He has been admitted under observation for Hyponatremia [E87.1]   Hyponatremia likely due to fluid overload. known history of polydipsia. Discharged previously on salt tabs that were stopped in June due to lower extremity edema. Sodium on ED 119. Lower extremity edema present, primary team avoided 3% hypertonic.    Sodium 126 today. Will repeat Tolvaptan dose today, Goal sodium 130.Will monitor labs in am  2.  Acute bronchitis .  Respiratory viral panel negative.  Continue supportive care.   LOS: 1 Raywood Wailes 11/10/202312:42 PM

## 2022-06-20 NOTE — TOC CM/SW Note (Deleted)
  Transition of Care [facility]) Screening Note   Patient Details  Name: Jerian Morais Date of Birth: 03-30-1951   Transition of Care [facility]) CM/SW Contact:    Tempie Hoist, LCSWA Phone Number: 06/20/2022, 11:06 AM    Transition of Care Department [facility]) has reviewed patient and no TOC needs have been identified at this time. We will continue to monitor patient advancement through interdisciplinary progression rounds. If new patient transition needs arise, please place a TOC consult.

## 2022-06-20 NOTE — Progress Notes (Signed)
Mobility Specialist - Progress Note     06/20/22 1051  Mobility  Activity Ambulated with assistance in hallway;Stood at bedside  Level of Assistance Independent  Assistive Device Front wheel walker  Distance Ambulated (ft) 160 ft  Activity Response Tolerated well  Mobility Referral Yes  $Mobility charge 1 Mobility   Pt resting in recliner on RA upon entry. Pt STS and ambulates in hallway Independently but, still used a walker for safety during the session. Pt returned to recliner and lefting with needs in reach as well as chair alarm on.   Johnathan Hausen Mobility Specialist 06/20/22, 10:57 AM

## 2022-11-09 IMAGING — US US EXTREM LOW VENOUS
1 series · 14 of 24 positions shown · non-contrast
Comparison: None.

CLINICAL DATA: Bilateral leg swelling

EXAM:
BILATERAL LOWER EXTREMITY VENOUS DOPPLER ULTRASOUND
TECHNIQUE: Gray-scale sonography with compression, as well as color and duplex
ultrasound, were performed to evaluate the deep venous system(s)
from the level of the common femoral vein through the popliteal and
proximal calf veins.

[Series 1: us venous img lower bilat (dvt) · portal-venous · 14 of 61 slices shown]
[im 1/61]
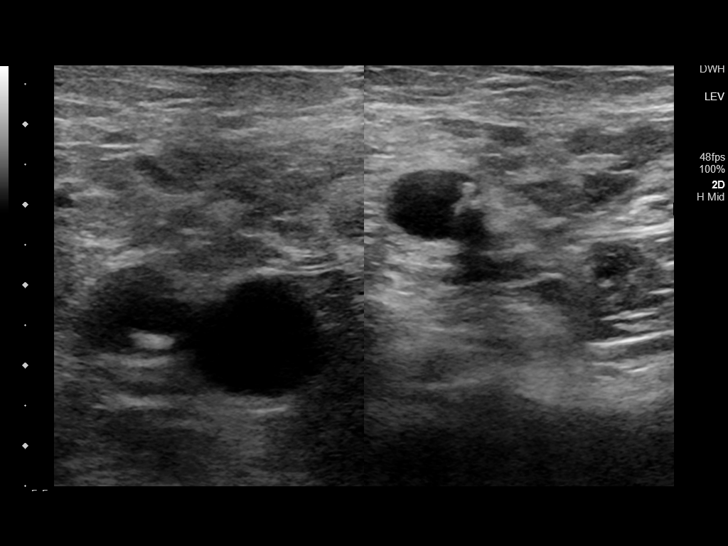
[im 6/61]
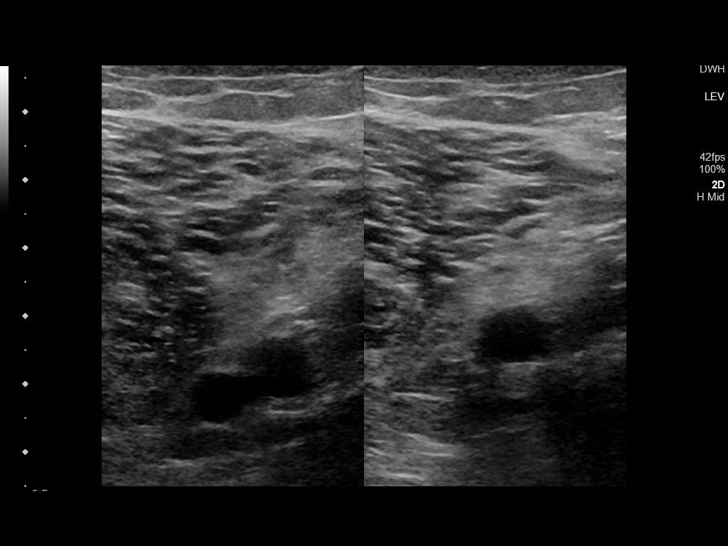
[im 11/61]
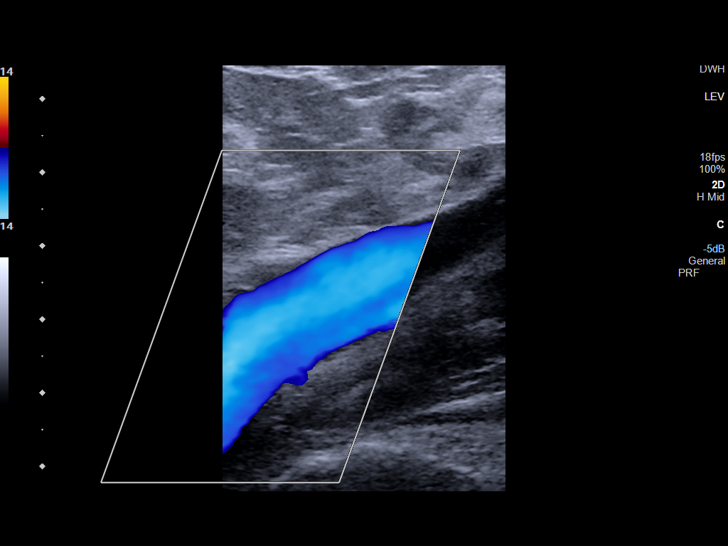
[im 16/61]
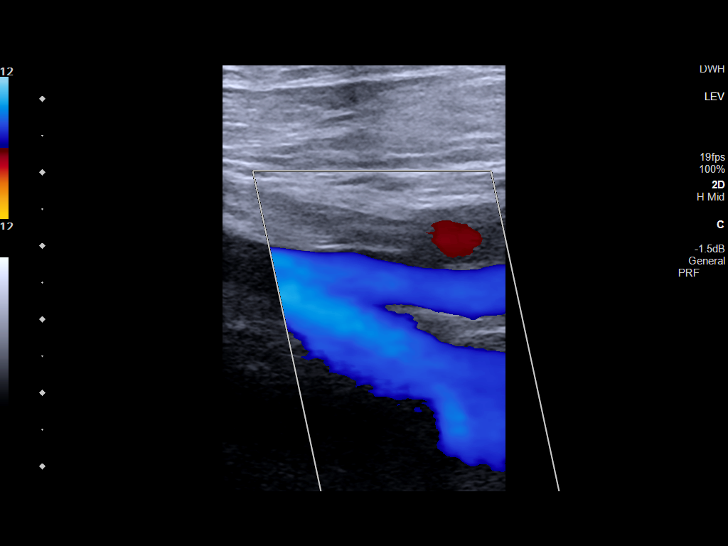
[im 19/61]
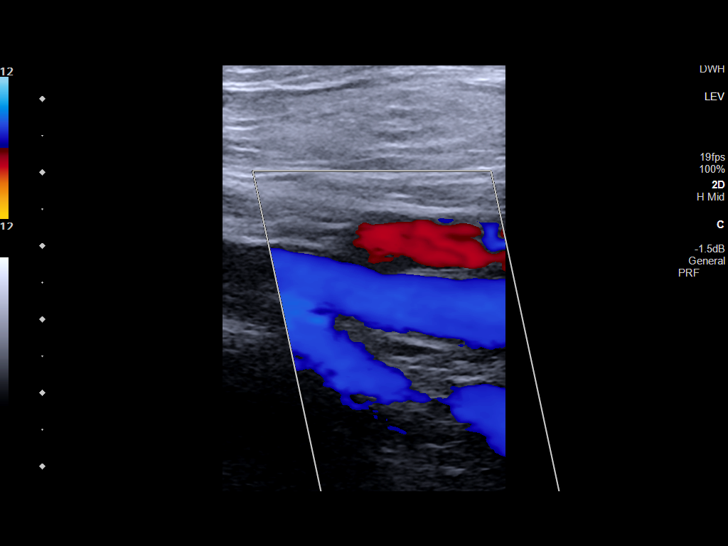
[im 24/61]
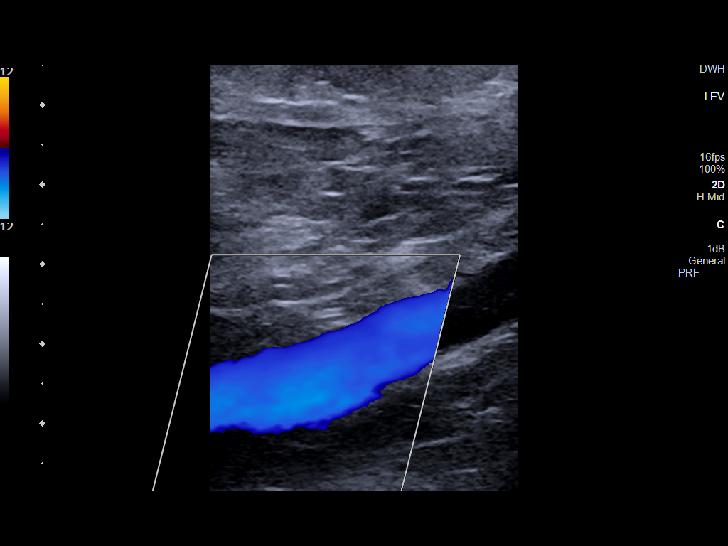
[im 29/61]
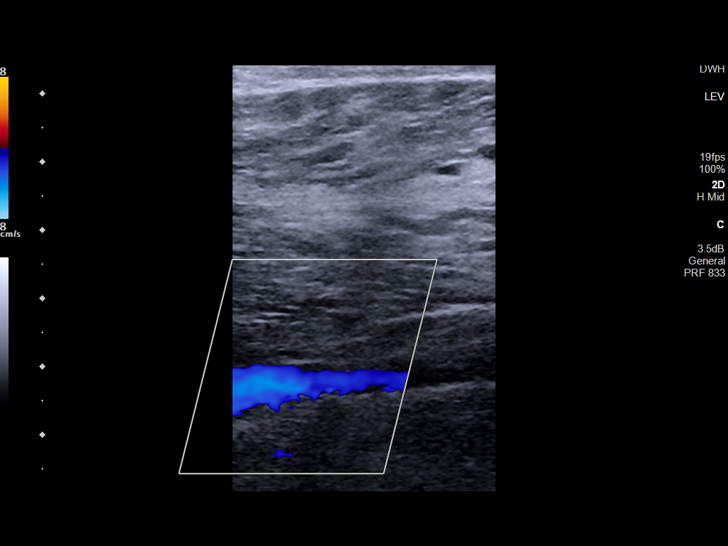
[im 32/61]
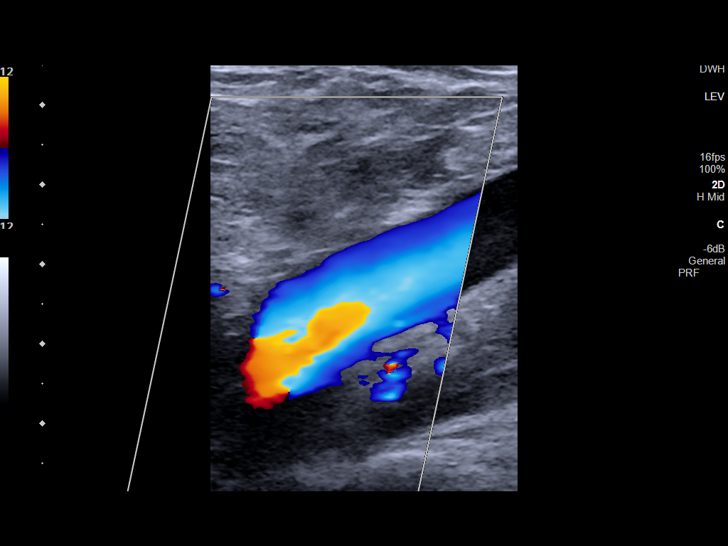
[im 37/61]
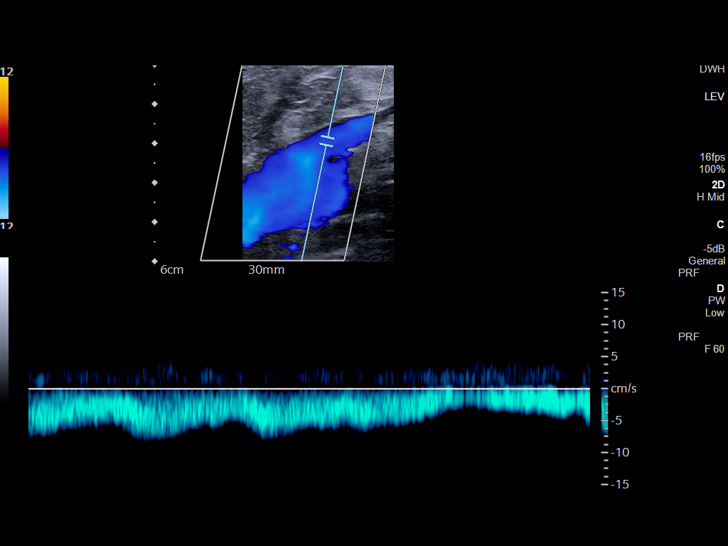
[im 42/61]
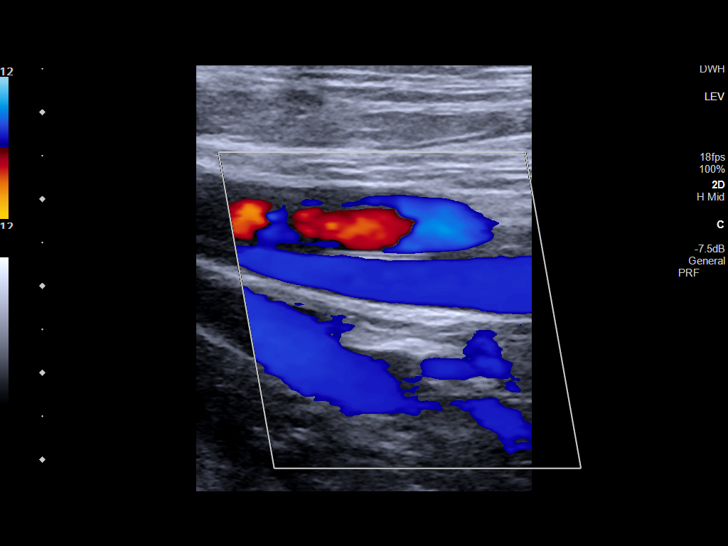
[im 47/61]
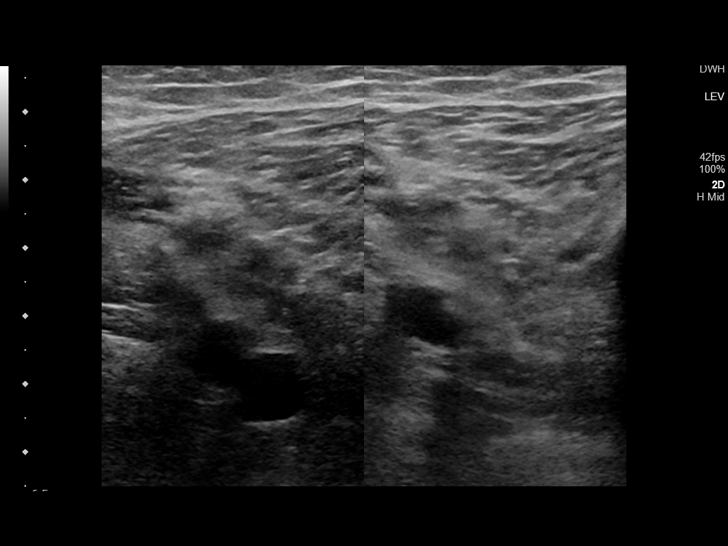
[im 50/61]
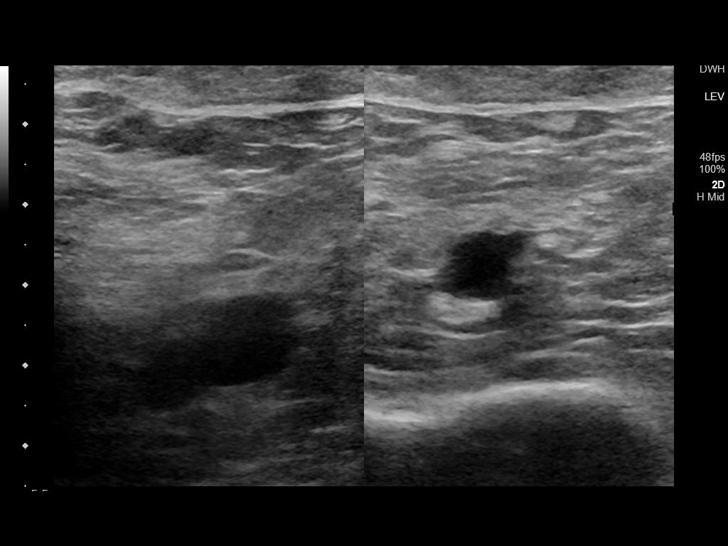
[im 55/61]
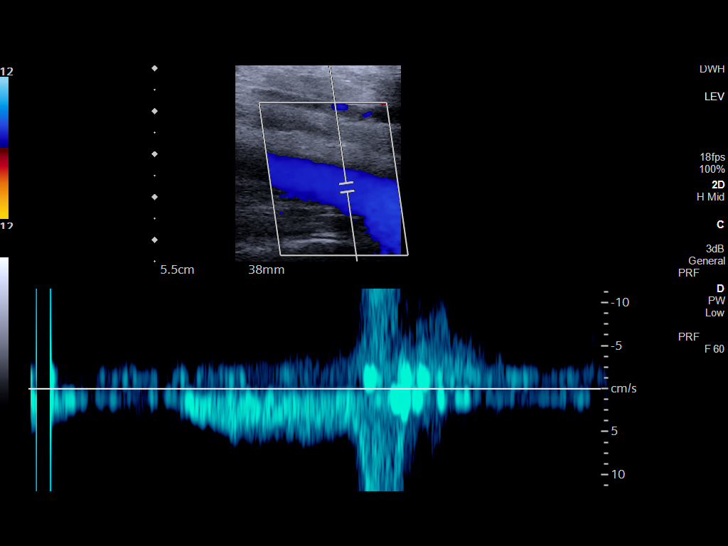
[im 61/61]
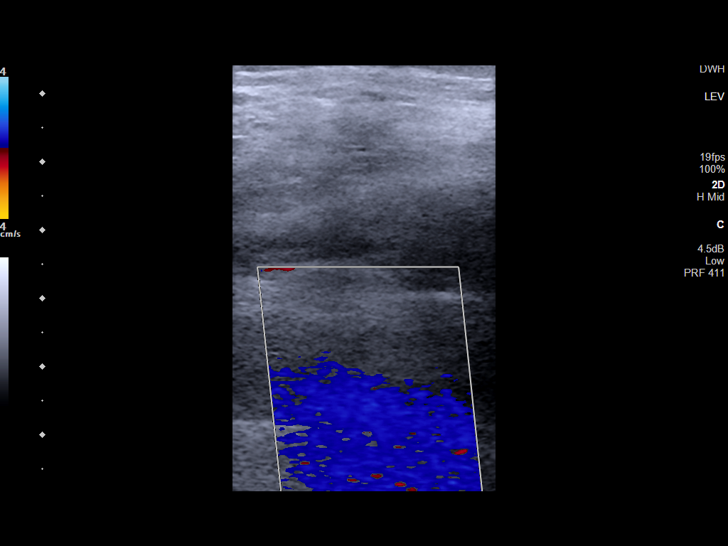

[14 of 24 positions shown; findings below may reference images not displayed]

FINDINGS: VENOUS

Normal compressibility of the common femoral, superficial femoral,
and popliteal veins, as well as the visualized calf veins.
Visualized portions of profunda femoral vein and great saphenous
vein unremarkable. No filling defects to suggest DVT on grayscale or
color Doppler imaging. Doppler waveforms show normal direction of
venous flow, normal respiratory plasticity and response to
augmentation.

OTHER

None.

Limitations: none
IMPRESSION: Negative.

## 2022-11-10 IMAGING — DX DG CHEST 1V PORT
1 series · 1 of 1 positions shown · non-contrast
Comparison: None.

CLINICAL DATA: Hyponatremia

EXAM:
PORTABLE CHEST 1 VIEW

[chest ap]
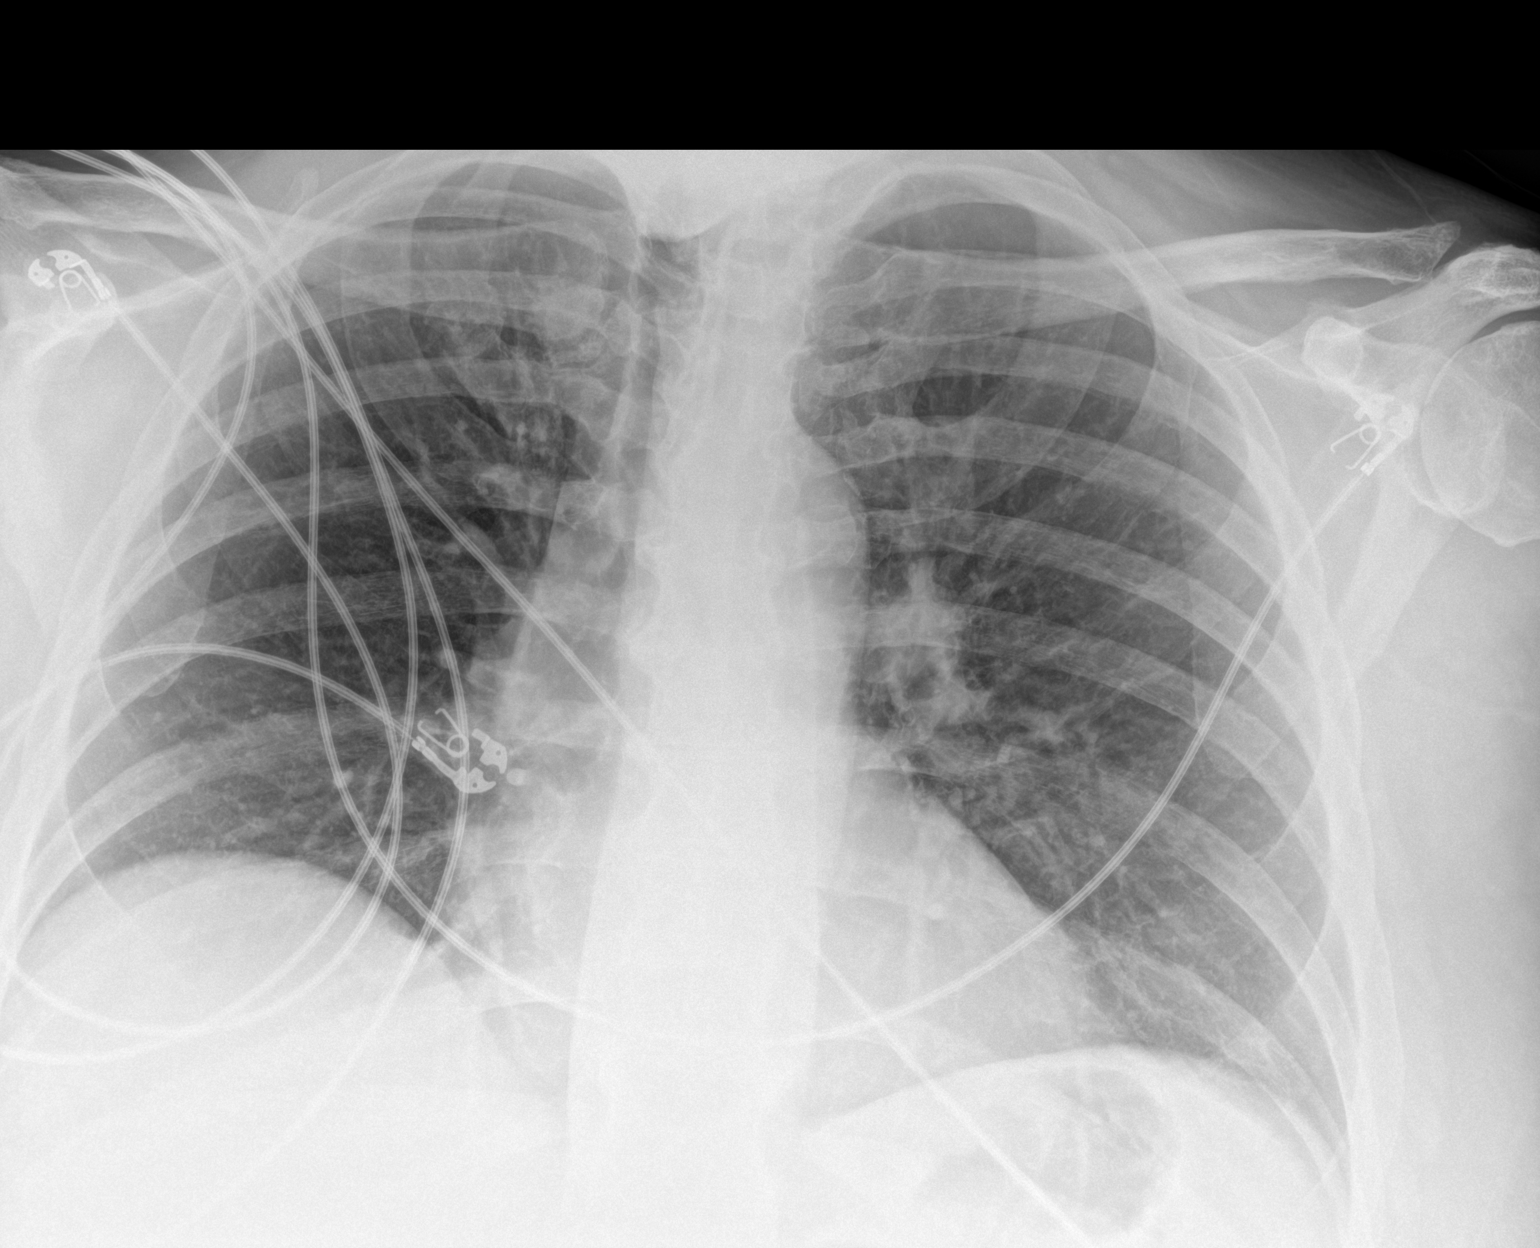

[1 of 1 positions shown; findings below may reference images not displayed]

FINDINGS: The heart size and mediastinal contours are within normal limits.
Both lungs are clear. The visualized skeletal structures are
unremarkable.
IMPRESSION: No active disease.

## 2022-12-17 IMAGING — CR DG CHEST 2V
2 series · 2 of 2 positions shown · non-contrast
Comparison: 06/14/2021

CLINICAL DATA: Chest pain

EXAM:
CHEST - 2 VIEW

[chest pa]
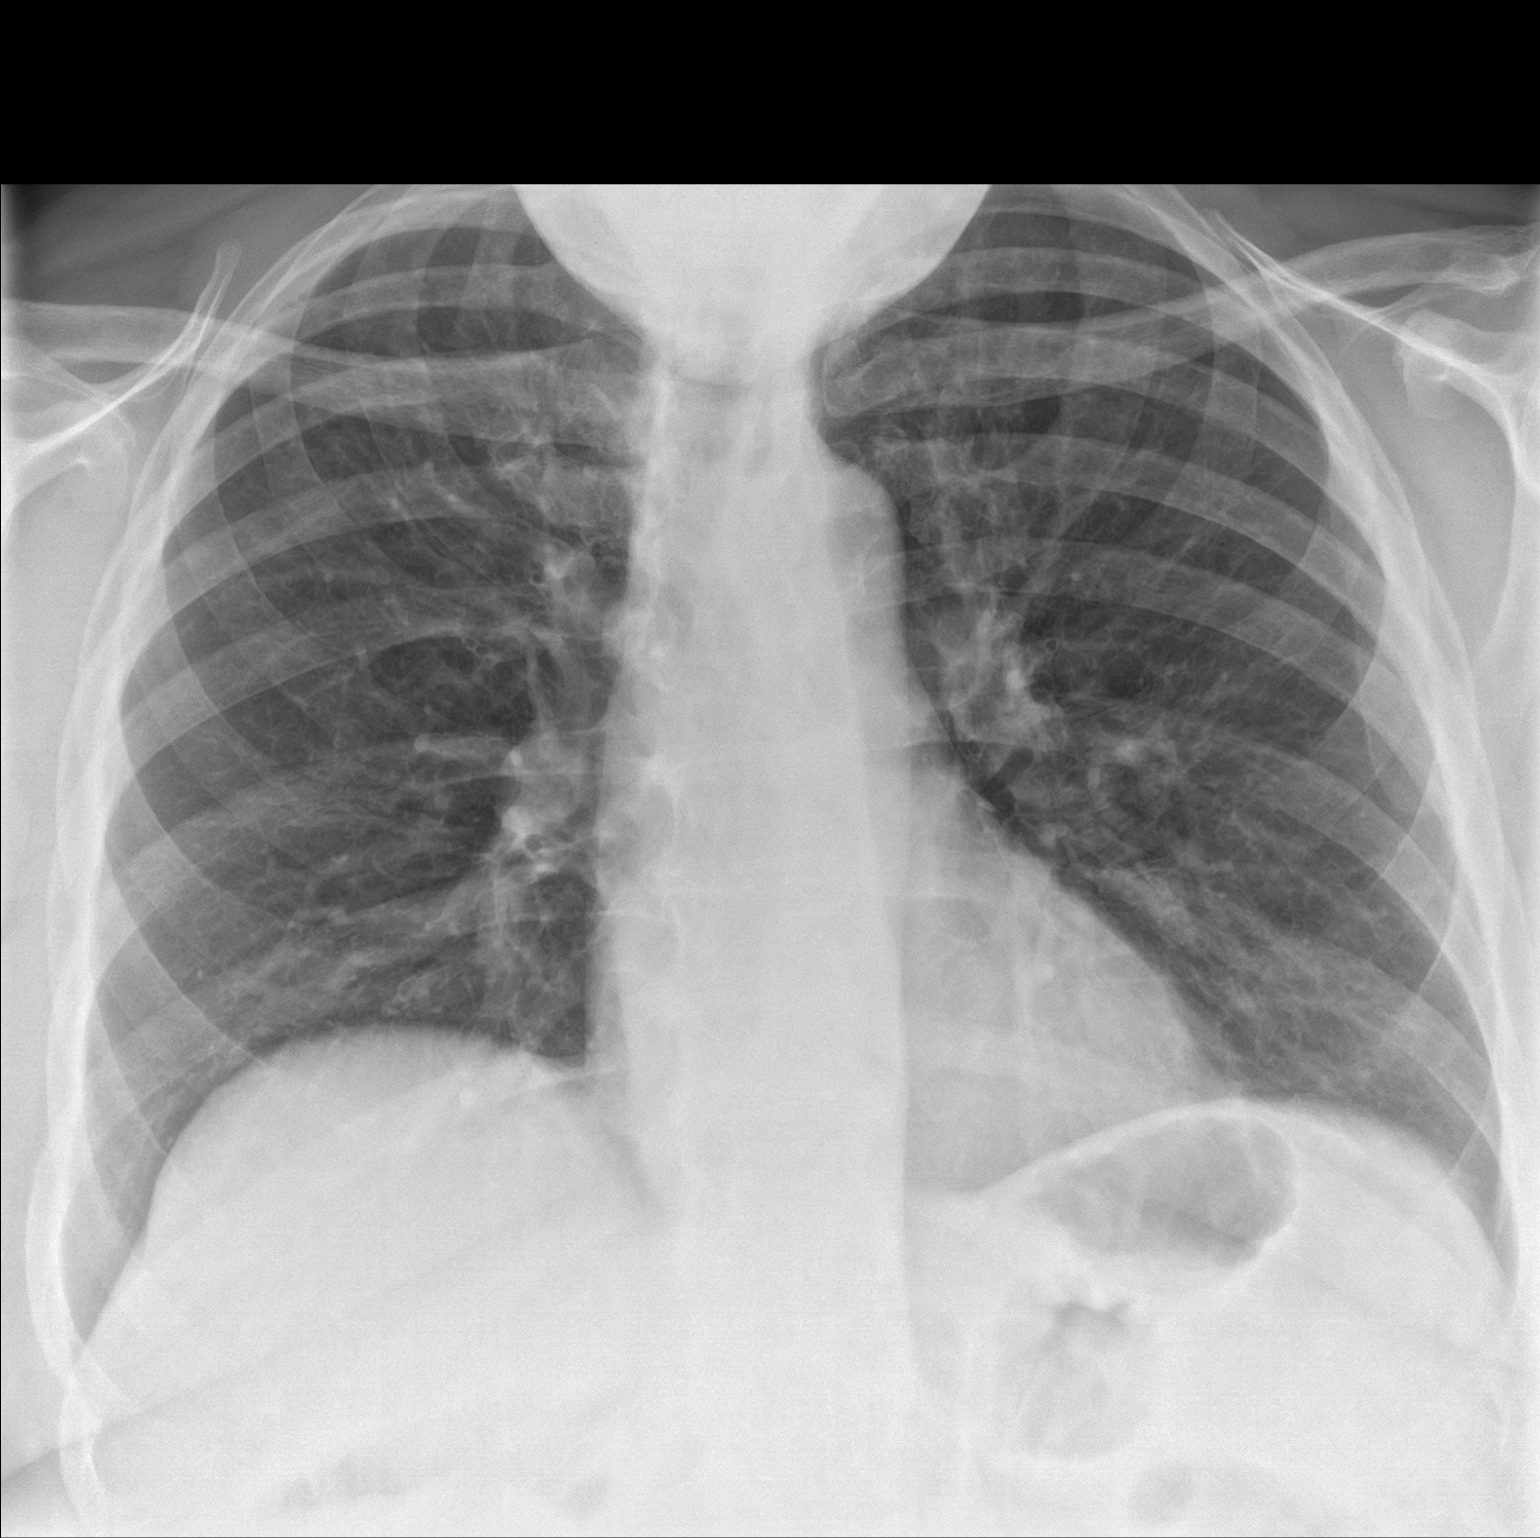

[chest lat]
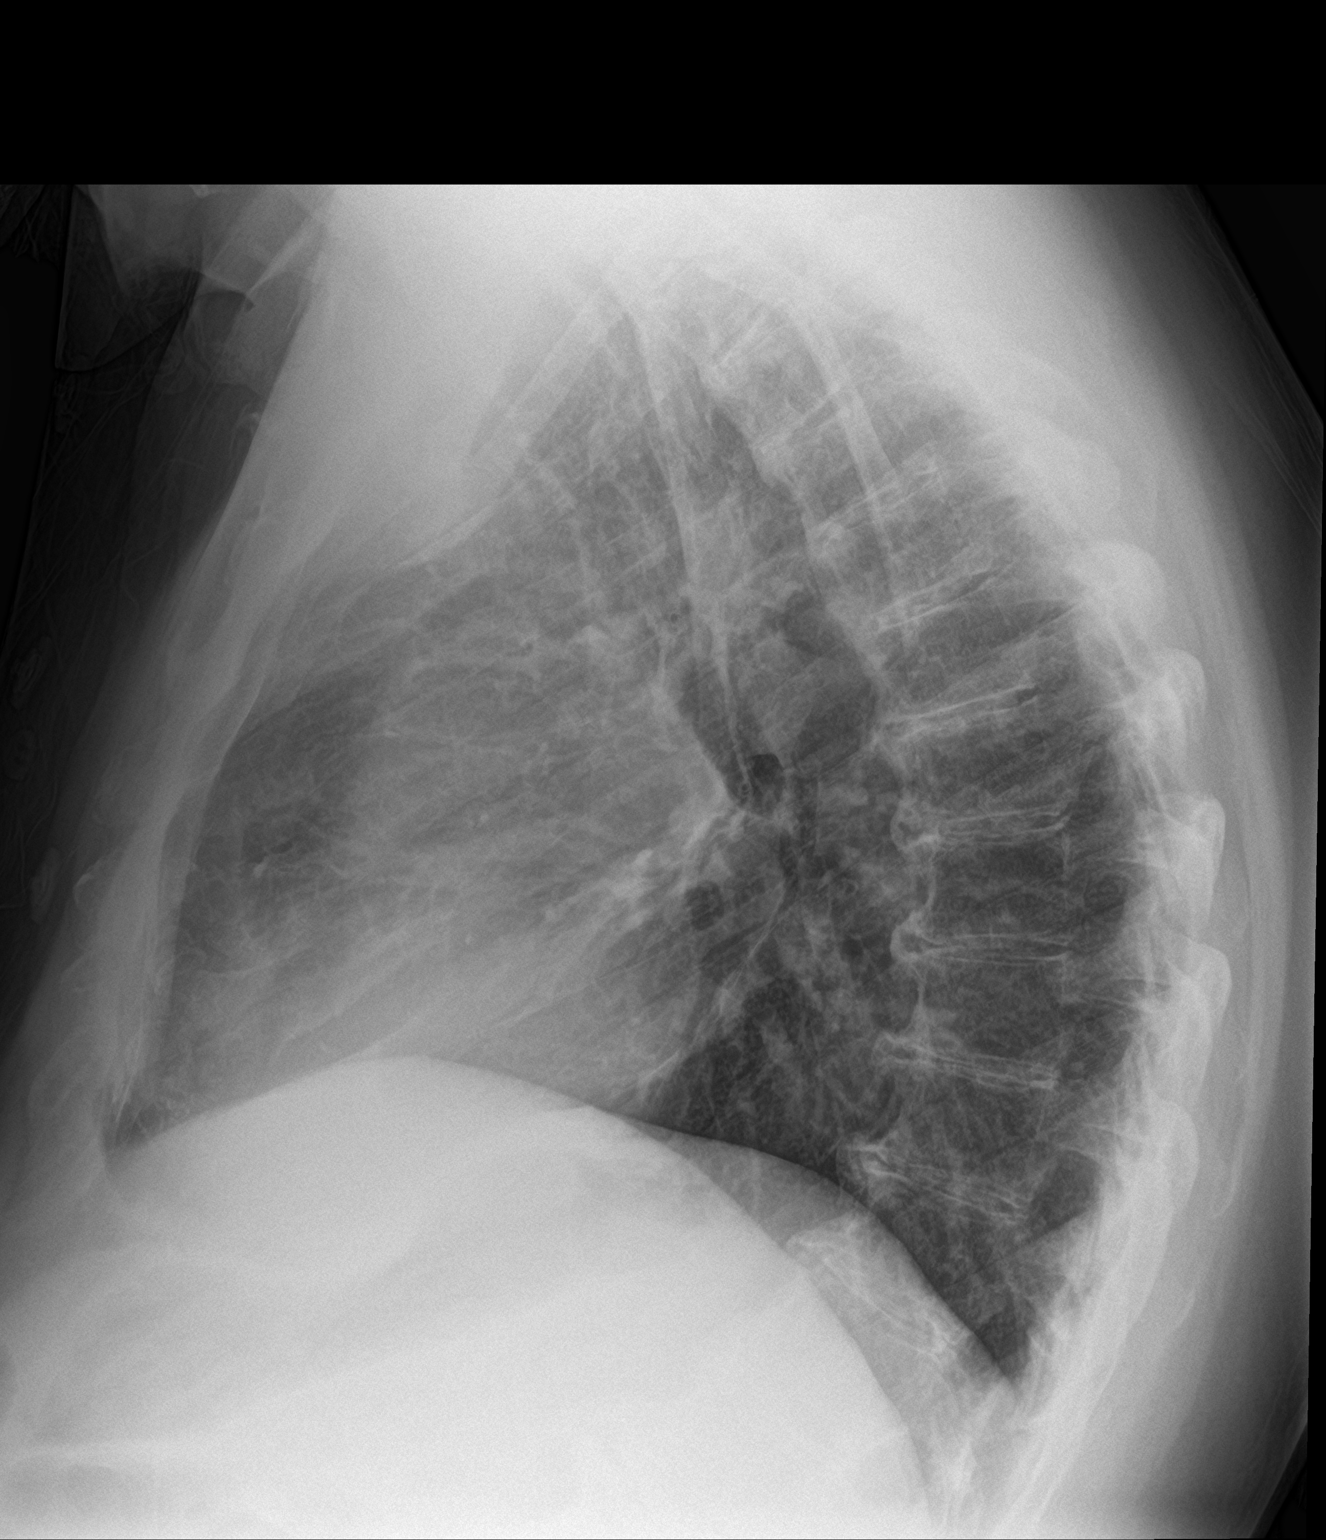

[2 of 2 positions shown; findings below may reference images not displayed]

FINDINGS: Heart and mediastinal contours are within normal limits. No focal
opacities or effusions. No acute bony abnormality.
IMPRESSION: No active cardiopulmonary disease.

## 2023-01-30 ENCOUNTER — Inpatient Hospital Stay
Admission: EM | Admit: 2023-01-30 | Discharge: 2023-02-05 | DRG: 641 | Disposition: A | Discharge status: Home / Self Care | Payer: Medicare Other | Attending: Internal Medicine | Admitting: Internal Medicine

## 2023-01-30 ENCOUNTER — Other Ambulatory Visit: Payer: Self-pay

## 2023-01-30 DIAGNOSIS — R631 Polydipsia: Secondary | ICD-10-CM | POA: Diagnosis present

## 2023-01-30 DIAGNOSIS — F54 Psychological and behavioral factors associated with disorders or diseases classified elsewhere: Secondary | ICD-10-CM | POA: Diagnosis not present

## 2023-01-30 DIAGNOSIS — D509 Iron deficiency anemia, unspecified: Secondary | ICD-10-CM | POA: Diagnosis present

## 2023-01-30 DIAGNOSIS — E871 Hypo-osmolality and hyponatremia: Secondary | ICD-10-CM | POA: Diagnosis not present

## 2023-01-30 DIAGNOSIS — K5900 Constipation, unspecified: Secondary | ICD-10-CM | POA: Diagnosis present

## 2023-01-30 DIAGNOSIS — Z79899 Other long term (current) drug therapy: Secondary | ICD-10-CM

## 2023-01-30 DIAGNOSIS — F845 Asperger's syndrome: Secondary | ICD-10-CM | POA: Diagnosis present

## 2023-01-30 DIAGNOSIS — I2510 Atherosclerotic heart disease of native coronary artery without angina pectoris: Secondary | ICD-10-CM | POA: Diagnosis present

## 2023-01-30 DIAGNOSIS — Z87891 Personal history of nicotine dependence: Secondary | ICD-10-CM

## 2023-01-30 DIAGNOSIS — I10 Essential (primary) hypertension: Secondary | ICD-10-CM | POA: Diagnosis present

## 2023-01-30 DIAGNOSIS — Z7901 Long term (current) use of anticoagulants: Secondary | ICD-10-CM

## 2023-01-30 DIAGNOSIS — R109 Unspecified abdominal pain: Secondary | ICD-10-CM | POA: Diagnosis present

## 2023-01-30 DIAGNOSIS — E11649 Type 2 diabetes mellitus with hypoglycemia without coma: Secondary | ICD-10-CM | POA: Diagnosis present

## 2023-01-30 DIAGNOSIS — J45909 Unspecified asthma, uncomplicated: Secondary | ICD-10-CM | POA: Diagnosis present

## 2023-01-30 DIAGNOSIS — Z8614 Personal history of Methicillin resistant Staphylococcus aureus infection: Secondary | ICD-10-CM

## 2023-01-30 DIAGNOSIS — E119 Type 2 diabetes mellitus without complications: Secondary | ICD-10-CM

## 2023-01-30 DIAGNOSIS — I700 Atherosclerosis of aorta: Secondary | ICD-10-CM | POA: Diagnosis present

## 2023-01-30 DIAGNOSIS — I7300 Raynaud's syndrome without gangrene: Secondary | ICD-10-CM | POA: Diagnosis present

## 2023-01-30 DIAGNOSIS — K219 Gastro-esophageal reflux disease without esophagitis: Secondary | ICD-10-CM | POA: Diagnosis present

## 2023-01-30 DIAGNOSIS — K429 Umbilical hernia without obstruction or gangrene: Secondary | ICD-10-CM | POA: Diagnosis present

## 2023-01-30 DIAGNOSIS — I878 Other specified disorders of veins: Secondary | ICD-10-CM | POA: Diagnosis present

## 2023-01-30 DIAGNOSIS — K30 Functional dyspepsia: Secondary | ICD-10-CM | POA: Diagnosis present

## 2023-01-30 DIAGNOSIS — I4891 Unspecified atrial fibrillation: Secondary | ICD-10-CM | POA: Diagnosis present

## 2023-01-30 LAB — CBG MONITORING, ED: Glucose-Capillary: 69 mg/dL — ABNORMAL LOW (ref 70–99)

## 2023-01-30 LAB — SODIUM: Sodium: 120 mmol/L — ABNORMAL LOW (ref 135–145)

## 2023-01-30 LAB — TSH: TSH: 2.367 u[IU]/mL (ref 0.350–4.500)

## 2023-01-30 MED ORDER — SODIUM CHLORIDE 0.9% FLUSH: 3.0000 mL | INTRAVENOUS | Status: DC | PRN

## 2023-01-30 MED ORDER — SODIUM CHLORIDE 0.9 % IV SOLN
250.0000 mL | INTRAVENOUS | Status: DC | PRN
  Administered 2023-01-31: 250 mL via INTRAVENOUS

## 2023-01-30 MED ORDER — ALBUTEROL SULFATE (2.5 MG/3ML) 0.083% IN NEBU: 2.5000 mg | INHALATION_SOLUTION | Freq: Four times a day (QID) | RESPIRATORY_TRACT | Status: DC | PRN

## 2023-01-30 MED ORDER — LISINOPRIL 10 MG PO TABS
10.0000 mg | ORAL_TABLET | Freq: Every day | ORAL | Status: DC
  Administered 2023-01-31 – 2023-02-05 (×6): 10 mg via ORAL
  Filled 2023-01-30 (×6): qty 1

## 2023-01-30 MED ORDER — ACETAMINOPHEN 650 MG RE SUPP: 650.0000 mg | Freq: Four times a day (QID) | RECTAL | Status: DC | PRN

## 2023-01-30 MED ORDER — APIXABAN 5 MG PO TABS
5.0000 mg | ORAL_TABLET | Freq: Two times a day (BID) | ORAL | Status: DC
  Administered 2023-01-31 – 2023-02-02 (×6): 5 mg via ORAL
  Filled 2023-01-30 (×7): qty 1

## 2023-01-30 MED ORDER — MORPHINE SULFATE (PF) 2 MG/ML IV SOLN
2.0000 mg | INTRAVENOUS | Status: DC | PRN
  Filled 2023-01-30: qty 1

## 2023-01-30 MED ORDER — CARVEDILOL 3.125 MG PO TABS
6.2500 mg | ORAL_TABLET | Freq: Two times a day (BID) | ORAL | Status: DC
  Administered 2023-01-31 – 2023-02-02 (×5): 6.25 mg via ORAL
  Filled 2023-01-30 (×5): qty 2

## 2023-01-30 MED ORDER — ACETAMINOPHEN 325 MG PO TABS
650.0000 mg | ORAL_TABLET | Freq: Four times a day (QID) | ORAL | Status: DC | PRN
  Administered 2023-01-31: 650 mg via ORAL
  Filled 2023-01-30 (×2): qty 2

## 2023-01-30 MED ORDER — MONTELUKAST SODIUM 10 MG PO TABS
10.0000 mg | ORAL_TABLET | Freq: Every day | ORAL | Status: DC
  Administered 2023-01-31 – 2023-02-04 (×5): 10 mg via ORAL
  Filled 2023-01-30 (×5): qty 1

## 2023-01-30 MED ORDER — PANTOPRAZOLE SODIUM 40 MG PO TBEC
40.0000 mg | DELAYED_RELEASE_TABLET | Freq: Every day | ORAL | Status: DC
  Administered 2023-01-31 – 2023-02-05 (×6): 40 mg via ORAL
  Filled 2023-01-30 (×6): qty 1

## 2023-01-30 MED ORDER — SODIUM CHLORIDE 1 G PO TABS
1.0000 g | ORAL_TABLET | Freq: Three times a day (TID) | ORAL | Status: DC
  Administered 2023-01-31 – 2023-02-05 (×17): 1 g via ORAL
  Filled 2023-01-30 (×17): qty 1

## 2023-01-30 MED ORDER — FLECAINIDE ACETATE 100 MG PO TABS
100.0000 mg | ORAL_TABLET | Freq: Two times a day (BID) | ORAL | Status: DC
  Administered 2023-01-31 – 2023-02-05 (×11): 100 mg via ORAL
  Filled 2023-01-30 (×11): qty 1

## 2023-01-30 MED ORDER — ATENOLOL 25 MG PO TABS
25.0000 mg | ORAL_TABLET | Freq: Two times a day (BID) | ORAL | Status: DC
  Administered 2023-01-31 – 2023-02-05 (×9): 25 mg via ORAL
  Filled 2023-01-30 (×11): qty 1

## 2023-01-30 MED ORDER — SODIUM CHLORIDE 0.9% FLUSH
3.0000 mL | Freq: Two times a day (BID) | INTRAVENOUS | Status: DC
  Administered 2023-01-31 – 2023-02-04 (×10): 3 mL via INTRAVENOUS

## 2023-01-30 MED ORDER — SODIUM CHLORIDE 0.9% FLUSH
3.0000 mL | Freq: Two times a day (BID) | INTRAVENOUS | Status: DC
  Administered 2023-02-01 – 2023-02-02 (×3): 3 mL via INTRAVENOUS

## 2023-01-30 NOTE — Assessment & Plan Note (Signed)
Pt states he has raynaud's and his fingertips always turn blue in cold weather and he warm his hands and they become normal.

## 2023-01-30 NOTE — ED Triage Notes (Signed)
Pt states his new BP meds have caused him stomach pain, which he states caused  dehydration and made the abdominal pain worse x1 week. Pt reports similar 4/5 x's.

## 2023-01-30 NOTE — H&P (Signed)
History and Physical    Patient: Carl [name] ZOX:[id number] DOB: May 26, 1951 DOA: 01/30/2023 DOS: the patient was seen and examined on 01/30/2023 PCP: Gracelyn Nurse, MD  Patient coming from: Home  Chief Complaint:  Chief Complaint  Patient presents with   Abdominal Pain   hyponatremia    HPI: Carl [name] is a 72 y.o. 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  Patient has been instructed to for fluid restrictions in the past and has done well.  His sodium level today is moderately low and has been as low as 109.  Patient also has past medical history of asthma, A-fib on anticoagulation with Eliquis, essential hypertension, presenting today with generalized abdominal pain.  Patient reports several months of intermittent abdominal pain when he eats and that is why he drinks so much water and Gatorade before eating.  Patient also reports thirst.  No reports of chest pain shortness of breath palpitation lower extremity edema nausea vomiting fevers or chills.  Sent by primary care today foras patient has a history of hyponatremia. Vitals are stable today blood pressure is 145/68 and he is afebrile O2 sats of 99% on room air.   Review of Systems: Review of Systems  Endo/Heme/Allergies:  Positive for polydipsia.  All other systems reviewed and are negative.    Past Medical History:  Diagnosis Date   Anemia    Asperger's syndrome    Asthma    Atrial fibrillation (HCC)    Diverticulosis    GERD (gastroesophageal reflux disease)    History of MRSA infection    Hypertension    Hyponatremia    Psoriasis    Valvular heart disease    Past Surgical History:  Procedure Laterality Date   CARDIOVERSION N/A 02/27/2022   Procedure: CARDIOVERSION;  Surgeon: Lamar Blinks, MD;  Location: [facility] ORS;  Service: Cardiovascular;  Laterality: N/A;   COLONOSCOPY     COLONOSCOPY WITH  PROPOFOL N/A 04/23/2018   Procedure: COLONOSCOPY WITH PROPOFOL;  Surgeon: Christena Deem, MD;  Location: [facility] ENDOSCOPY;  Service: Endoscopy;  Laterality: N/A;   Social History:  reports that he has never smoked. He has never used smokeless tobacco. He reports that he does not currently use alcohol. He reports that he does not use drugs.  No Known Allergies  History reviewed. No pertinent family history.  Prior to Admission medications   Medication Sig Start Date End Date Taking? Authorizing Provider  albuterol (VENTOLIN HFA) 108 (90 Base) MCG/ACT inhaler Inhale 2 puffs into the lungs every 6 (six) hours as needed for wheezing. 11/18/18   [provider]  amLODipine (NORVASC) 5 MG tablet Take 5 mg by mouth daily.    [provider]  Apixaban (ELIQUIS PO) Take 5 mg by mouth 2 (two) times daily.    [provider]  ascorbic acid (VITAMIN C) 1000 MG tablet Take 1,000 mg by mouth daily.    [provider]  atenolol (TENORMIN) 25 MG tablet Take 25 mg by mouth 2 (two) times daily.    [provider]  B Complex Vitamins (B COMPLEX PO) Take 1 tablet by mouth daily.    [provider]  carvedilol (COREG) 6.25 MG tablet Take 1 tablet (6.25 mg total) by mouth 2 (two) times daily with a meal. 10/15/21   Enedina Finner, MD  cetirizine (ZYRTEC) 10 MG tablet Take 10 mg by mouth daily.    [provider]  flecainide (  TAMBOCOR) 100 MG tablet Take 100 mg by mouth 2 (two) times daily.    [provider]  folic acid (FOLVITE) 1 MG tablet Take 1 mg by mouth daily.    [provider]  furosemide (LASIX) 20 MG tablet Take 1 tablet (20 mg total) by mouth 2 (two) times daily. 06/20/22 06/20/23  Hollice Espy, MD  Garlic 100 MG TABS Take 100 mg by mouth daily.    [provider]  hyoscyamine (LEVBID) 0.375 MG 12 hr tablet Take 1 tablet by mouth in the morning and at bedtime. 07/26/20   [provider]  lisinopril  (PRINIVIL,ZESTRIL) 10 MG tablet Take 10 mg by mouth daily.    [provider]  montelukast (SINGULAIR) 10 MG tablet Take 10 mg by mouth at bedtime.    [provider]  Multiple Vitamin (MULTIVITAMIN) tablet Take 1 tablet by mouth daily.    [provider]  Omega-3 Fatty Acids (SALMON OIL PO) Take 1 capsule by mouth daily.    [provider]  pantoprazole (PROTONIX) 40 MG tablet Take 40 mg by mouth daily.    [provider]  Turmeric (QC TUMERIC COMPLEX PO) Take 1 tablet by mouth daily.    [provider]  urea (URE-NA) 15 g PACK oral packet Take 15 g by mouth 2 (two) times daily. 06/20/22   Hollice Espy, MD  Zinc Citrate-Phytase 25-500 MG CAPS Take 1 capsule by mouth daily.    [provider]     Vitals:   01/30/23 1924  BP: (!) 145/68  Pulse: 66  Resp: 18  Temp: 98.2 F (36.8 C)  TempSrc: Oral  SpO2: 99%   Physical Exam Vitals and nursing note reviewed.  Constitutional:      General: He is not in acute distress. HENT:     Head: Normocephalic and atraumatic.     Right Ear: Hearing normal.     Left Ear: Hearing normal.     Nose: Nose normal. No nasal deformity.     Mouth/Throat:     Lips: Pink.     Tongue: No lesions.     Pharynx: Oropharynx is clear.  Eyes:     General: Lids are normal.     Extraocular Movements: Extraocular movements intact.  Cardiovascular:     Rate and Rhythm: Normal rate and regular rhythm.     Heart sounds: Normal heart sounds.  Pulmonary:     Effort: Pulmonary effort is normal.     Breath sounds: Normal breath sounds.  Abdominal:     General: Bowel sounds are normal. There is no distension.     Palpations: Abdomen is soft. There is no mass.     Tenderness: There is no abdominal tenderness.  Musculoskeletal:     Right lower leg: No edema.     Left lower leg: No edema.  Skin:    General: Skin is warm.  Neurological:     General: No focal deficit present.     Mental Status:  He is alert and oriented to person, place, and time.     Cranial Nerves: Cranial nerves 2-12 are intact.  Psychiatric:        Attention and Perception: Attention normal.        Mood and Affect: Mood normal.        Speech: Speech normal.        Behavior: Behavior normal. Behavior is cooperative.      Labs on Admission: I have personally reviewed  following labs and imaging studies  CBC: No results for input(s): "WBC", "NEUTROABS", "HGB", "HCT", "MCV", "PLT" in the last 168 hours. Basic Metabolic Panel: Recent Labs  Lab 01/30/23 2145  NA 120*   GFR: CrCl cannot be calculated (Patient's most recent lab result is older than the maximum 21 days allowed.). Liver Function Tests: No results for input(s): "AST", "ALT", "ALKPHOS", "BILITOT", "PROT", "ALBUMIN" in the last 168 hours. No results for input(s): "LIPASE", "AMYLASE" in the last 168 hours. No results for input(s): "AMMONIA" in the last 168 hours. Coagulation Profile: No results for input(s): "INR", "PROTIME" in the last 168 hours. Cardiac Enzymes: No results for input(s): "CKTOTAL", "CKMB", "CKMBINDEX", "TROPONINI" in the last 168 hours. BNP (last 3 results) No results for input(s): "PROBNP" in the last 8760 hours. HbA1C: No results for input(s): "HGBA1C" in the last 72 hours. CBG: Recent Labs  Lab 01/30/23 2144  GLUCAP 69*   Lipid Profile: No results for input(s): "CHOL", "HDL", "LDLCALC", "TRIG", "CHOLHDL", "LDLDIRECT" in the last 72 hours. Thyroid Function Tests: Recent Labs    01/30/23 2145  TSH 2.367   Anemia Panel: No results for input(s): "VITAMINB12", "FOLATE", "FERRITIN", "TIBC", "IRON", "RETICCTPCT" in the last 72 hours. Urine analysis:    Component Value Date/Time   COLORURINE COLORLESS (A) 10/08/2021 2104   APPEARANCEUR CLEAR (A) 10/08/2021 2104   LABSPEC 1.000 (L) 10/08/2021 2104   PHURINE 8.0 10/08/2021 2104   GLUCOSEU >=500 (A) 10/08/2021 2104   HGBUR SMALL (A) 10/08/2021 2104   BILIRUBINUR  NEGATIVE 10/08/2021 2104   KETONESUR NEGATIVE 10/08/2021 2104   PROTEINUR NEGATIVE 10/08/2021 2104   NITRITE NEGATIVE 10/08/2021 2104   LEUKOCYTESUR NEGATIVE 10/08/2021 2104    Radiological Exams on Admission: No results found.   Data Reviewed: Relevant notes from primary care and specialist visits, past discharge summaries as available in EHR, including Care Everywhere. 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  Will start patient on sodium chloride tablets and continue regular diet and fluid restrict patient.  Atrial fibrillation (HCC) Continue patient on Eliquis. Continue patient on Lasix Coreg atenolol and flecainide. Monitor electrolytes and replace as needed. Monitor patient on telemetry unit.  Hypertension Continue patient on Coreg and atenolol and lisinopril Will hold patient's amlodipine and Lasix .      Type 2 diabetes mellitus without complication, without long-term current use of insulin (HCC)  consistent carb diet/glycemic protocol.  Raynaud's disease Pt states he has raynaud's and his fingertips always turn blue in cold weather and he warm his hands and they become normal.    Abdominal pain We will get labs and obtain CTA abd / pelvis as he is having abdominal pain  and is also anemic.  Lipase pending. Stool occult pending.   GERD (gastroesophageal reflux disease) Continue protonix.   Asthma Stable.  Albuterol as needed and continue Singulair.   Unresulted Labs (From admission,  onward)     Start     Ordered   01/31/23 0500  Comprehensive metabolic panel  Tomorrow morning,   R        01/30/23 2244   01/31/23 0500  CBC  Tomorrow  morning,   R        01/30/23 2244   01/30/23 2256  Brain natriuretic peptide  Add-on,   AD        01/30/23 2255   01/30/23 2249  Occult blood card to lab, stool RN will collect  Daily,   R     Question:  Specimen to be collected by:  Answer:  RN will collect   01/30/23 2248   01/30/23 2231  T4, free  Add-on,   AD        01/30/23 2248   01/30/23 2231  Magnesium  Add-on,   AD        01/30/23 2248   01/30/23 2230  Lipase, blood  Once,   R        01/30/23 2248   01/30/23 2230  Lactic acid, plasma  ONCE - STAT,   STAT        01/30/23 2248   01/30/23 2230  CK  Once,   R        01/30/23 2248   01/30/23 2230  Type and screen  Once,   R        01/30/23 2248             DVT prophylaxis:  Eliquis   Consults:  None   Advance Care Planning:    Code Status: Full Code   Family Communication:  None   Disposition Plan:  Back to previous home environment  Severity of Illness: The appropriate patient status for this patient is INPATIENT. Inpatient status is judged to be reasonable and necessary in order to provide the required intensity of service to ensure the patient's safety. The patient's presenting symptoms, physical exam findings, and initial radiographic and laboratory data in the context of their chronic comorbidities is felt to place them at high risk for further clinical deterioration. Furthermore, it is not anticipated that the patient will be medically stable for discharge from the hospital within 2 midnights of admission.   * I certify that at the point of admission it is my clinical judgment that the patient will require inpatient hospital care spanning beyond 2 midnights from the point of admission due to high intensity of service, high risk for further deterioration and high frequency of surveillance  required.*  Author: Gertha Calkin, MD 01/30/2023 11:26 PM  For on call review [email or web address].

## 2023-01-30 NOTE — Assessment & Plan Note (Signed)
Stable.  Albuterol as needed and continue Singulair.

## 2023-01-30 NOTE — Assessment & Plan Note (Addendum)
consistent carb diet/glycemic protocol.

## 2023-01-30 NOTE — Assessment & Plan Note (Signed)
Continue patient on Coreg and atenolol and lisinopril Will hold patient's amlodipine and Lasix .

## 2023-01-30 NOTE — ED Provider Notes (Signed)
[facility] Provider Note    Event Date/Time   First MD Initiated Contact with Patient 01/30/23 2117     (approximate)   History   Abdominal Pain and hyponatremia   HPI  Carl [name] is a 72 y.o. male    Patient reports that he saw his doctor today because he for the last several months has had intermittent discomfort in his abdomen when he eats.  He feels like he has to load up with water and Gatorade before eating because it helps him feel better.  He also reports his medication makes him thirsty and he will have to drink 3 or 4 glasses of water every hour.  No chest pain or trouble breathing.  He has not noticed any new swelling in his legs but has a history of swelling in his legs.  He is currently not having any pain or discomfort.  He would like something to eat.  Reports he does well except if he eats a big meal and then it will so upset his stomach or he thinks maybe his medications upset his stomach at times.  No vomiting no diarrhea  He knows that his doctor sent him here because his blood work showed a low sodium.  He tries to add sodium into his diet take sodium tablets, drinks Gatorade, Gatorade powder, and eat salty meals on a regular basis.  But he also reports drinking large amounts of water consistently    [facility] primary care note denotes hyponatremia generalized abdominal pain.  Sodium 119 referred to the ER.  Does have a history of psychogenic polydipsia    PMH A-fib (CMS/HHS-HCC)  Anemia  Asperger's syndrome (HHS-HCC)  Asthma without status asthmaticus (HHS-HCC)  Diverticulosis 08/08/05  GERD (gastroesophageal reflux disease)  Hx MRSA infection 10/2012  s/p groin abscess  Hypertension  Hyponatremia  Psoriasis  VHD (valvular heart disease)    Physical Exam   Triage Vital Signs: ED Triage Vitals  Enc Vitals Group     BP 01/30/23 1924 (!) 145/68     Pulse Rate 01/30/23 1924 66     Resp 01/30/23 1924 18     Temp  01/30/23 1924 98.2 F (36.8 C)     Temp Source 01/30/23 1924 Oral     SpO2 01/30/23 1924 99 %     Weight --      Height --      Head Circumference --      Peak Flow --      Pain Score 01/30/23 1922 1     Pain Loc --      Pain Edu? --      Excl. in GC? --     Most recent vital signs: Vitals:   01/30/23 1924  BP: (!) 145/68  Pulse: 66  Resp: 18  Temp: 98.2 F (36.8 C)  SpO2: 99%     General: Awake, no distress.  CV:  Good peripheral perfusion.  Normal tones and rate Resp:  Normal effort.  Clear bilateral Abd:  No distention.  Soft nontender nondistended throughout.  Umbilical hernia soft nontender Other:  Moderate bilateral lower extremity pitting edema with venous stasis changes   ED Results / Procedures / Treatments   Labs (all labs ordered are listed, but only abnormal results are displayed) Labs Reviewed  CBG MONITORING, ED - Abnormal; Notable for the following components:      Result Value   Glucose-Capillary 69 (*)    All other components within normal  limits  SODIUM  TSH  CBG MONITORING, ED    No active abdominal pain reassuring examination.  Patient reports that the discomfort he gets after taking his medications and eating has been chronic comes and goes waxing and remitting.  At this juncture in the emergency department I doubt acute intra-abdominal pathology such as obstruction infection appendicitis cholecystitis etc.   PROCEDURES:  Critical Care performed: No  Procedures   MEDICATIONS ORDERED IN ED: Medications - No data to display   IMPRESSION / MDM / ASSESSMENT AND PLAN / ED COURSE  I reviewed the triage vital signs and the nursing notes.                              Differential diagnosis includes, but is not limited to, hyponatremia likely due to diabetes insipidus based on the description he gives or thirst triggered by his medications including use of diuretic, which I do not think the patient has full knowledge or understanding of  the need for his diuretic.  He takes a diuretic but then follows it with large amounts of water and Gatorade on a regular basis drinking up to 3+ glasses of water hourly during the day  He has a history of hyponatremia.  I suspect at this point he appears to be hypervolemic.  Will fluid restrict.  He is currently eating saltine crackers his glucose slightly borderline low at 69, will give juice and crackers.  He is alert and oriented very pleasant in no distress abdomen soft nontender nondistended.  Does not report a consistent discomfort in the right upper abdomen  Patient's presentation is most consistent with acute complicated illness / injury requiring diagnostic workup.   ----------------------------------------- 10:17 PM on 01/30/2023 ----------------------------------------- Reviewed and interpreted labs from [facility] drawn today, all notable for hyponatremia sodium 119  Glucose 69, thereafter having saltine crackers and juice (small volume)  Consulted with, reviewed labs from [place] [facility], clinical history, and plan of care with Dr. Renaldo Reel who accepts patient for admission       FINAL CLINICAL IMPRESSION(S) / ED DIAGNOSES   Final diagnoses:  Hyponatremia  Polydipsia     Rx / DC Orders   ED Discharge Orders     None        Note:  This document was prepared using Dragon voice recognition software and may include unintentional dictation errors.   Sharyn Creamer, MD 01/30/23 2220

## 2023-01-30 NOTE — Assessment & Plan Note (Signed)
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  Will start patient on sodium chloride tablets and continue regular diet and fluid restrict patient.

## 2023-01-30 NOTE — Assessment & Plan Note (Signed)
We will get labs and obtain CTA abd / pelvis as he is having abdominal pain  and is also anemic.  Lipase pending. Stool occult pending.

## 2023-01-30 NOTE — ED Notes (Signed)
Pt given orange juice, 8 oz of water, and peanut butter and crackers. Pt A&O x4, advised that he did not want the blood pressure cuff on and requests that we only take his blood pressure when necessary.

## 2023-01-30 NOTE — Hospital Course (Signed)
Past medical history of

## 2023-01-30 NOTE — ED Notes (Signed)
Patient ambulatory to toilet independently. 

## 2023-01-30 NOTE — ED Notes (Signed)
First Nurse Note: Patient was brought here by [facility] . Patient c/o generalized abdominal pain. [facility] drew labs and sodium came back at 119. Patient has a history hyponatremia and took a sodium tablet today.

## 2023-01-30 NOTE — ED Notes (Signed)
Patient provided sandwich tray per request for food; able to feed self.

## 2023-01-30 NOTE — Assessment & Plan Note (Signed)
Continue patient on Eliquis. Continue patient on Lasix Coreg atenolol and flecainide. Monitor electrolytes and replace as needed. Monitor patient on telemetry unit.

## 2023-01-30 NOTE — Assessment & Plan Note (Signed)
Continue protonix  

## 2023-01-31 ENCOUNTER — Observation Stay: Payer: Medicare Other

## 2023-01-31 DIAGNOSIS — F54 Psychological and behavioral factors associated with disorders or diseases classified elsewhere: Secondary | ICD-10-CM | POA: Diagnosis not present

## 2023-01-31 DIAGNOSIS — R631 Polydipsia: Secondary | ICD-10-CM | POA: Diagnosis not present

## 2023-01-31 DIAGNOSIS — R1010 Upper abdominal pain, unspecified: Secondary | ICD-10-CM

## 2023-01-31 DIAGNOSIS — E871 Hypo-osmolality and hyponatremia: Secondary | ICD-10-CM | POA: Diagnosis not present

## 2023-01-31 LAB — COMPREHENSIVE METABOLIC PANEL
ALT: 24 U/L (ref 0–44)
AST: 20 U/L (ref 15–41)
Albumin: 3.8 g/dL (ref 3.5–5.0)
Alkaline Phosphatase: 64 U/L (ref 38–126)
Anion gap: 8 (ref 5–15)
BUN: 12 mg/dL (ref 8–23)
CO2: 25 mmol/L (ref 22–32)
Calcium: 8.6 mg/dL — ABNORMAL LOW (ref 8.9–10.3)
Chloride: 93 mmol/L — ABNORMAL LOW (ref 98–111)
Creatinine, Ser: 0.64 mg/dL (ref 0.61–1.24)
GFR, Estimated: >60 mL/min (ref >60)
Glucose, Bld: 174 mg/dL — ABNORMAL HIGH (ref 70–99)
Potassium: 4.2 mmol/L (ref 3.5–5.1)
Sodium: 122 mmol/L — ABNORMAL LOW (ref 135–145)
Total Bilirubin: 1 mg/dL (ref 0.3–1.2)
Total Protein: 6.6 g/dL (ref 6.5–8.1)

## 2023-01-31 LAB — CBC
HCT: 34.2 % — ABNORMAL LOW (ref 39.0–52.0)
Hemoglobin: 11.6 g/dL — ABNORMAL LOW (ref 13.0–17.0)
MCH: 27.6 pg (ref 26.0–34.0)
MCHC: 33.9 g/dL (ref 30.0–36.0)
MCV: 81.2 fL (ref 80.0–100.0)
Platelets: 212 10*3/uL (ref 150–400)
RBC: 4.21 MIL/uL — ABNORMAL LOW (ref 4.22–5.81)
RDW: 15.1 % (ref 11.5–15.5)
WBC: 6 10*3/uL (ref 4.0–10.5)
nRBC: 0 % (ref 0.0–0.2)

## 2023-01-31 LAB — LIPASE, BLOOD: Lipase: 43 U/L (ref 11–51)

## 2023-01-31 LAB — MAGNESIUM: Magnesium: 2.4 mg/dL (ref 1.7–2.4)

## 2023-01-31 LAB — LACTIC ACID, PLASMA: Lactic Acid, Venous: 2.1 mmol/L (ref 0.5–1.9)

## 2023-01-31 LAB — CK: Total CK: 160 U/L (ref 49–397)

## 2023-01-31 LAB — GLUCOSE, CAPILLARY: Glucose-Capillary: 160 mg/dL — ABNORMAL HIGH (ref 70–99)

## 2023-01-31 LAB — T4, FREE: Free T4: 0.86 ng/dL (ref 0.61–1.12)

## 2023-01-31 LAB — BRAIN NATRIURETIC PEPTIDE: B Natriuretic Peptide: 77.5 pg/mL (ref 0.0–100.0)

## 2023-01-31 LAB — CORTISOL-AM, BLOOD: Cortisol - AM: 11.7 ug/dL (ref 6.7–22.6)

## 2023-01-31 MED ORDER — PNEUMOCOCCAL 20-VAL CONJ VACC 0.5 ML IM SUSY
0.5000 mL | PREFILLED_SYRINGE | INTRAMUSCULAR | Status: DC
  Filled 2023-01-31: qty 0.5

## 2023-01-31 MED ORDER — IOHEXOL 300 MG/ML  SOLN
100.0000 mL | Freq: Once | INTRAMUSCULAR | Status: AC | PRN
  Administered 2023-01-31: 100 mL via INTRAVENOUS

## 2023-01-31 MED ORDER — UREA 15 G PO PACK
15.0000 g | PACK | Freq: Two times a day (BID) | ORAL | Status: DC
  Administered 2023-01-31 – 2023-02-05 (×11): 15 g via ORAL
  Filled 2023-01-31 (×12): qty 1

## 2023-01-31 NOTE — Plan of Care (Signed)
Pt received from ED. Alert and oriented. Pt ambulatory w/o any devices. Pt appears anxious with rapid and pressured speech. LE red with old closed dry blisters/ ulcers. Pt denies pain or any previous weeping. Skin care provided. Pt is voiding and denies any pain at this time.Critical result received from lab lactic acid 2.1 Provider Marisa Cyphers. Called to notify of result. RN waiting for new orders.

## 2023-01-31 NOTE — Progress Notes (Addendum)
Progress Note    Carl [name]  ZOX:[id number] DOB: [birth year]  DOA: 01/30/2023 PCP: Gracelyn Nurse, MD      Brief Narrative:    Medical records reviewed and are as summarized below:  Carl [name] is a 72 y.o. male with medical history significant for Asperger's syndrome, chronic hyponatremia, psychogenic polydipsia, smoker, atrial fibrillation on Eliquis, hypertension, who presented to the hospital with abdominal pain.  He initially went to [facility] and he was referred to the emergency department because of low sodium of 119.       Assessment/Plan:   Principal Problem:   Psychogenic polydipsia Active Problems:   Hyponatremia   Atrial fibrillation (HCC)   Hypertension   Type 2 diabetes mellitus without complication, without long-term current use of insulin (HCC)   Asthma   GERD (gastroesophageal reflux disease)   Abdominal pain   Raynaud's disease   Body mass index is 32.52 kg/m.   Acute on chronic hyponatremia, psychogenic polydipsia: Continue salt tablets.  Restart home urea tablets.  Continue fluid restriction up to 1.2 L a day.  Sodium is up from 119-122. Sodium was 128 on 06/20/2022.  Abdominal pain: No prior imaging found on chart review.  Obtain CT abdomen and pelvis with contrast for further evaluation.   Atrial fibrillation on Eliquis, hypertension: Continue Eliquis, flecainide, lisinopril, atenolol and carvedilol.   Other comorbidities include type II DM, GERD, Raynaud's disease, asthma (unknown severity) Asperger's syndrome    Diet Order             Diet renal with fluid restriction Fluid restriction: 1200 mL Fluid; Room service appropriate? Yes; Fluid consistency: Thin  Diet effective now                            Consultants: None  Procedures: None    Medications:    apixaban  5 mg Oral BID   atenolol  25 mg Oral BID   carvedilol  6.25 mg Oral BID WC   flecainide  100 mg Oral BID    lisinopril  10 mg Oral Daily   montelukast  10 mg Oral QHS   pantoprazole  40 mg Oral Daily   [START ON 02/01/2023] pneumococcal 20-valent conjugate vaccine  0.5 mL Intramuscular Tomorrow-1000   sodium chloride flush  3 mL Intravenous Q12H   sodium chloride flush  3 mL Intravenous Q12H   sodium chloride  1 g Oral TID WC   urea  15 g Oral BID   Continuous Infusions:  sodium chloride 10 mL/hr at 01/31/23 8119     Anti-infectives (From admission, onward)    None              Family Communication/Anticipated D/C date and plan/Code Status   DVT prophylaxis:  apixaban (ELIQUIS) tablet 5 mg     Code Status: Full Code  Family Communication: None Disposition Plan: Plan to discharge home in 1 to 2 days   Status is: Observation The patient will require care spanning > 2 midnights and should be moved to inpatient because: Abdominal pain, hyponatremia       Subjective:   Interval events noted.  He complains of upper abdominal pain that is worse after meals.  He said he had been using a lot of salt on his food and had been drinking a lot of water at home.  No vomiting or diarrhea.  Objective:    Vitals:   01/30/23  1924 01/30/23 2334 01/31/23 0029 01/31/23 0759  BP: (!) 145/68 (!) 171/77 (!) 171/77 (!) 158/80  Pulse: 66 70 70 60  Resp: 18 18 18 17   Temp: 98.2 F (36.8 C) 98 F (36.7 C) 98 F (36.7 C) 98.1 F (36.7 C)  TempSrc: Oral  Oral   SpO2: 99% 98%  100%  Weight:   121.2 kg   Height:   6\' 4"  (1.93 m)    No data found.   Intake/Output Summary (Last 24 hours) at 01/31/2023 1122 Last data filed at 01/31/2023 1028 Gross per 24 hour  Intake 304.29 ml  Output 300 ml  Net 4.29 ml   Filed Weights   01/31/23 0029  Weight: 121.2 kg    Exam:  GEN: NAD SKIN: Warm and dry EYES: EOMI ENT: MMM CV: RRR PULM: CTA B ABD: soft, obese, NT, +BS CNS: AAO x 3, non focal EXT: No edema or tenderness      Data Reviewed:   I have personally reviewed  following labs and imaging studies:  Labs: Labs show the following:   Basic Metabolic Panel: Recent Labs  Lab 01/30/23 21-Feb-2144 01/31/23 0423  NA 120* 122*  K  --  4.2  CL  --  93*  CO2  --  25  GLUCOSE  --  174*  BUN  --  12  CREATININE  --  0.64  CALCIUM  --  8.6*  MG 2.4  --    GFR Estimated Creatinine Clearance: 120.5 mL/min (by C-G formula based on SCr of 0.64 mg/dL). Liver Function Tests: Recent Labs  Lab 01/31/23 0423  AST 20  ALT 24  ALKPHOS 64  BILITOT 1.0  PROT 6.6  ALBUMIN 3.8   Recent Labs  Lab 01/30/23 2144/02/21  LIPASE 43   No results for input(s): "AMMONIA" in the last 168 hours. Coagulation profile No results for input(s): "INR", "PROTIME" in the last 168 hours.  CBC: Recent Labs  Lab 01/31/23 0423  WBC 6.0  HGB 11.6*  HCT 34.2*  MCV 81.2  PLT 212   Cardiac Enzymes: Recent Labs  Lab 01/30/23 2144-02-21  CKTOTAL 160   BNP (last 3 results) No results for input(s): "PROBNP" in the last 8760 hours. CBG: Recent Labs  Lab 01/30/23 Feb 21, 2143 01/31/23 0049  GLUCAP 69* 160*   D-Dimer: No results for input(s): "DDIMER" in the last 72 hours. Hgb A1c: No results for input(s): "HGBA1C" in the last 72 hours. Lipid Profile: No results for input(s): "CHOL", "HDL", "LDLCALC", "TRIG", "CHOLHDL", "LDLDIRECT" in the last 72 hours. Thyroid function studies: Recent Labs    01/30/23 2144/02/21  TSH 2.367   Anemia work up: No results for input(s): "VITAMINB12", "FOLATE", "FERRITIN", "TIBC", "IRON", "RETICCTPCT" in the last 72 hours. Sepsis Labs: Recent Labs  Lab 01/30/23 2355 01/31/23 0423  WBC  --  6.0  LATICACIDVEN 2.1*  --     Microbiology No results found for this or any previous visit (from the past 240 hour(s)).  Procedures and diagnostic studies:  No results found.             LOS: 0 days   Jasmon Mattice  [facility]   Pager on [email or web address]. If 7PM-7AM, please contact night-coverage at [email or web address]     01/31/2023, 11:22  AM

## 2023-01-31 NOTE — Plan of Care (Signed)

## 2023-02-01 DIAGNOSIS — I878 Other specified disorders of veins: Secondary | ICD-10-CM | POA: Diagnosis present

## 2023-02-01 DIAGNOSIS — D509 Iron deficiency anemia, unspecified: Secondary | ICD-10-CM | POA: Diagnosis present

## 2023-02-01 DIAGNOSIS — Z87891 Personal history of nicotine dependence: Secondary | ICD-10-CM | POA: Diagnosis not present

## 2023-02-01 DIAGNOSIS — R631 Polydipsia: Secondary | ICD-10-CM | POA: Diagnosis present

## 2023-02-01 DIAGNOSIS — I700 Atherosclerosis of aorta: Secondary | ICD-10-CM | POA: Diagnosis present

## 2023-02-01 DIAGNOSIS — E871 Hypo-osmolality and hyponatremia: Secondary | ICD-10-CM

## 2023-02-01 DIAGNOSIS — I7300 Raynaud's syndrome without gangrene: Secondary | ICD-10-CM | POA: Diagnosis present

## 2023-02-01 DIAGNOSIS — K30 Functional dyspepsia: Secondary | ICD-10-CM | POA: Diagnosis present

## 2023-02-01 DIAGNOSIS — E119 Type 2 diabetes mellitus without complications: Secondary | ICD-10-CM | POA: Diagnosis not present

## 2023-02-01 DIAGNOSIS — I10 Essential (primary) hypertension: Secondary | ICD-10-CM | POA: Diagnosis present

## 2023-02-01 DIAGNOSIS — Z79899 Other long term (current) drug therapy: Secondary | ICD-10-CM | POA: Diagnosis not present

## 2023-02-01 DIAGNOSIS — I2510 Atherosclerotic heart disease of native coronary artery without angina pectoris: Secondary | ICD-10-CM | POA: Diagnosis present

## 2023-02-01 DIAGNOSIS — K219 Gastro-esophageal reflux disease without esophagitis: Secondary | ICD-10-CM | POA: Diagnosis present

## 2023-02-01 DIAGNOSIS — J45909 Unspecified asthma, uncomplicated: Secondary | ICD-10-CM | POA: Diagnosis present

## 2023-02-01 DIAGNOSIS — E11649 Type 2 diabetes mellitus with hypoglycemia without coma: Secondary | ICD-10-CM | POA: Diagnosis present

## 2023-02-01 DIAGNOSIS — Z7901 Long term (current) use of anticoagulants: Secondary | ICD-10-CM | POA: Diagnosis not present

## 2023-02-01 DIAGNOSIS — F845 Asperger's syndrome: Secondary | ICD-10-CM | POA: Diagnosis present

## 2023-02-01 DIAGNOSIS — K5900 Constipation, unspecified: Secondary | ICD-10-CM | POA: Diagnosis present

## 2023-02-01 DIAGNOSIS — F54 Psychological and behavioral factors associated with disorders or diseases classified elsewhere: Secondary | ICD-10-CM

## 2023-02-01 DIAGNOSIS — I4891 Unspecified atrial fibrillation: Secondary | ICD-10-CM | POA: Diagnosis present

## 2023-02-01 DIAGNOSIS — K429 Umbilical hernia without obstruction or gangrene: Secondary | ICD-10-CM | POA: Diagnosis present

## 2023-02-01 DIAGNOSIS — I480 Paroxysmal atrial fibrillation: Secondary | ICD-10-CM | POA: Diagnosis not present

## 2023-02-01 DIAGNOSIS — Z8614 Personal history of Methicillin resistant Staphylococcus aureus infection: Secondary | ICD-10-CM | POA: Diagnosis not present

## 2023-02-01 LAB — TYPE AND SCREEN
ABO/RH(D): B NEG
Antibody Screen: NEGATIVE

## 2023-02-01 LAB — BASIC METABOLIC PANEL
Anion gap: 7 (ref 5–15)
BUN: 20 mg/dL (ref 8–23)
CO2: 25 mmol/L (ref 22–32)
Calcium: 8.5 mg/dL — ABNORMAL LOW (ref 8.9–10.3)
Chloride: 93 mmol/L — ABNORMAL LOW (ref 98–111)
Creatinine, Ser: 0.63 mg/dL (ref 0.61–1.24)
GFR, Estimated: >60 mL/min (ref >60)
Glucose, Bld: 108 mg/dL — ABNORMAL HIGH (ref 70–99)
Potassium: 4.5 mmol/L (ref 3.5–5.1)
Sodium: 125 mmol/L — ABNORMAL LOW (ref 135–145)

## 2023-02-01 LAB — GLUCOSE, CAPILLARY: Glucose-Capillary: 98 mg/dL (ref 70–99)

## 2023-02-01 MED ORDER — BISACODYL 5 MG PO TBEC: 5.0000 mg | DELAYED_RELEASE_TABLET | Freq: Every day | ORAL | Status: DC | PRN

## 2023-02-01 MED ORDER — COSYNTROPIN 0.25 MG IJ SOLR
0.2500 mg | Freq: Once | INTRAMUSCULAR | Status: AC
  Administered 2023-02-02: 0.25 mg via INTRAVENOUS
  Filled 2023-02-01: qty 0.25

## 2023-02-01 MED ORDER — FUROSEMIDE 40 MG PO TABS
40.0000 mg | ORAL_TABLET | Freq: Once | ORAL | Status: AC
  Administered 2023-02-01: 40 mg via ORAL
  Filled 2023-02-01: qty 1

## 2023-02-01 MED ORDER — POLYETHYLENE GLYCOL 3350 17 G PO PACK
17.0000 g | PACK | Freq: Every day | ORAL | Status: DC
  Administered 2023-02-01 – 2023-02-04 (×4): 17 g via ORAL
  Filled 2023-02-01 (×5): qty 1

## 2023-02-01 MED ORDER — FUROSEMIDE 20 MG PO TABS
20.0000 mg | ORAL_TABLET | Freq: Two times a day (BID) | ORAL | Status: DC
  Administered 2023-02-02 – 2023-02-05 (×7): 20 mg via ORAL
  Filled 2023-02-01 (×7): qty 1

## 2023-02-01 NOTE — Progress Notes (Addendum)
Progress Note    Carl [name]  [id number]:[id number] DOB: 1950-11-05  DOA: 01/30/2023 PCP: Gracelyn Nurse, MD      Brief Narrative:    Medical records reviewed and are as summarized below:  Carl [name] is a 72 y.o. male with medical history significant for Asperger's syndrome, chronic hyponatremia, psychogenic polydipsia, smoker, atrial fibrillation on Eliquis, hypertension, who presented to the hospital with abdominal pain.  He initially went to [facility] and he was referred to the emergency department because of low sodium of 119.       Assessment/Plan:   Principal Problem:   Psychogenic polydipsia Active Problems:   Hyponatremia   Atrial fibrillation (HCC)   Hypertension   Type 2 diabetes mellitus without complication, without long-term current use of insulin (HCC)   Asthma   GERD (gastroesophageal reflux disease)   Abdominal pain   Raynaud's disease   Body mass index is 32.47 kg/m.   Acute on chronic hyponatremia, psychogenic polydipsia: Continue salt and urea tablets.  Restart home Lasix.  Continue fluid restriction up to 1.2 L a day.  Sodium is up from 119-122-125.  Plan to discharge home tomorrow if sodium continues to improve. Sodium was 128 on 06/20/2022. Episode of hypoglycemia with glucose of 69 on admission. Cortisol was 11.7.  Check ACTH stimulation test tomorrow.   Abdominal pain: CT abdomen and pelvis showed moderate volume stool, circumferential thickening of the bladder,  left inguinal lymph node up to 1.6 cm, aortic atherosclerosis and coronary artery calcifications. MiraLAX and Colace have been prescribed for constipation.   Atrial fibrillation on Eliquis, hypertension: Continue Eliquis, flecainide, lisinopril, atenolol and carvedilol.   Other comorbidities include type II DM, GERD, Raynaud's disease, asthma (unknown severity) Asperger's syndrome    Diet Order             Diet renal with fluid restriction Fluid  restriction: 1200 mL Fluid; Room service appropriate? Yes; Fluid consistency: Thin  Diet effective now                            Consultants: None  Procedures: None    Medications:    apixaban  5 mg Oral BID   atenolol  25 mg Oral BID   carvedilol  6.25 mg Oral BID WC   flecainide  100 mg Oral BID   lisinopril  10 mg Oral Daily   montelukast  10 mg Oral QHS   pantoprazole  40 mg Oral Daily   pneumococcal 20-valent conjugate vaccine  0.5 mL Intramuscular Tomorrow-1000   polyethylene glycol  17 g Oral Daily   sodium chloride flush  3 mL Intravenous Q12H   sodium chloride flush  3 mL Intravenous Q12H   sodium chloride  1 g Oral TID WC   urea  15 g Oral BID   Continuous Infusions:  sodium chloride 10 mL/hr at 01/31/23 4403     Anti-infectives (From admission, onward)    None              Family Communication/Anticipated D/C date and plan/Code Status   DVT prophylaxis:  apixaban (ELIQUIS) tablet 5 mg     Code Status: Full Code  Family Communication: None Disposition Plan: Plan to discharge home tomorrow   Status is: Observation The patient will require care spanning > 2 midnights and should be moved to inpatient because: Abdominal pain, hyponatremia       Subjective:   Interval events  noted.  No nausea, vomiting, dizziness.  Abdominal pain is better.  He last had a bowel movement the morning of 01/30/2023 (prior to coming to the ED)  Objective:    Vitals:   01/31/23 2246 02/01/23 0023 02/01/23 0320 02/01/23 0820  BP: (!) 164/74 (!) 149/73  (!) 147/71  Pulse: (!) 57 (!) 51  (!) 52  Resp: 17 18  16   Temp: 98 F (36.7 C) (!) 97.5 F (36.4 C)  (!) 97.5 F (36.4 C)  TempSrc:      SpO2: 97% 100%  100%  Weight:   121 kg   Height:       No data found.   Intake/Output Summary (Last 24 hours) at 02/01/2023 1458 Last data filed at 02/01/2023 1042 Gross per 24 hour  Intake 240 ml  Output --  Net 240 ml   Filed Weights    01/31/23 0029 02/01/23 0320  Weight: 121.2 kg 121 kg    Exam:  GEN: NAD SKIN: No rash EYES: EOMI ENT: MMM CV: RRR PULM: CTA B ABD: soft, obese, NT, +BS CNS: AAO x 3, non focal EXT: No edema or tenderness       Data Reviewed:   I have personally reviewed following labs and imaging studies:  Labs: Labs show the following:   Basic Metabolic Panel: Recent Labs  Lab 01/30/23 2144-03-03 01/31/23 0423 02/01/23 0446  NA 120* 122* 125*  K  --  4.2 4.5  CL  --  93* 93*  CO2  --  25 25  GLUCOSE  --  174* 108*  BUN  --  12 20  CREATININE  --  0.64 0.63  CALCIUM  --  8.6* 8.5*  MG 2.4  --   --    GFR Estimated Creatinine Clearance: 120.4 mL/min (by C-G formula based on SCr of 0.63 mg/dL). Liver Function Tests: Recent Labs  Lab 01/31/23 0423  AST 20  ALT 24  ALKPHOS 64  BILITOT 1.0  PROT 6.6  ALBUMIN 3.8   Recent Labs  Lab 01/30/23 2144/03/03  LIPASE 43   No results for input(s): "AMMONIA" in the last 168 hours. Coagulation profile No results for input(s): "INR", "PROTIME" in the last 168 hours.  CBC: Recent Labs  Lab 01/31/23 0423  WBC 6.0  HGB 11.6*  HCT 34.2*  MCV 81.2  PLT 212   Cardiac Enzymes: Recent Labs  Lab 01/30/23 03/03/44  CKTOTAL 160   BNP (last 3 results) No results for input(s): "PROBNP" in the last 8760 hours. CBG: Recent Labs  Lab 01/30/23 2143/03/04 01/31/23 0049 02/01/23 0752  GLUCAP 69* 160* 98   D-Dimer: No results for input(s): "DDIMER" in the last 72 hours. Hgb A1c: No results for input(s): "HGBA1C" in the last 72 hours. Lipid Profile: No results for input(s): "CHOL", "HDL", "LDLCALC", "TRIG", "CHOLHDL", "LDLDIRECT" in the last 72 hours. Thyroid function studies: Recent Labs    01/30/23 03/03/2144  TSH 2.367   Anemia work up: No results for input(s): "VITAMINB12", "FOLATE", "FERRITIN", "TIBC", "IRON", "RETICCTPCT" in the last 72 hours. Sepsis Labs: Recent Labs  Lab 01/30/23 2355 01/31/23 0423  WBC  --  6.0  LATICACIDVEN 2.1*   --     Microbiology No results found for this or any previous visit (from the past 240 hour(s)).  Procedures and diagnostic studies:  CT ABDOMEN PELVIS W CONTRAST  Result Date: 01/31/2023 CLINICAL DATA:  Postprandial upper abdominal pain EXAM: CT ABDOMEN AND PELVIS WITH CONTRAST TECHNIQUE: Multidetector CT imaging of the  abdomen and pelvis was performed using the standard protocol following bolus administration of intravenous contrast. RADIATION DOSE REDUCTION: This exam was performed according to the departmental dose-optimization program which includes automated exposure control, adjustment of the mA and/or kV according to patient size and/or use of iterative reconstruction technique. CONTRAST:  OMNIPAQUE IOHEXOL 300 MG/ML  SOLN COMPARISON:  None Available. FINDINGS: Lower chest: No focal consolidation or pulmonary nodules. No pleural effusion or pneumothorax demonstrated. Left atrial enlargement. Coronary artery calcifications. Hepatobiliary: No focal hepatic lesions. No intra or extrahepatic biliary ductal dilation. Normal gallbladder. Pancreas: No focal lesions or main ductal dilation. Spleen: Normal in size without focal abnormality. Adrenals/Urinary Tract: No adrenal nodules. No suspicious renal mass, calculi or hydronephrosis. Left renal simple cysts. No specific follow-up imaging recommended. Circumferential mural thickening of the bladder. Stomach/Bowel: Normal appearance of the stomach. No evidence of bowel wall thickening, distention, or inflammatory changes. Colonic diverticulosis without acute diverticulitis. Moderate volume stool within the ascending and transverse colon. Normal appendix. Vascular/Lymphatic: Aortic atherosclerosis. Left inguinal lymph nodes measure up to 1.6 cm (2:102). Reproductive: Prostate is unremarkable. Other: No free fluid or free air. Ovoid simple fluid attenuation structure within the left retroperitoneum abutting the posterior upper pole left kidney measures  7.2 x 4.5 cm (2:32), likely benign lymphatic structure. Musculoskeletal: No acute or abnormal lytic or blastic osseous lesions. Multilevel degenerative changes of the partially imaged thoracic and lumbar spine. Small fat-containing bilateral inguinal hernias. IMPRESSION: 1. Circumferential mural thickening of the bladder, which may be seen in the setting of cystitis or related to relative underdistention. Recommend correlation with urinalysis. 2. Left inguinal lymph nodes measure up to 1.6 cm, nonspecific. 3. Moderate volume stool within the ascending and transverse colon. 4. Aortic Atherosclerosis (ICD10-I70.0). Coronary artery calcifications. Assessment for potential risk factor modification, dietary therapy or pharmacologic therapy may be warranted, if clinically indicated. Electronically Signed   By: Agustin Cree M.D.   On: 01/31/2023 16:15               LOS: 0 days   Kylina Vultaggio  [facility]   Pager on [email or web address]. If 7PM-7AM, please contact night-coverage at [email or web address]     02/01/2023, 2:58 PM

## 2023-02-02 DIAGNOSIS — E871 Hypo-osmolality and hyponatremia: Secondary | ICD-10-CM | POA: Diagnosis not present

## 2023-02-02 DIAGNOSIS — R631 Polydipsia: Secondary | ICD-10-CM | POA: Diagnosis not present

## 2023-02-02 DIAGNOSIS — F54 Psychological and behavioral factors associated with disorders or diseases classified elsewhere: Secondary | ICD-10-CM | POA: Diagnosis not present

## 2023-02-02 LAB — ACTH STIMULATION, 3 TIME POINTS
Cortisol, 30 Min: 20 ug/dL
Cortisol, 60 Min: 25.4 ug/dL
Cortisol, Base: 7.8 ug/dL

## 2023-02-02 LAB — BASIC METABOLIC PANEL
Anion gap: 8 (ref 5–15)
BUN: 21 mg/dL (ref 8–23)
CO2: 25 mmol/L (ref 22–32)
Calcium: 8.9 mg/dL (ref 8.9–10.3)
Chloride: 93 mmol/L — ABNORMAL LOW (ref 98–111)
Creatinine, Ser: 0.62 mg/dL (ref 0.61–1.24)
GFR, Estimated: >60 mL/min (ref >60)
Glucose, Bld: 114 mg/dL — ABNORMAL HIGH (ref 70–99)
Potassium: 4.4 mmol/L (ref 3.5–5.1)
Sodium: 126 mmol/L — ABNORMAL LOW (ref 135–145)

## 2023-02-02 LAB — LACTIC ACID, PLASMA: Lactic Acid, Venous: 1.1 mmol/L (ref 0.5–1.9)

## 2023-02-02 MED ORDER — RIVAROXABAN 20 MG PO TABS
20.0000 mg | ORAL_TABLET | Freq: Every day | ORAL | Status: DC
  Administered 2023-02-02 – 2023-02-04 (×3): 20 mg via ORAL
  Filled 2023-02-02 (×4): qty 1

## 2023-02-02 NOTE — Plan of Care (Signed)

## 2023-02-02 NOTE — Progress Notes (Signed)
Pt did not sleep tonight, continues to c/o thirst 1200 fluid restriction maintained. Pt c/o abd pain. Cosyntropin given after labs drawn at 0629.  Lab is aware of 30 min repeat of labs.

## 2023-02-02 NOTE — Progress Notes (Addendum)
Progress Note    Carl [name]  [id number]:[id number] DOB: September 21, 1950  DOA: 01/30/2023 PCP: Gracelyn Nurse, MD      Brief Narrative:    Medical records reviewed and are as summarized below:  Carl [name] is a 72 y.o. male with medical history significant for Asperger's syndrome, chronic hyponatremia, psychogenic polydipsia, smoker, atrial fibrillation on Eliquis, hypertension, who presented to the hospital with abdominal pain.  He initially went to [facility] and he was referred to the emergency department because of low sodium of 119.       Assessment/Plan:   Principal Problem:   Psychogenic polydipsia Active Problems:   Hyponatremia   Atrial fibrillation (HCC)   Hypertension   Type 2 diabetes mellitus without complication, without long-term current use of insulin (HCC)   Asthma   GERD (gastroesophageal reflux disease)   Abdominal pain   Raynaud's disease   Body mass index is 32.2 kg/m.   Acute on chronic hyponatremia, psychogenic polydipsia: Continue fluid restriction up to 1.2 L a day.  Sodium is up from 119-122-125-126.  Continue salt tablets, urea tablets and Lasix.  Continue fluid restriction up to 1.2 L a day.  Patient says he prefers sodium to be at least 128 before going home.  He is concerned about coming back to the hospital too soon for worsening hyponatremia. Episode of hypoglycemia with glucose of 69 on admission. Cortisol was 11.7.  ACTH stimulation test is pending.   Abdominal pain: CT abdomen and pelvis showed moderate volume stool, circumferential thickening of the bladder,  left inguinal lymph node up to 1.6 cm, aortic atherosclerosis and coronary artery calcifications. MiraLAX and Dulcolax for constipation.   Atrial fibrillation on Eliquis, hypertension: Continue flecainide, lisinopril, atenolol.  Discontinue carvedilol.  Chart review showed that Dr. Juliann Pares, cardiologist,had switched Eliquis to Xarelto (from office visit on  12/30/2022)   Other comorbidities include type II DM, GERD, Raynaud's disease, asthma (unknown severity) Asperger's syndrome    Diet Order             Diet renal with fluid restriction Fluid restriction: 1200 mL Fluid; Room service appropriate? Yes; Fluid consistency: Thin  Diet effective now                            Consultants: None  Procedures: None    Medications:    atenolol  25 mg Oral BID   flecainide  100 mg Oral BID   furosemide  20 mg Oral BID   lisinopril  10 mg Oral Daily   montelukast  10 mg Oral QHS   pantoprazole  40 mg Oral Daily   pneumococcal 20-valent conjugate vaccine  0.5 mL Intramuscular Tomorrow-1000   polyethylene glycol  17 g Oral Daily   rivaroxaban  20 mg Oral Q supper   sodium chloride flush  3 mL Intravenous Q12H   sodium chloride flush  3 mL Intravenous Q12H   sodium chloride  1 g Oral TID WC   urea  15 g Oral BID   Continuous Infusions:  sodium chloride 10 mL/hr at 01/31/23 5784     Anti-infectives (From admission, onward)    None              Family Communication/Anticipated D/C date and plan/Code Status   DVT prophylaxis:  rivaroxaban (XARELTO) tablet 20 mg     Code Status: Full Code  Family Communication: None Disposition Plan: Plan to discharge home tomorrow  Status is: Inpatient Remains inpatient appropriate because: Hyponatremia         Subjective:   Interval events noted.  He feels better.  Abdominal pain is better.  He said he has moved his bowels although there is no record of this.    Objective:    Vitals:   02/01/23 2316 02/02/23 0500 02/02/23 0800 02/02/23 0820  BP: 135/65  (!) 150/80 (!) 150/70  Pulse: (!) 52  62   Resp: 18  18   Temp: (!) 97.5 F (36.4 C)  97.8 F (36.6 C)   TempSrc:   Oral   SpO2: 100%  98%   Weight:  120 kg    Height:       No data found.   Intake/Output Summary (Last 24 hours) at 02/02/2023 1359 Last data filed at 02/02/2023 1343 Gross  per 24 hour  Intake 480 ml  Output --  Net 480 ml   Filed Weights   01/31/23 0029 02/01/23 0320 02/02/23 0500  Weight: 121.2 kg 121 kg 120 kg    Exam:  GEN: NAD SKIN: Warm and dry EYES: No pallor or icterus ENT: MMM CV: RRR PULM: CTA B ABD: soft, obese, NT, +BS CNS: AAO x 3, non focal EXT: No edema or tenderness       Data Reviewed:   I have personally reviewed following labs and imaging studies:  Labs: Labs show the following:   Basic Metabolic Panel: Recent Labs  Lab 01/30/23 02/13/2144 01/31/23 0423 01/31/23 0423 02/01/23 0446 02/02/23 0631  NA 120* 122*  --  125* 126*  K  --  4.2   < > 4.5 4.4  CL  --  93*  --  93* 93*  CO2  --  25  --  25 25  GLUCOSE  --  174*  --  108* 114*  BUN  --  12  --  20 21  CREATININE  --  0.64  --  0.63 0.62  CALCIUM  --  8.6*  --  8.5* 8.9  MG 2.4  --   --   --   --    < > = values in this interval not displayed.   GFR Estimated Creatinine Clearance: 119.9 mL/min (by C-G formula based on SCr of 0.62 mg/dL). Liver Function Tests: Recent Labs  Lab 01/31/23 0423  AST 20  ALT 24  ALKPHOS 64  BILITOT 1.0  PROT 6.6  ALBUMIN 3.8   Recent Labs  Lab 01/30/23 Feb 13, 2144  LIPASE 43   No results for input(s): "AMMONIA" in the last 168 hours. Coagulation profile No results for input(s): "INR", "PROTIME" in the last 168 hours.  CBC: Recent Labs  Lab 01/31/23 0423  WBC 6.0  HGB 11.6*  HCT 34.2*  MCV 81.2  PLT 212   Cardiac Enzymes: Recent Labs  Lab 01/30/23 02/13/44  CKTOTAL 160   BNP (last 3 results) No results for input(s): "PROBNP" in the last 8760 hours. CBG: Recent Labs  Lab 01/30/23 2143/02/13 01/31/23 0049 02/01/23 0752  GLUCAP 69* 160* 98   D-Dimer: No results for input(s): "DDIMER" in the last 72 hours. Hgb A1c: No results for input(s): "HGBA1C" in the last 72 hours. Lipid Profile: No results for input(s): "CHOL", "HDL", "LDLCALC", "TRIG", "CHOLHDL", "LDLDIRECT" in the last 72 hours. Thyroid function  studies: Recent Labs    01/30/23 Feb 13, 2144  TSH 2.367   Anemia work up: No results for input(s): "VITAMINB12", "FOLATE", "FERRITIN", "TIBC", "IRON", "RETICCTPCT" in the last 72 hours.  Sepsis Labs: Recent Labs  Lab 01/30/23 2355 01/31/23 0423 02/02/23 0631  WBC  --  6.0  --   LATICACIDVEN 2.1*  --  1.1    Microbiology No results found for this or any previous visit (from the past 240 hour(s)).  Procedures and diagnostic studies:  CT ABDOMEN PELVIS W CONTRAST  Result Date: 01/31/2023 CLINICAL DATA:  Postprandial upper abdominal pain EXAM: CT ABDOMEN AND PELVIS WITH CONTRAST TECHNIQUE: Multidetector CT imaging of the abdomen and pelvis was performed using the standard protocol following bolus administration of intravenous contrast. RADIATION DOSE REDUCTION: This exam was performed according to the departmental dose-optimization program which includes automated exposure control, adjustment of the mA and/or kV according to patient size and/or use of iterative reconstruction technique. CONTRAST:  OMNIPAQUE IOHEXOL 300 MG/ML  SOLN COMPARISON:  None Available. FINDINGS: Lower chest: No focal consolidation or pulmonary nodules. No pleural effusion or pneumothorax demonstrated. Left atrial enlargement. Coronary artery calcifications. Hepatobiliary: No focal hepatic lesions. No intra or extrahepatic biliary ductal dilation. Normal gallbladder. Pancreas: No focal lesions or main ductal dilation. Spleen: Normal in size without focal abnormality. Adrenals/Urinary Tract: No adrenal nodules. No suspicious renal mass, calculi or hydronephrosis. Left renal simple cysts. No specific follow-up imaging recommended. Circumferential mural thickening of the bladder. Stomach/Bowel: Normal appearance of the stomach. No evidence of bowel wall thickening, distention, or inflammatory changes. Colonic diverticulosis without acute diverticulitis. Moderate volume stool within the ascending and transverse colon. Normal  appendix. Vascular/Lymphatic: Aortic atherosclerosis. Left inguinal lymph nodes measure up to 1.6 cm (2:102). Reproductive: Prostate is unremarkable. Other: No free fluid or free air. Ovoid simple fluid attenuation structure within the left retroperitoneum abutting the posterior upper pole left kidney measures 7.2 x 4.5 cm (2:32), likely benign lymphatic structure. Musculoskeletal: No acute or abnormal lytic or blastic osseous lesions. Multilevel degenerative changes of the partially imaged thoracic and lumbar spine. Small fat-containing bilateral inguinal hernias. IMPRESSION: 1. Circumferential mural thickening of the bladder, which may be seen in the setting of cystitis or related to relative underdistention. Recommend correlation with urinalysis. 2. Left inguinal lymph nodes measure up to 1.6 cm, nonspecific. 3. Moderate volume stool within the ascending and transverse colon. 4. Aortic Atherosclerosis (ICD10-I70.0). Coronary artery calcifications. Assessment for potential risk factor modification, dietary therapy or pharmacologic therapy may be warranted, if clinically indicated. Electronically Signed   By: Agustin Cree M.D.   On: 01/31/2023 16:15               LOS: 1 day   Tabor Bartram  [facility]   Pager on [email or web address]. If 7PM-7AM, please contact night-coverage at [email or web address]     02/02/2023, 1:59 PM

## 2023-02-03 DIAGNOSIS — F54 Psychological and behavioral factors associated with disorders or diseases classified elsewhere: Secondary | ICD-10-CM | POA: Diagnosis not present

## 2023-02-03 DIAGNOSIS — E871 Hypo-osmolality and hyponatremia: Secondary | ICD-10-CM | POA: Diagnosis not present

## 2023-02-03 DIAGNOSIS — R631 Polydipsia: Secondary | ICD-10-CM | POA: Diagnosis not present

## 2023-02-03 LAB — SODIUM: Sodium: 127 mmol/L — ABNORMAL LOW (ref 135–145)

## 2023-02-03 MED ORDER — SENNOSIDES-DOCUSATE SODIUM 8.6-50 MG PO TABS
1.0000 | ORAL_TABLET | Freq: Every day | ORAL | Status: DC
  Administered 2023-02-03 – 2023-02-05 (×3): 1 via ORAL
  Filled 2023-02-03 (×3): qty 1

## 2023-02-03 NOTE — Progress Notes (Signed)
[facility]  ROUNDING NOTE   Subjective:   [name] was admitted to [facility] on 01/30/2023 for Polydipsia [R63.1] Hyponatremia [E87.1]  Patient was  found to have a serum sodium of 119 when he wwent to the walk in clinic for abdominal pain. Patient states he felt his symptoms were due to his medications.   He reports drinking a lot of water to help with his abdominal pain.   Denies any other GI symptoms.   Objective:  Vital signs in last 24 hours:  Temp:  [97.8 F (36.6 C)-97.9 F (36.6 C)] 97.9 F (36.6 C) (06/25 0729) Pulse Rate:  [61] 61 (06/25 0729) Resp:  [16-18] 18 (06/25 0729) BP: (130-148)/(72-73) 130/72 (06/25 0729) SpO2:  [97 %-100 %] 97 % (06/25 0729) Weight:  [121 kg] 121 kg (06/25 0500)  Weight change: 1 kg Filed Weights   02/01/23 0320 02/02/23 0500 02/03/23 0500  Weight: 121 kg 120 kg 121 kg    Intake/Output: I/O last 3 completed shifts: In: 480 [P.O.:480] Out: -    Intake/Output this shift:  No intake/output data recorded.  Physical Exam: General: NAD,   Head: Normocephalic, atraumatic. Moist oral mucosal membranes  Eyes: Anicteric, PERRL  Neck: Supple, trachea midline  Lungs:  Clear to auscultation  Heart: Regular rate and rhythm  Abdomen:  Soft, nontender,   Extremities:  no peripheral edema.  Neurologic: Nonfocal, moving all four extremities  Skin: No lesions        Basic Metabolic Panel: Recent Labs  Lab 01/30/23 2145 01/31/23 0423 02/01/23 0446 02/02/23 0631 02/03/23 0816  NA 120* 122* 125* 126* 127*  K  --  4.2 4.5 4.4  --   CL  --  93* 93* 93*  --   CO2  --  25 25 25   --   GLUCOSE  --  174* 108* 114*  --   BUN  --  12 20 21   --   CREATININE  --  0.64 0.63 0.62  --   CALCIUM  --  8.6* 8.5* 8.9  --   MG 2.4  --   --   --   --     Liver Function Tests: Recent Labs  Lab 01/31/23 0423  AST 20  ALT 24  ALKPHOS 64  BILITOT 1.0  PROT 6.6  ALBUMIN 3.8   Recent Labs  Lab 01/30/23 2145  LIPASE  43   No results for input(s): "AMMONIA" in the last 168 hours.  CBC: Recent Labs  Lab 01/31/23 0423  WBC 6.0  HGB 11.6*  HCT 34.2*  MCV 81.2  PLT 212    Cardiac Enzymes: Recent Labs  Lab 01/30/23 2145  CKTOTAL 160    BNP: Invalid input(s): "POCBNP"  CBG: Recent Labs  Lab 01/30/23 2144 01/31/23 0049 02/01/23 0752  GLUCAP 69* 160* 98    Microbiology: Results for orders placed or performed during the hospital encounter of 06/17/22  Resp Panel by RT-PCR (Flu A&B, Covid) Anterior Nasal Swab     Status: None   Collection Time: 06/17/22  5:19 PM   Specimen: Anterior Nasal Swab  Result Value Ref Range Status   SARS Coronavirus 2 by RT PCR NEGATIVE NEGATIVE Final    Comment: (NOTE) SARS-CoV-2 target nucleic acids are NOT DETECTED.  The SARS-CoV-2 RNA is generally detectable in upper respiratory specimens during the acute phase of infection. The lowest concentration of SARS-CoV-2 viral copies this assay can detect is 138 copies/mL. A negative result does not preclude  SARS-Cov-2 infection and should not be used as the sole basis for treatment or other patient management decisions. A negative result may occur with  improper specimen collection/handling, submission of specimen other than nasopharyngeal swab, presence of viral mutation(s) within the areas targeted by this assay, and inadequate number of viral copies(<138 copies/mL). A negative result must be combined with clinical observations, patient history, and epidemiological information. The expected result is Negative.  Fact Sheet for Patients:  BloggerCourse.com  Fact Sheet for Healthcare Providers:  SeriousBroker.it  This test is no t yet approved or cleared by the [place] FDA and  has been authorized for detection and/or diagnosis of SARS-CoV-2 by FDA under an Emergency Use Authorization (EUA). This EUA will remain  in effect (meaning this test can be  used) for the duration of the COVID-19 declaration under Section 564(b)(1) of the Act, 21 U.S.C.section 360bbb-3(b)(1), unless the authorization is terminated  or revoked sooner.       Influenza A by PCR NEGATIVE NEGATIVE Final   Influenza B by PCR NEGATIVE NEGATIVE Final    Comment: (NOTE) The Xpert Xpress SARS-CoV-2/FLU/RSV plus assay is intended as an aid in the diagnosis of influenza from Nasopharyngeal swab specimens and should not be used as a sole basis for treatment. Nasal washings and aspirates are unacceptable for Xpert Xpress SARS-CoV-2/FLU/RSV testing.  Fact Sheet for Patients: BloggerCourse.com  Fact Sheet for Healthcare Providers: SeriousBroker.it  This test is not yet approved or cleared by the [place] FDA and has been authorized for detection and/or diagnosis of SARS-CoV-2 by FDA under an Emergency Use Authorization (EUA). This EUA will remain in effect (meaning this test can be used) for the duration of the COVID-19 declaration under Section 564(b)(1) of the Act, 21 U.S.C. section 360bbb-3(b)(1), unless the authorization is terminated or revoked.  Performed at [facility], [address] [place], [place] [address]   Respiratory (~20 pathogens) panel by PCR     Status: None   Collection Time: 06/18/22  2:30 PM   Specimen: Nasopharyngeal Swab  Result Value Ref Range Status   Adenovirus NOT DETECTED NOT DETECTED Final   Coronavirus 229E NOT DETECTED NOT DETECTED Final    Comment: (NOTE) The Coronavirus on the Respiratory Panel, DOES NOT test for the novel  Coronavirus (2019 nCoV)    Coronavirus HKU1 NOT DETECTED NOT DETECTED Final   Coronavirus NL63 NOT DETECTED NOT DETECTED Final   Coronavirus OC43 NOT DETECTED NOT DETECTED Final   Metapneumovirus NOT DETECTED NOT DETECTED Final   Rhinovirus / Enterovirus NOT DETECTED NOT DETECTED Final   Influenza A NOT DETECTED NOT DETECTED Final    Influenza B NOT DETECTED NOT DETECTED Final   Parainfluenza Virus 1 NOT DETECTED NOT DETECTED Final   Parainfluenza Virus 2 NOT DETECTED NOT DETECTED Final   Parainfluenza Virus 3 NOT DETECTED NOT DETECTED Final   Parainfluenza Virus 4 NOT DETECTED NOT DETECTED Final   Respiratory Syncytial Virus NOT DETECTED NOT DETECTED Final   Bordetella pertussis NOT DETECTED NOT DETECTED Final   Bordetella Parapertussis NOT DETECTED NOT DETECTED Final   Chlamydophila pneumoniae NOT DETECTED NOT DETECTED Final   Mycoplasma pneumoniae NOT DETECTED NOT DETECTED Final    Comment: Performed at [facility], [address] [place], [place] [address]    Coagulation Studies: No results for input(s): "LABPROT", "INR" in the last 72 hours.  Urinalysis: No results for input(s): "COLORURINE", "LABSPEC", "PHURINE", "GLUCOSEU", "HGBUR", "BILIRUBINUR", "KETONESUR", "PROTEINUR", "UROBILINOGEN", "NITRITE", "LEUKOCYTESUR" in the last 72 hours.  Invalid input(s): "APPERANCEUR"    Imaging: No results found.   Medications:    sodium chloride 10 mL/hr at 01/31/23 1610    atenolol  25 mg Oral BID   flecainide  100 mg Oral BID   furosemide  20 mg Oral BID   lisinopril  10 mg Oral Daily   montelukast  10 mg Oral QHS   pantoprazole  40 mg Oral Daily   pneumococcal 20-valent conjugate vaccine  0.5 mL Intramuscular Tomorrow-1000   polyethylene glycol  17 g Oral Daily   rivaroxaban  20 mg Oral Q supper   senna-docusate  1 tablet Oral Daily   sodium chloride flush  3 mL Intravenous Q12H   sodium chloride flush  3 mL Intravenous Q12H   sodium chloride  1 g Oral TID WC   urea  15 g Oral BID   sodium chloride, acetaminophen **OR** acetaminophen, albuterol, bisacodyl, morphine injection, sodium chloride flush  Assessment/ Plan:  [name] is a 72 y.o.  male with atrial fibrillation, hypertension, Asperger's, psychogenic polydipsia who presents to [facility] on 01/30/2023 for Polydipsia  [R63.1] Hyponatremia [E87.1]  Hyponatremia: secondary to polydipsia.  - fluid restriction - salt tabs - Urea packets - Consider diuretics.    LOS: 2 Nolawi Kanady 6/25/20243:37 PM

## 2023-02-03 NOTE — Plan of Care (Signed)

## 2023-02-03 NOTE — Progress Notes (Addendum)
Progress Note    Carl [name]  KVQ:[id number] DOB: 1951-06-03  DOA: 01/30/2023 PCP: Gracelyn Nurse, MD      Brief Narrative:    Medical records reviewed and are as summarized below:  Carl [name] is a 72 y.o. male with medical history significant for Asperger's syndrome, chronic hyponatremia, psychogenic polydipsia, smoker, atrial fibrillation on Eliquis, hypertension, who presented to the hospital with abdominal pain.  He initially went to [facility] and he was referred to the emergency department because of low sodium of 119.       Assessment/Plan:   Principal Problem:   Psychogenic polydipsia Active Problems:   Hyponatremia   Atrial fibrillation (HCC)   Hypertension   Type 2 diabetes mellitus without complication, without long-term current use of insulin (HCC)   Asthma   GERD (gastroesophageal reflux disease)   Abdominal pain   Raynaud's disease   Body mass index is 32.47 kg/m.   Acute on chronic hyponatremia, psychogenic polydipsia: Continue fluid restriction up to 1.2 L a day.  Sodium is up from 119-122-125-126-127.  Continue salt tablets, urea tablets and Lasix.  He said he was supposed to see the nephrologist as an outpatient [facility] today.  He requested an evaluation by nephrologist while inpatient.  Consulted Dr. Wynelle Link, nephrologist.  Episode of hypoglycemia with glucose of 69 on admission. Cortisol was 11.7.  ACTH stimulation test showed normal cortisol.  Adrenal insufficiency has been ruled out.  TSH was normal.   Abdominal pain: Improved.  CT abdomen and pelvis showed moderate volume stool, circumferential thickening of the bladder,  left inguinal lymph node up to 1.6 cm, aortic atherosclerosis and coronary artery calcifications. Continue MiraLAX and add Senokot for constipation.  Use Dulcolax as needed.   Atrial fibrillation on Eliquis, hypertension: Continue flecainide, lisinopril, atenolol.  Carvedilol has been  discontinued.  Chart review showed that Dr. Juliann Pares, cardiologist,had switched Eliquis to Xarelto (from office visit on 12/30/2022).  Continue Xarelto.   Other comorbidities include type II DM, GERD, Raynaud's disease, asthma (unknown severity) Asperger's syndrome    Diet Order             Diet renal with fluid restriction Fluid restriction: 1200 mL Fluid; Room service appropriate? Yes; Fluid consistency: Thin  Diet effective now                            Consultants: None  Procedures: None    Medications:    atenolol  25 mg Oral BID   flecainide  100 mg Oral BID   furosemide  20 mg Oral BID   lisinopril  10 mg Oral Daily   montelukast  10 mg Oral QHS   pantoprazole  40 mg Oral Daily   pneumococcal 20-valent conjugate vaccine  0.5 mL Intramuscular Tomorrow-1000   polyethylene glycol  17 g Oral Daily   rivaroxaban  20 mg Oral Q supper   sodium chloride flush  3 mL Intravenous Q12H   sodium chloride flush  3 mL Intravenous Q12H   sodium chloride  1 g Oral TID WC   urea  15 g Oral BID   Continuous Infusions:  sodium chloride 10 mL/hr at 01/31/23 6433     Anti-infectives (From admission, onward)    None              Family Communication/Anticipated D/C date and plan/Code Status   DVT prophylaxis:  rivaroxaban (XARELTO) tablet 20 mg  Code Status: Full Code  Family Communication: None Disposition Plan: Plan to discharge home tomorrow   Status is: Inpatient Remains inpatient appropriate because: Hyponatremia         Subjective:   Interval events noted.  No dizziness, nausea, vomiting or abdominal pain.  He still has not moved his bowels despite taking MiraLAX.  Objective:    Vitals:   02/02/23 1524 02/03/23 0129 02/03/23 0500 02/03/23 0729  BP: 129/81 (!) 148/73  130/72  Pulse: (!) 53   61  Resp: 14 16  18   Temp: (!) 97.5 F (36.4 C) 97.8 F (36.6 C)  97.9 F (36.6 C)  TempSrc:    Oral  SpO2: 97% 100%  97%   Weight:   121 kg   Height:       No data found.   Intake/Output Summary (Last 24 hours) at 02/03/2023 1037 Last data filed at 02/02/2023 1343 Gross per 24 hour  Intake 240 ml  Output --  Net 240 ml   Filed Weights   02/01/23 0320 02/02/23 0500 02/03/23 0500  Weight: 121 kg 120 kg 121 kg    Exam:  GEN: NAD SKIN: Warm and dry EYES: No pallor or icterus ENT: MMM CV: RRR PULM: CTA B ABD: soft, obese, NT, +BS CNS: AAO x 3, non focal EXT: No edema or tenderness       Data Reviewed:   I have personally reviewed following labs and imaging studies:  Labs: Labs show the following:   Basic Metabolic Panel: Recent Labs  Lab 01/30/23 2145 01/31/23 0423 01/31/23 0423 02/01/23 0446 02/02/23 0631 02/03/23 0816  NA 120* 122*  --  125* 126* 127*  K  --  4.2   < > 4.5 4.4  --   CL  --  93*  --  93* 93*  --   CO2  --  25  --  25 25  --   GLUCOSE  --  174*  --  108* 114*  --   BUN  --  12  --  20 21  --   CREATININE  --  0.64  --  0.63 0.62  --   CALCIUM  --  8.6*  --  8.5* 8.9  --   MG 2.4  --   --   --   --   --    < > = values in this interval not displayed.   GFR Estimated Creatinine Clearance: 120.4 mL/min (by C-G formula based on SCr of 0.62 mg/dL). Liver Function Tests: Recent Labs  Lab 01/31/23 0423  AST 20  ALT 24  ALKPHOS 64  BILITOT 1.0  PROT 6.6  ALBUMIN 3.8   Recent Labs  Lab 01/30/23 2145  LIPASE 43   No results for input(s): "AMMONIA" in the last 168 hours. Coagulation profile No results for input(s): "INR", "PROTIME" in the last 168 hours.  CBC: Recent Labs  Lab 01/31/23 0423  WBC 6.0  HGB 11.6*  HCT 34.2*  MCV 81.2  PLT 212   Cardiac Enzymes: Recent Labs  Lab 01/30/23 2145  CKTOTAL 160   BNP (last 3 results) No results for input(s): "PROBNP" in the last 8760 hours. CBG: Recent Labs  Lab 01/30/23 2144 01/31/23 0049 02/01/23 0752  GLUCAP 69* 160* 98   D-Dimer: No results for input(s): "DDIMER" in the last 72  hours. Hgb A1c: No results for input(s): "HGBA1C" in the last 72 hours. Lipid Profile: No results for input(s): "CHOL", "HDL", "LDLCALC", "TRIG", "CHOLHDL", "  LDLDIRECT" in the last 72 hours. Thyroid function studies: No results for input(s): "TSH", "T4TOTAL", "T3FREE", "THYROIDAB" in the last 72 hours.  Invalid input(s): "FREET3"  Anemia work up: No results for input(s): "VITAMINB12", "FOLATE", "FERRITIN", "TIBC", "IRON", "RETICCTPCT" in the last 72 hours. Sepsis Labs: Recent Labs  Lab 01/30/23 2355 01/31/23 0423 02/02/23 0631  WBC  --  6.0  --   LATICACIDVEN 2.1*  --  1.1    Microbiology No results found for this or any previous visit (from the past 240 hour(s)).  Procedures and diagnostic studies:  No results found.             LOS: 2 days   Carl [name]  [facility]   Pager on [email or web address]. If 7PM-7AM, please contact night-coverage at [email or web address]     02/03/2023, 10:37 AM

## 2023-02-04 DIAGNOSIS — E119 Type 2 diabetes mellitus without complications: Secondary | ICD-10-CM

## 2023-02-04 DIAGNOSIS — E871 Hypo-osmolality and hyponatremia: Secondary | ICD-10-CM | POA: Diagnosis not present

## 2023-02-04 DIAGNOSIS — I480 Paroxysmal atrial fibrillation: Secondary | ICD-10-CM | POA: Diagnosis not present

## 2023-02-04 DIAGNOSIS — I10 Essential (primary) hypertension: Secondary | ICD-10-CM

## 2023-02-04 DIAGNOSIS — R631 Polydipsia: Secondary | ICD-10-CM | POA: Diagnosis not present

## 2023-02-04 LAB — SODIUM: Sodium: 130 mmol/L — ABNORMAL LOW (ref 135–145)

## 2023-02-04 MED ORDER — ALUM & MAG HYDROXIDE-SIMETH 200-200-20 MG/5ML PO SUSP: 30.0000 mL | Freq: Four times a day (QID) | ORAL | Status: DC | PRN

## 2023-02-04 NOTE — Plan of Care (Signed)

## 2023-02-04 NOTE — Progress Notes (Signed)
[facility]  ROUNDING NOTE   Subjective:   [name] was admitted to [facility] on 01/30/2023 for Polydipsia [R63.1] Hyponatremia [E87.1]  Patient siting up in bed Tolerating small meals Mild nausea  Sodium 130  Objective:  Vital signs in last 24 hours:  Temp:  [97.9 F (36.6 C)-98.7 F (37.1 C)] 97.9 F (36.6 C) (06/26 0809) Pulse Rate:  [57-59] 57 (06/26 0809) Resp:  [18-20] 19 (06/26 0809) BP: (122-144)/(62-79) 128/79 (06/26 0809) SpO2:  [96 %-100 %] 100 % (06/26 0809)  Weight change:  Filed Weights   02/01/23 0320 02/02/23 0500 02/03/23 0500  Weight: 121 kg 120 kg 121 kg    Intake/Output: No intake/output data recorded.   Intake/Output this shift:  No intake/output data recorded.  Physical Exam: General: NAD,   Head: Normocephalic, atraumatic. Moist oral mucosal membranes  Eyes: Anicteric  Neck: Supple, trachea midline  Lungs:  Clear to auscultation  Heart: Regular rate and rhythm  Abdomen:  Soft, nontender,   Extremities:  no peripheral edema.  Neurologic: Nonfocal, moving all four extremities  Skin: No lesions        Basic Metabolic Panel: Recent Labs  Lab 01/30/23 2145 01/31/23 0423 02/01/23 0446 02/02/23 0631 02/03/23 0816 02/04/23 0409  NA 120* 122* 125* 126* 127* 130*  K  --  4.2 4.5 4.4  --   --   CL  --  93* 93* 93*  --   --   CO2  --  25 25 25   --   --   GLUCOSE  --  174* 108* 114*  --   --   BUN  --  12 20 21   --   --   CREATININE  --  0.64 0.63 0.62  --   --   CALCIUM  --  8.6* 8.5* 8.9  --   --   MG 2.4  --   --   --   --   --      Liver Function Tests: Recent Labs  Lab 01/31/23 0423  AST 20  ALT 24  ALKPHOS 64  BILITOT 1.0  PROT 6.6  ALBUMIN 3.8    Recent Labs  Lab 01/30/23 2145  LIPASE 43    No results for input(s): "AMMONIA" in the last 168 hours.  CBC: Recent Labs  Lab 01/31/23 0423  WBC 6.0  HGB 11.6*  HCT 34.2*  MCV 81.2  PLT 212     Cardiac Enzymes: Recent Labs  Lab  01/30/23 2145  CKTOTAL 160     BNP: Invalid input(s): "POCBNP"  CBG: Recent Labs  Lab 01/30/23 2144 01/31/23 0049 02/01/23 0752  GLUCAP 69* 160* 98     Microbiology: Results for orders placed or performed during the hospital encounter of 06/17/22  Resp Panel by RT-PCR (Flu A&B, Covid) Anterior Nasal Swab     Status: None   Collection Time: 06/17/22  5:19 PM   Specimen: Anterior Nasal Swab  Result Value Ref Range Status   SARS Coronavirus 2 by RT PCR NEGATIVE NEGATIVE Final    Comment: (NOTE) SARS-CoV-2 target nucleic acids are NOT DETECTED.  The SARS-CoV-2 RNA is generally detectable in upper respiratory specimens during the acute phase of infection. The lowest concentration of SARS-CoV-2 viral copies this assay can detect is 138 copies/mL. A negative result does not preclude SARS-Cov-2 infection and should not be used as the sole basis for treatment or other patient management decisions. A negative result may occur with  improper specimen  collection/handling, submission of specimen other than nasopharyngeal swab, presence of viral mutation(s) within the areas targeted by this assay, and inadequate number of viral copies(<138 copies/mL). A negative result must be combined with clinical observations, patient history, and epidemiological information. The expected result is Negative.  Fact Sheet for Patients:  BloggerCourse.com  Fact Sheet for Healthcare Providers:  SeriousBroker.it  This test is no t yet approved or cleared by the [place] FDA and  has been authorized for detection and/or diagnosis of SARS-CoV-2 by FDA under an Emergency Use Authorization (EUA). This EUA will remain  in effect (meaning this test can be used) for the duration of the COVID-19 declaration under Section 564(b)(1) of the Act, 21 U.S.C.section 360bbb-3(b)(1), unless the authorization is terminated  or revoked sooner.        Influenza A by PCR NEGATIVE NEGATIVE Final   Influenza B by PCR NEGATIVE NEGATIVE Final    Comment: (NOTE) The Xpert Xpress SARS-CoV-2/FLU/RSV plus assay is intended as an aid in the diagnosis of influenza from Nasopharyngeal swab specimens and should not be used as a sole basis for treatment. Nasal washings and aspirates are unacceptable for Xpert Xpress SARS-CoV-2/FLU/RSV testing.  Fact Sheet for Patients: BloggerCourse.com  Fact Sheet for Healthcare Providers: SeriousBroker.it  This test is not yet approved or cleared by the [place] FDA and has been authorized for detection and/or diagnosis of SARS-CoV-2 by FDA under an Emergency Use Authorization (EUA). This EUA will remain in effect (meaning this test can be used) for the duration of the COVID-19 declaration under Section 564(b)(1) of the Act, 21 U.S.C. section 360bbb-3(b)(1), unless the authorization is terminated or revoked.  Performed at [facility] [place], [address] [place], [place] [address]   Respiratory (~20 pathogens) panel by PCR     Status: None   Collection Time: 06/18/22  2:30 PM   Specimen: Nasopharyngeal Swab  Result Value Ref Range Status   Adenovirus NOT DETECTED NOT DETECTED Final   Coronavirus 229E NOT DETECTED NOT DETECTED Final    Comment: (NOTE) The Coronavirus on the Respiratory Panel, DOES NOT test for the novel  Coronavirus (2019 nCoV)    Coronavirus HKU1 NOT DETECTED NOT DETECTED Final   Coronavirus NL63 NOT DETECTED NOT DETECTED Final   Coronavirus OC43 NOT DETECTED NOT DETECTED Final   Metapneumovirus NOT DETECTED NOT DETECTED Final   Rhinovirus / Enterovirus NOT DETECTED NOT DETECTED Final   Influenza A NOT DETECTED NOT DETECTED Final   Influenza B NOT DETECTED NOT DETECTED Final   Parainfluenza Virus 1 NOT DETECTED NOT DETECTED Final   Parainfluenza Virus 2 NOT DETECTED NOT DETECTED Final   Parainfluenza Virus 3 NOT DETECTED  NOT DETECTED Final   Parainfluenza Virus 4 NOT DETECTED NOT DETECTED Final   Respiratory Syncytial Virus NOT DETECTED NOT DETECTED Final   Bordetella pertussis NOT DETECTED NOT DETECTED Final   Bordetella Parapertussis NOT DETECTED NOT DETECTED Final   Chlamydophila pneumoniae NOT DETECTED NOT DETECTED Final   Mycoplasma pneumoniae NOT DETECTED NOT DETECTED Final    Comment: Performed at [facility], [address], [place], [place] [address]    Coagulation Studies: No results for input(s): "LABPROT", "INR" in the last 72 hours.  Urinalysis: No results for input(s): "COLORURINE", "LABSPEC", "PHURINE", "GLUCOSEU", "HGBUR", "BILIRUBINUR", "KETONESUR", "PROTEINUR", "UROBILINOGEN", "NITRITE", "LEUKOCYTESUR" in the last 72 hours.  Invalid input(s): "APPERANCEUR"    Imaging: No results found.   Medications:    sodium chloride 10 mL/hr at 01/31/23 [phone]  atenolol  25 mg Oral BID   flecainide  100 mg Oral BID   furosemide  20 mg Oral BID   lisinopril  10 mg Oral Daily   montelukast  10 mg Oral QHS   pantoprazole  40 mg Oral Daily   pneumococcal 20-valent conjugate vaccine  0.5 mL Intramuscular Tomorrow-1000   polyethylene glycol  17 g Oral Daily   rivaroxaban  20 mg Oral Q supper   senna-docusate  1 tablet Oral Daily   sodium chloride flush  3 mL Intravenous Q12H   sodium chloride flush  3 mL Intravenous Q12H   sodium chloride  1 g Oral TID WC   urea  15 g Oral BID   sodium chloride, acetaminophen **OR** acetaminophen, albuterol, alum & mag hydroxide-simeth, bisacodyl, morphine injection, sodium chloride flush  Assessment/ Plan:  [name] is a 72 y.o.  male with atrial fibrillation, hypertension, Asperger's, psychogenic polydipsia who presents to [facility] on 01/30/2023 for Polydipsia [R63.1] Hyponatremia [E87.1]  Hyponatremia: secondary to polydipsia.  - Sodium corrected to 130 - Re enforced importance of fluid restriction - Continue salt tabs and urea  packets - Consider diuretics at later time   LOS: 3 Carl [name] 6/26/202412:10 PM

## 2023-02-04 NOTE — Care Management Important Message (Signed)
Important Message  Patient Details  Name: Carl [name] MRN: [id number] Date of Birth: [birth year]   Medicare Important Message Given:  N/A - LOS <3 / Initial given by admissions     Olegario Messier A Hersey Maclellan 02/04/2023, 9:55 AM

## 2023-02-04 NOTE — Progress Notes (Signed)
  Progress Note   Patient: Carl [name] WJX:[id number] DOB: July 28, 1951 DOA: 01/30/2023     3 DOS: the patient was seen and examined on 02/04/2023   Brief hospital course: Carl [name] is a 72 y.o. male with medical history significant for Asperger's syndrome, chronic hyponatremia, psychogenic polydipsia, smoker, atrial fibrillation on Eliquis, hypertension, who presented to the hospital with abdominal pain.  He initially went to [facility] and he was referred to the emergency department because of low sodium of 119.   Assessment and Plan: Acute on chronic hyponatremia, psychogenic polydipsia:  Continue fluid restriction up to 1.2 L a day.  Sodium is up from 119 to 130.  Continue salt tablets, urea tablets and Lasix. Nephrology evaluation appreciated.   Episode of hypoglycemia with glucose of 69 on admission. Sugars better. Adrenal insufficiency has been ruled out.  TSH was normal. Encourage oral diet, supplements.    Abdominal pain: on and off epigastric.  CT abdomen and pelvis showed moderate volume stool, circumferential thickening of the bladder,  left inguinal lymph node up to 1.6 cm, aortic atherosclerosis and coronary artery calcifications. Continue MiraLAX and add Senokot for constipation.  Use Dulcolax as needed. Maalox PRN gas, indigestion ordered.   Atrial fibrillation on Eliquis, hypertension: Continue flecainide, lisinopril, atenolol.  Carvedilol has been discontinued.  Chart review showed that Dr. Juliann Pares, cardiologist,had switched Eliquis to Xarelto (from office visit on 12/30/2022).  Continue Xarelto.  Hypertension-Continue patient on Coreg and atenolol and lisinopril Will hold patient's amlodipine and Lasix .  Type 2 diabetes mellitus without complication, without long-term current use of insulin (HCC)  consistent carb diet/glycemic protocol.  GERD (gastroesophageal reflux disease) Continue protonix.   Asthma Stable.  Albuterol as needed and continue  Singulair.     Subjective: Patient is seen and examined today morning. He has band like pain under chest attributes to gas discomfort. Also states that medications being given contributing to his symptoms. He wishes to go home tomorrow as he does not feel good today  Physical Exam: Vitals:   02/03/23 1550 02/04/23 0333 02/04/23 0809 02/04/23 1446  BP: 122/68 (!) 144/62 128/79 (!) 101/51  Pulse: (!) 59 (!) 57 (!) 57 (!) 53  Resp: 20 18 19 18   Temp: 98.7 F (37.1 C)  97.9 F (36.6 C) 98.6 F (37 C)  TempSrc: Oral   Oral  SpO2: 98% 96% 100% 94%  Weight:      Height:       General - Elderly Caucasian male, in distress due to pain.  HEENT - PERRLA, EOMI, atraumatic head, non tender sinuses. Lung - Clear, rales, rhonchi, wheezes. Heart - S1, S2 heard, no murmurs, rubs, trace pedal edema Neuro - Alert, awake and oriented, non focal exam. Skin - Warm and dry. Data Reviewed:  Sodium trend  Family Communication: Patient   Disposition: Status is: Inpatient Remains inpatient appropriate because: low sodium, feels weak.  Planned Discharge Destination: Home    Time spent: 44 minutes  Author: Marcelino Duster, MD 02/04/2023 4:30 PM  For on call review [email or web address].

## 2023-02-05 DIAGNOSIS — I10 Essential (primary) hypertension: Secondary | ICD-10-CM | POA: Diagnosis not present

## 2023-02-05 DIAGNOSIS — I480 Paroxysmal atrial fibrillation: Secondary | ICD-10-CM | POA: Diagnosis not present

## 2023-02-05 DIAGNOSIS — E871 Hypo-osmolality and hyponatremia: Secondary | ICD-10-CM | POA: Diagnosis not present

## 2023-02-05 DIAGNOSIS — R631 Polydipsia: Secondary | ICD-10-CM | POA: Diagnosis not present

## 2023-02-05 LAB — BASIC METABOLIC PANEL
Anion gap: 9 (ref 5–15)
BUN: 35 mg/dL — ABNORMAL HIGH (ref 8–23)
CO2: 26 mmol/L (ref 22–32)
Calcium: 8.6 mg/dL — ABNORMAL LOW (ref 8.9–10.3)
Chloride: 96 mmol/L — ABNORMAL LOW (ref 98–111)
Creatinine, Ser: 0.82 mg/dL (ref 0.61–1.24)
GFR, Estimated: >60 mL/min (ref >60)
Glucose, Bld: 128 mg/dL — ABNORMAL HIGH (ref 70–99)
Potassium: 4.1 mmol/L (ref 3.5–5.1)
Sodium: 131 mmol/L — ABNORMAL LOW (ref 135–145)

## 2023-02-05 MED ORDER — SODIUM CHLORIDE 1 G PO TABS: 1.0000 g | ORAL_TABLET | Freq: Three times a day (TID) | ORAL | 0 refills | Status: AC

## 2023-02-05 MED ORDER — POLYETHYLENE GLYCOL 3350 17 G PO PACK: 17.0000 g | PACK | Freq: Every day | ORAL | 0 refills | Status: AC

## 2023-02-05 MED ORDER — SENNOSIDES-DOCUSATE SODIUM 8.6-50 MG PO TABS: 1.0000 | ORAL_TABLET | Freq: Every day | ORAL | 0 refills | Status: AC

## 2023-02-05 MED ORDER — ALUM & MAG HYDROXIDE-SIMETH 200-200-20 MG/5ML PO SUSP: 30.0000 mL | Freq: Four times a day (QID) | ORAL | 0 refills | Status: AC | PRN

## 2023-02-05 MED ORDER — BISACODYL 5 MG PO TBEC: 5.0000 mg | DELAYED_RELEASE_TABLET | Freq: Every day | ORAL | 0 refills | Status: AC | PRN

## 2023-02-05 NOTE — Discharge Summary (Addendum)
Physician Discharge Summary   Patient: Carl [name] MRN: [id number] DOB: 12-28-1950  Admit date:     01/30/2023  Discharge date: 02/05/23  Discharge Physician: Marcelino Duster   PCP: Gracelyn Nurse, MD   Recommendations at discharge:    Follow sodium levels, he is on fluid restriction, salt, tabs, urea packets, diuretic. Nephrology follow up.  Discharge Diagnoses: Principal Problem:   Psychogenic polydipsia Active Problems:   Hyponatremia   Atrial fibrillation (HCC)   Hypertension   Type 2 diabetes mellitus without complication, without long-term current use of insulin (HCC)   Asthma   GERD (gastroesophageal reflux disease)   Abdominal pain   Raynaud's disease  Resolved Problems:   * No resolved hospital problems. [facility] Course: Carl [name] is a 72 y.o. male with medical history significant for Asperger's syndrome, chronic hyponatremia, psychogenic polydipsia, smoker, atrial fibrillation on Eliquis, hypertension, who presented to the hospital with abdominal pain. He initially went to [facility] and he was referred to the emergency department because of low sodium of 119.  Patient is admitted to the hospitalist service for acute on chronic hyponatremia due to psychogenic polydipsia placed on fluid restriction of 1.2 L a day, started on salt tablets and urea tablets and Lasix per nephrology recommendations.  Sodium improved from 119-1 30.  Patient did mention he has abdominal discomfort on and off epigastric, started on constipation regimen, Maalox as needed for gas.  Patient is continued on his A-fib medications including flecainide lisinopril, atenolol and continued on Xarelto.  Today patient's sodium is 131, is hemodynamically stable to be discharged home. I advised him to follow-up with primary care physician, nephrology upon discharge as instructed.  He understands and agrees with the discharge plan.  Assessment and Plan: Acute on chronic  hyponatremia, psychogenic polydipsia:  advised fluid restriction up to 1.2 L a day.  Sodium is up from 119 to 131 today.  Continue salt tablets, urea tablets and Lasix. Nephrology follow up outpatient.   Episode of hypoglycemia with glucose of 69 on admission. Sugars better. Adrenal insufficiency has been ruled out.  TSH was normal. Encouraged oral diet, supplements.    Abdominal pain: on and off epigastric.  CT abdomen and pelvis showed moderate volume stool, circumferential thickening of the bladder,  left inguinal lymph node up to 1.6 cm, aortic atherosclerosis and coronary artery calcifications. Continue MiraLAX and add Senokot for constipation. Dulcolax as needed. Maalox PRN gas, indigestion helped.   Atrial fibrillation on Eliquis, hypertension: Continue flecainide, lisinopril, atenolol.  Carvedilol has been discontinued.  Chart review showed that Dr. Juliann Pares, cardiologist,had switched Eliquis to Xarelto (from office visit on 12/30/2022).  Continue Xarelto.   Hypertension-Continue atenolol, norvasc and lisinopril   Type 2 diabetes mellitus without complication, without long-term current use of insulin (HCC)  consistent carb diet/glycemic protocol.   GERD (gastroesophageal reflux disease) Continue protonix.    Asthma Stable.  Albuterol as needed and continue Singulair.      Consultants: Nephrology  Procedures performed: none  Disposition: Home Diet recommendation:  Discharge Diet Orders (From admission, onward)     Start     Ordered   02/05/23 0000  Diet - low sodium heart healthy        02/05/23 1224           Cardiac diet DISCHARGE MEDICATION: Allergies as of 02/05/2023   No Known Allergies      Medication List     STOP taking these medications    carvedilol 6.25  MG tablet Commonly known as: COREG   ELIQUIS PO       TAKE these medications    albuterol 108 (90 Base) MCG/ACT inhaler Commonly known as: VENTOLIN HFA Inhale 2 puffs into the lungs every  6 (six) hours as needed for wheezing.   alum & mag hydroxide-simeth 200-200-20 MG/5ML suspension Commonly known as: MAALOX/MYLANTA Take 30 mLs by mouth every 6 (six) hours as needed for indigestion or heartburn.   amLODipine 5 MG tablet Commonly known as: NORVASC Take 5 mg by mouth daily.   ascorbic acid 1000 MG tablet Commonly known as: VITAMIN C Take 1,000 mg by mouth daily.   atenolol 25 MG tablet Commonly known as: TENORMIN Take 25 mg by mouth 2 (two) times daily.   B COMPLEX PO Take 1 tablet by mouth daily.   bisacodyl 5 MG EC tablet Commonly known as: DULCOLAX Take 1 tablet (5 mg total) by mouth daily as needed for moderate constipation.   cetirizine 10 MG tablet Commonly known as: ZYRTEC Take 10 mg by mouth daily.   flecainide 100 MG tablet Commonly known as: TAMBOCOR Take 100 mg by mouth 2 (two) times daily.   folic acid 1 MG tablet Commonly known as: FOLVITE Take 1 mg by mouth daily.   furosemide 20 MG tablet Commonly known as: Lasix Take 1 tablet (20 mg total) by mouth 2 (two) times daily.   Garlic 100 MG Tabs Take 100 mg by mouth daily.   hyoscyamine 0.375 MG 12 hr tablet Commonly known as: LEVBID Take 1 tablet by mouth in the morning and at bedtime.   lisinopril 10 MG tablet Commonly known as: ZESTRIL Take 10 mg by mouth daily.   montelukast 10 MG tablet Commonly known as: SINGULAIR Take 10 mg by mouth at bedtime.   multivitamin tablet Take 1 tablet by mouth daily.   pantoprazole 40 MG tablet Commonly known as: PROTONIX Take 40 mg by mouth daily.   polyethylene glycol 17 g packet Commonly known as: MIRALAX / GLYCOLAX Take 17 g by mouth daily. Start taking on: February 06, 2023   QC TUMERIC COMPLEX PO Take 1 tablet by mouth daily.   rivaroxaban 20 MG Tabs tablet Commonly known as: XARELTO Take 20 mg by mouth daily with supper.   SALMON OIL PO Take 1 capsule by mouth daily.   senna-docusate 8.6-50 MG tablet Commonly known as:  Senokot-S Take 1 tablet by mouth daily. Start taking on: February 06, 2023   sodium chloride 1 g tablet Take 1 tablet (1 g total) by mouth 3 (three) times daily with meals.   urea 15 g Pack oral packet Commonly known as: URE-NA Take 15 g by mouth 2 (two) times daily.   Zinc Citrate-Phytase 25-500 MG Caps Take 1 capsule by mouth daily.        Discharge Exam: Filed Weights   02/02/23 0500 02/03/23 0500 02/05/23 0500  Weight: 120 kg 121 kg 115.5 kg   General - Elderly Caucasian male, no apparent distress  HEENT - PERRLA, EOMI, atraumatic head, non tender sinuses. Lung - Clear, rales, rhonchi, wheezes. Heart - S1, S2 heard, no murmurs, rubs, trace pedal edema Neuro - Alert, awake and oriented, non focal exam. Skin - Warm and dry.  Condition at discharge: stable  The results of significant diagnostics from this hospitalization (including imaging, microbiology, ancillary and laboratory) are listed below for reference.   Imaging Studies: CT ABDOMEN PELVIS W CONTRAST  Result Date: 01/31/2023 CLINICAL DATA:  Postprandial upper abdominal  pain EXAM: CT ABDOMEN AND PELVIS WITH CONTRAST TECHNIQUE: Multidetector CT imaging of the abdomen and pelvis was performed using the standard protocol following bolus administration of intravenous contrast. RADIATION DOSE REDUCTION: This exam was performed according to the departmental dose-optimization program which includes automated exposure control, adjustment of the mA and/or kV according to patient size and/or use of iterative reconstruction technique. CONTRAST:  OMNIPAQUE IOHEXOL 300 MG/ML  SOLN COMPARISON:  None Available. FINDINGS: Lower chest: No focal consolidation or pulmonary nodules. No pleural effusion or pneumothorax demonstrated. Left atrial enlargement. Coronary artery calcifications. Hepatobiliary: No focal hepatic lesions. No intra or extrahepatic biliary ductal dilation. Normal gallbladder. Pancreas: No focal lesions or main ductal  dilation. Spleen: Normal in size without focal abnormality. Adrenals/Urinary Tract: No adrenal nodules. No suspicious renal mass, calculi or hydronephrosis. Left renal simple cysts. No specific follow-up imaging recommended. Circumferential mural thickening of the bladder. Stomach/Bowel: Normal appearance of the stomach. No evidence of bowel wall thickening, distention, or inflammatory changes. Colonic diverticulosis without acute diverticulitis. Moderate volume stool within the ascending and transverse colon. Normal appendix. Vascular/Lymphatic: Aortic atherosclerosis. Left inguinal lymph nodes measure up to 1.6 cm (2:102). Reproductive: Prostate is unremarkable. Other: No free fluid or free air. Ovoid simple fluid attenuation structure within the left retroperitoneum abutting the posterior upper pole left kidney measures 7.2 x 4.5 cm (2:32), likely benign lymphatic structure. Musculoskeletal: No acute or abnormal lytic or blastic osseous lesions. Multilevel degenerative changes of the partially imaged thoracic and lumbar spine. Small fat-containing bilateral inguinal hernias. IMPRESSION: 1. Circumferential mural thickening of the bladder, which may be seen in the setting of cystitis or related to relative underdistention. Recommend correlation with urinalysis. 2. Left inguinal lymph nodes measure up to 1.6 cm, nonspecific. 3. Moderate volume stool within the ascending and transverse colon. 4. Aortic Atherosclerosis (ICD10-I70.0). Coronary artery calcifications. Assessment for potential risk factor modification, dietary therapy or pharmacologic therapy may be warranted, if clinically indicated. Electronically Signed   By: Agustin Cree M.D.   On: 01/31/2023 16:15    Microbiology: Results for orders placed or performed during the hospital encounter of 06/17/22  Resp Panel by RT-PCR (Flu A&B, Covid) Anterior Nasal Swab     Status: None   Collection Time: 06/17/22  5:19 PM   Specimen: Anterior Nasal Swab  Result  Value Ref Range Status   SARS Coronavirus 2 by RT PCR NEGATIVE NEGATIVE Final    Comment: (NOTE) SARS-CoV-2 target nucleic acids are NOT DETECTED.  The SARS-CoV-2 RNA is generally detectable in upper respiratory specimens during the acute phase of infection. The lowest concentration of SARS-CoV-2 viral copies this assay can detect is 138 copies/mL. A negative result does not preclude SARS-Cov-2 infection and should not be used as the sole basis for treatment or other patient management decisions. A negative result may occur with  improper specimen collection/handling, submission of specimen other than nasopharyngeal swab, presence of viral mutation(s) within the areas targeted by this assay, and inadequate number of viral copies(<138 copies/mL). A negative result must be combined with clinical observations, patient history, and epidemiological information. The expected result is Negative.  Fact Sheet for Patients:  BloggerCourse.com  Fact Sheet for Healthcare Providers:  SeriousBroker.it  This test is no t yet approved or cleared by the [place] FDA and  has been authorized for detection and/or diagnosis of SARS-CoV-2 by FDA under an Emergency Use Authorization (EUA). This EUA will remain  in effect (meaning this test can be used) for the duration of the  COVID-19 declaration under Section 564(b)(1) of the Act, 21 U.S.C.section 360bbb-3(b)(1), unless the authorization is terminated  or revoked sooner.       Influenza A by PCR NEGATIVE NEGATIVE Final   Influenza B by PCR NEGATIVE NEGATIVE Final    Comment: (NOTE) The Xpert Xpress SARS-CoV-2/FLU/RSV plus assay is intended as an aid in the diagnosis of influenza from Nasopharyngeal swab specimens and should not be used as a sole basis for treatment. Nasal washings and aspirates are unacceptable for Xpert Xpress SARS-CoV-2/FLU/RSV testing.  Fact Sheet for  Patients: BloggerCourse.com  Fact Sheet for Healthcare Providers: SeriousBroker.it  This test is not yet approved or cleared by the [place] FDA and has been authorized for detection and/or diagnosis of SARS-CoV-2 by FDA under an Emergency Use Authorization (EUA). This EUA will remain in effect (meaning this test can be used) for the duration of the COVID-19 declaration under Section 564(b)(1) of the Act, 21 U.S.C. section 360bbb-3(b)(1), unless the authorization is terminated or revoked.  Performed at [facility], [address] [place], [place] [address]   Respiratory (~20 pathogens) panel by PCR     Status: None   Collection Time: 06/18/22  2:30 PM   Specimen: Nasopharyngeal Swab  Result Value Ref Range Status   Adenovirus NOT DETECTED NOT DETECTED Final   Coronavirus 229E NOT DETECTED NOT DETECTED Final    Comment: (NOTE) The Coronavirus on the Respiratory Panel, DOES NOT test for the novel  Coronavirus (2019 nCoV)    Coronavirus HKU1 NOT DETECTED NOT DETECTED Final   Coronavirus NL63 NOT DETECTED NOT DETECTED Final   Coronavirus OC43 NOT DETECTED NOT DETECTED Final   Metapneumovirus NOT DETECTED NOT DETECTED Final   Rhinovirus / Enterovirus NOT DETECTED NOT DETECTED Final   Influenza A NOT DETECTED NOT DETECTED Final   Influenza B NOT DETECTED NOT DETECTED Final   Parainfluenza Virus 1 NOT DETECTED NOT DETECTED Final   Parainfluenza Virus 2 NOT DETECTED NOT DETECTED Final   Parainfluenza Virus 3 NOT DETECTED NOT DETECTED Final   Parainfluenza Virus 4 NOT DETECTED NOT DETECTED Final   Respiratory Syncytial Virus NOT DETECTED NOT DETECTED Final   Bordetella pertussis NOT DETECTED NOT DETECTED Final   Bordetella Parapertussis NOT DETECTED NOT DETECTED Final   Chlamydophila pneumoniae NOT DETECTED NOT DETECTED Final   Mycoplasma pneumoniae NOT DETECTED NOT DETECTED Final    Comment: Performed at [facility], [address], [place], [place] [address]    Labs: CBC: Recent Labs  Lab 01/31/23 0423  WBC 6.0  HGB 11.6*  HCT 34.2*  MCV 81.2  PLT 212   Basic Metabolic Panel: Recent Labs  Lab 01/30/23 2145 01/31/23 0423 02/01/23 0446 02/02/23 0631 02/03/23 0816 02/04/23 0409 02/05/23 0552  NA 120* 122* 125* 126* 127* 130* 131*  K  --  4.2 4.5 4.4  --   --  4.1  CL  --  93* 93* 93*  --   --  96*  CO2  --  25 25 25   --   --  26  GLUCOSE  --  174* 108* 114*  --   --  128*  BUN  --  12 20 21   --   --  35*  CREATININE  --  0.64 0.63 0.62  --   --  0.82  CALCIUM  --  8.6* 8.5* 8.9  --   --  8.6*  MG 2.4  --   --   --   --   --   --  Liver Function Tests: Recent Labs  Lab 01/31/23 0423  AST 20  ALT 24  ALKPHOS 64  BILITOT 1.0  PROT 6.6  ALBUMIN 3.8   CBG: Recent Labs  Lab 01/30/23 2144 01/31/23 0049 02/01/23 0752  GLUCAP 69* 160* 98    Discharge time spent: greater than 30 minutes.  Signed: Marcelino Duster, MD [facility] 02/05/2023

## 2023-02-05 NOTE — Progress Notes (Signed)
[facility]  ROUNDING NOTE   Subjective:   [name] was admitted to [facility] on 01/30/2023 for Polydipsia [R63.1] Hyponatremia [E87.1]  Patient seen sitting up in bed Alert and oriented   Sodium 131  Objective:  Vital signs in last 24 hours:  Temp:  [97.8 F (36.6 C)-98.6 F (37 C)] 97.8 F (36.6 C) (06/27 0856) Pulse Rate:  [53-63] 56 (06/27 0856) Resp:  [17-20] 17 (06/27 0856) BP: (101-145)/(51-70) 145/70 (06/27 0856) SpO2:  [94 %-98 %] 94 % (06/27 0856) Weight:  [115.5 kg] 115.5 kg (06/27 0500)  Weight change:  Filed Weights   02/02/23 0500 02/03/23 0500 02/05/23 0500  Weight: 120 kg 121 kg 115.5 kg    Intake/Output: I/O last 3 completed shifts: In: 1140 [P.O.:1140] Out: -    Intake/Output this shift:  Total I/O In: 960 [P.O.:960] Out: -   Physical Exam: General: NAD  Head: Normocephalic, atraumatic. Moist oral mucosal membranes  Eyes: Anicteric  Lungs:  Clear to auscultation  Heart: Regular rate and rhythm  Abdomen:  Soft, nontender  Extremities:  no peripheral edema.  Neurologic: Nonfocal, moving all four extremities  Skin: No lesions        Basic Metabolic Panel: Recent Labs  Lab 01/30/23 2145 01/30/23 2145 01/31/23 0423 02/01/23 0446 02/02/23 0631 02/03/23 0816 02/04/23 0409 02/05/23 0552  NA 120*  --  122* 125* 126* 127* 130* 131*  K  --   --  4.2 4.5 4.4  --   --  4.1  CL  --   --  93* 93* 93*  --   --  96*  CO2  --   --  25 25 25   --   --  26  GLUCOSE  --   --  174* 108* 114*  --   --  128*  BUN  --   --  12 20 21   --   --  35*  CREATININE  --   --  0.64 0.63 0.62  --   --  0.82  CALCIUM  --    < > 8.6* 8.5* 8.9  --   --  8.6*  MG 2.4  --   --   --   --   --   --   --    < > = values in this interval not displayed.     Liver Function Tests: Recent Labs  Lab 01/31/23 0423  AST 20  ALT 24  ALKPHOS 64  BILITOT 1.0  PROT 6.6  ALBUMIN 3.8    Recent Labs  Lab 01/30/23 2145  LIPASE 43    No  results for input(s): "AMMONIA" in the last 168 hours.  CBC: Recent Labs  Lab 01/31/23 0423  WBC 6.0  HGB 11.6*  HCT 34.2*  MCV 81.2  PLT 212     Cardiac Enzymes: Recent Labs  Lab 01/30/23 2145  CKTOTAL 160     BNP: Invalid input(s): "POCBNP"  CBG: Recent Labs  Lab 01/30/23 2144 01/31/23 0049 02/01/23 0752  GLUCAP 69* 160* 98     Microbiology: Results for orders placed or performed during the hospital encounter of 06/17/22  Resp Panel by RT-PCR (Flu A&B, Covid) Anterior Nasal Swab     Status: None   Collection Time: 06/17/22  5:19 PM   Specimen: Anterior Nasal Swab  Result Value Ref Range Status   SARS Coronavirus 2 by RT PCR NEGATIVE NEGATIVE Final    Comment: (NOTE) SARS-CoV-2 target nucleic acids are  NOT DETECTED.  The SARS-CoV-2 RNA is generally detectable in upper respiratory specimens during the acute phase of infection. The lowest concentration of SARS-CoV-2 viral copies this assay can detect is 138 copies/mL. A negative result does not preclude SARS-Cov-2 infection and should not be used as the sole basis for treatment or other patient management decisions. A negative result may occur with  improper specimen collection/handling, submission of specimen other than nasopharyngeal swab, presence of viral mutation(s) within the areas targeted by this assay, and inadequate number of viral copies(<138 copies/mL). A negative result must be combined with clinical observations, patient history, and epidemiological information. The expected result is Negative.  Fact Sheet for Patients:  BloggerCourse.com  Fact Sheet for Healthcare Providers:  SeriousBroker.it  This test is no t yet approved or cleared by the [place] FDA and  has been authorized for detection and/or diagnosis of SARS-CoV-2 by FDA under an Emergency Use Authorization (EUA). This EUA will remain  in effect (meaning this test can be  used) for the duration of the COVID-19 declaration under Section 564(b)(1) of the Act, 21 U.S.C.section 360bbb-3(b)(1), unless the authorization is terminated  or revoked sooner.       Influenza A by PCR NEGATIVE NEGATIVE Final   Influenza B by PCR NEGATIVE NEGATIVE Final    Comment: (NOTE) The Xpert Xpress SARS-CoV-2/FLU/RSV plus assay is intended as an aid in the diagnosis of influenza from Nasopharyngeal swab specimens and should not be used as a sole basis for treatment. Nasal washings and aspirates are unacceptable for Xpert Xpress SARS-CoV-2/FLU/RSV testing.  Fact Sheet for Patients: BloggerCourse.com  Fact Sheet for Healthcare Providers: SeriousBroker.it  This test is not yet approved or cleared by the [place] FDA and has been authorized for detection and/or diagnosis of SARS-CoV-2 by FDA under an Emergency Use Authorization (EUA). This EUA will remain in effect (meaning this test can be used) for the duration of the COVID-19 declaration under Section 564(b)(1) of the Act, 21 U.S.C. section 360bbb-3(b)(1), unless the authorization is terminated or revoked.  Performed at [facility], [address] [place], [place] [address]   Respiratory (~20 pathogens) panel by PCR     Status: None   Collection Time: 06/18/22  2:30 PM   Specimen: Nasopharyngeal Swab  Result Value Ref Range Status   Adenovirus NOT DETECTED NOT DETECTED Final   Coronavirus 229E NOT DETECTED NOT DETECTED Final    Comment: (NOTE) The Coronavirus on the Respiratory Panel, DOES NOT test for the novel  Coronavirus (2019 nCoV)    Coronavirus HKU1 NOT DETECTED NOT DETECTED Final   Coronavirus NL63 NOT DETECTED NOT DETECTED Final   Coronavirus OC43 NOT DETECTED NOT DETECTED Final   Metapneumovirus NOT DETECTED NOT DETECTED Final   Rhinovirus / Enterovirus NOT DETECTED NOT DETECTED Final   Influenza A NOT DETECTED NOT DETECTED Final    Influenza B NOT DETECTED NOT DETECTED Final   Parainfluenza Virus 1 NOT DETECTED NOT DETECTED Final   Parainfluenza Virus 2 NOT DETECTED NOT DETECTED Final   Parainfluenza Virus 3 NOT DETECTED NOT DETECTED Final   Parainfluenza Virus 4 NOT DETECTED NOT DETECTED Final   Respiratory Syncytial Virus NOT DETECTED NOT DETECTED Final   Bordetella pertussis NOT DETECTED NOT DETECTED Final   Bordetella Parapertussis NOT DETECTED NOT DETECTED Final   Chlamydophila pneumoniae NOT DETECTED NOT DETECTED Final   Mycoplasma pneumoniae NOT DETECTED NOT DETECTED Final    Comment: Performed at [facility], [address] [place], [place]  [address]    Coagulation Studies: No results for input(s): "LABPROT", "INR" in the last 72 hours.  Urinalysis: No results for input(s): "COLORURINE", "LABSPEC", "PHURINE", "GLUCOSEU", "HGBUR", "BILIRUBINUR", "KETONESUR", "PROTEINUR", "UROBILINOGEN", "NITRITE", "LEUKOCYTESUR" in the last 72 hours.  Invalid input(s): "APPERANCEUR"    Imaging: No results found.   Medications:    sodium chloride 10 mL/hr at 01/31/23 8295    atenolol  25 mg Oral BID   flecainide  100 mg Oral BID   furosemide  20 mg Oral BID   lisinopril  10 mg Oral Daily   montelukast  10 mg Oral QHS   pantoprazole  40 mg Oral Daily   pneumococcal 20-valent conjugate vaccine  0.5 mL Intramuscular Tomorrow-1000   polyethylene glycol  17 g Oral Daily   rivaroxaban  20 mg Oral Q supper   senna-docusate  1 tablet Oral Daily   sodium chloride flush  3 mL Intravenous Q12H   sodium chloride flush  3 mL Intravenous Q12H   sodium chloride  1 g Oral TID WC   urea  15 g Oral BID   sodium chloride, acetaminophen **OR** acetaminophen, albuterol, alum & mag hydroxide-simeth, bisacodyl, morphine injection, sodium chloride flush  Assessment/ Plan:  [name] is a 72 y.o.  male with atrial fibrillation, hypertension, Asperger's, psychogenic polydipsia who presents to [facility] on  01/30/2023 for Polydipsia [R63.1] Hyponatremia [E87.1]  Hyponatremia: secondary to polydipsia.  - Sodium 131 - Maintain fluid restriction - Continue salt tabs and urea packets - Consider diuretics at later time  - Will schedule outpatient follow up in our office   LOS: 4 Huxley Shurley 6/27/202412:21 PM

## 2023-04-01 ENCOUNTER — Other Ambulatory Visit: Payer: Self-pay

## 2023-04-01 ENCOUNTER — Emergency Department
Admission: EM | Admit: 2023-04-01 | Discharge: 2023-04-01 | Disposition: A | Discharge status: Home / Self Care | Payer: Medicare Other | Attending: Emergency Medicine | Admitting: Emergency Medicine

## 2023-04-01 DIAGNOSIS — E871 Hypo-osmolality and hyponatremia: Secondary | ICD-10-CM | POA: Insufficient documentation

## 2023-04-01 DIAGNOSIS — R1012 Left upper quadrant pain: Secondary | ICD-10-CM | POA: Diagnosis present

## 2023-04-01 LAB — CBC
HCT: 36.1 % — ABNORMAL LOW (ref 39.0–52.0)
Hemoglobin: 12.3 g/dL — ABNORMAL LOW (ref 13.0–17.0)
MCH: 28.2 pg (ref 26.0–34.0)
MCHC: 34.1 g/dL (ref 30.0–36.0)
MCV: 82.8 fL (ref 80.0–100.0)
Platelets: 191 10*3/uL (ref 150–400)
RBC: 4.36 MIL/uL (ref 4.22–5.81)
RDW: 14.7 % (ref 11.5–15.5)
WBC: 4.7 10*3/uL (ref 4.0–10.5)
nRBC: 0 % (ref 0.0–0.2)

## 2023-04-01 LAB — URINALYSIS, ROUTINE W REFLEX MICROSCOPIC
Bilirubin Urine: NEGATIVE
Glucose, UA: NEGATIVE mg/dL
Hgb urine dipstick: NEGATIVE
Ketones, ur: NEGATIVE mg/dL
Leukocytes,Ua: NEGATIVE
Nitrite: NEGATIVE
Protein, ur: NEGATIVE mg/dL
Specific Gravity, Urine: 1.002 — ABNORMAL LOW (ref 1.005–1.030)
pH: 7 (ref 5.0–8.0)

## 2023-04-01 LAB — COMPREHENSIVE METABOLIC PANEL
ALT: 24 U/L (ref 0–44)
AST: 24 U/L (ref 15–41)
Albumin: 3.9 g/dL (ref 3.5–5.0)
Alkaline Phosphatase: 63 U/L (ref 38–126)
Anion gap: 7 (ref 5–15)
BUN: 11 mg/dL (ref 8–23)
CO2: 23 mmol/L (ref 22–32)
Calcium: 8.5 mg/dL — ABNORMAL LOW (ref 8.9–10.3)
Chloride: 94 mmol/L — ABNORMAL LOW (ref 98–111)
Creatinine, Ser: 0.65 mg/dL (ref 0.61–1.24)
GFR, Estimated: >60 mL/min (ref >60)
Glucose, Bld: 87 mg/dL (ref 70–99)
Potassium: 4 mmol/L (ref 3.5–5.1)
Sodium: 124 mmol/L — ABNORMAL LOW (ref 135–145)
Total Bilirubin: 0.9 mg/dL (ref 0.3–1.2)
Total Protein: 7.2 g/dL (ref 6.5–8.1)

## 2023-04-01 LAB — TROPONIN I (HIGH SENSITIVITY): Troponin I (High Sensitivity): 4 ng/L (ref <18)

## 2023-04-01 LAB — LIPASE, BLOOD: Lipase: 33 U/L (ref 11–51)

## 2023-04-01 NOTE — Discharge Instructions (Signed)
Please limit your water intake. Please take your sodium pills. As we discussed please follow up with Dr. Letitia Libra so you can have repeat blood work performed.

## 2023-04-01 NOTE — ED Triage Notes (Signed)
Pt to ED via POV from home. Pt reports epigastric pain and palpitations that started this past weekend. Pt denies N/V/D or SOB. Pt with hx afib and Htn

## 2023-04-01 NOTE — ED Provider Notes (Signed)
[facility] Provider Note    Event Date/Time   First MD Initiated Contact with Patient 04/01/23 1723     (approximate)   History   Abdominal pain   HPI  Carl [name] is a 72 y.o. male who presents to the emergency department today with primary concern for upper abdominal pain.  Is located in the upper left part of his abdomen.  He states that he has been having pain here for a long time.  It does come and go.  This particular episode has been going on for the past 4 to 5 days.  He says when he has pain like this he feels bloated.  Feels like there is gas.  He does wonder if it could be due to his medication combination or his diet.  When he does feel this way it makes his atrial fibrillation act up.  Patient denies any fevers.     Physical Exam   Triage Vital Signs: ED Triage Vitals [04/01/23 1714]  Encounter Vitals Group     BP (!) 145/70     Systolic BP Percentile      Diastolic BP Percentile      Pulse Rate 61     Resp 18     Temp 98.1 F (36.7 C)     Temp Source Oral     SpO2 98 %     Weight      Height      Head Circumference      Peak Flow      Pain Score      Pain Loc      Pain Education      Exclude from Growth Chart     Most recent vital signs: Vitals:   04/01/23 1714  BP: (!) 145/70  Pulse: 61  Resp: 18  Temp: 98.1 F (36.7 C)  SpO2: 98%   General: Awake, alert, oriented. CV:  Good peripheral perfusion. Regular rate, irregular rhythm. Resp:  Normal effort. Lungs clear. Abd:  No distention. Non tender.   ED Results / Procedures / Treatments   Labs (all labs ordered are listed, but only abnormal results are displayed) Labs Reviewed  CBC - Abnormal; Notable for the following components:      Result Value   Hemoglobin 12.3 (*)    HCT 36.1 (*)    All other components within normal limits  LIPASE, BLOOD  COMPREHENSIVE METABOLIC PANEL  URINALYSIS, ROUTINE W REFLEX MICROSCOPIC  TROPONIN I (HIGH SENSITIVITY)      EKG  I, Phineas Semen, attending physician, personally viewed and interpreted this EKG  EKG Time: 1720 Rate: 63 Rhythm: atrial flutter with variable AV block Axis: left axis deviation Intervals: qtc 435 QRS: narrow ST changes: no st elevation Impression: abnormal ekg   RADIOLOGY None  PROCEDURES:  Critical Care performed: No   MEDICATIONS ORDERED IN ED: Medications - No data to display   IMPRESSION / MDM / ASSESSMENT AND PLAN / ED COURSE  I reviewed the triage vital signs and the nursing notes.                              Differential diagnosis includes, but is not limited to, pancreatitis, gastritis, ACS  Patient's presentation is most consistent with acute presentation with potential threat to life or bodily function.   The patient is on the cardiac monitor to evaluate for evidence of arrhythmia and/or significant heart rate  changes.  Patient presented to the emergency department today with primary concern for abdominal pain.  On exam patient is nontender to his abdomen.  Was noted to be in atrial flutter.  Patient has history of arrhythmia.  Blood work here without concerning troponin elevation.  At this time I would expect some elevation if this was ACS given that symptoms have been going on for a number of days.  Additionally no concerning LFT elevations or lipase elevation.  At this time I do think gastritis possible.  I discussed this with the patient.  Additionally patient's blood work was concerning for hyponatremia.  I had a discussion with the patient about this.  He states that he does at times still drink a large amount of fluids.  I tried to impart to the patient the importance of fluid restriction.  Additionally he is not regularly taking any salt pills so I did encourage him to take him for the next few days.  Additionally I encouraged patient to follow-up closely with his doctor for repeat checkup and blood work.      FINAL CLINICAL IMPRESSION(S)  / ED DIAGNOSES   Final diagnoses:  Hyponatremia      Note:  This document was prepared using Dragon voice recognition software and may include unintentional dictation errors.    Phineas Semen, MD 04/01/23 [phone]

## 2023-04-01 NOTE — ED Notes (Signed)
First Nurse Note: Patient to ED from [facility] for generalized abd pain that started during the weekend. VS WNL at [facility].

## 2023-06-02 IMAGING — US US EXTREM LOW VENOUS
1 series · 14 of 24 positions shown · non-contrast
Comparison: Bilateral lower extremity venous Doppler ultrasound
06/13/2021

CLINICAL DATA: Bilateral lower extremity pain and edema.

EXAM:
BILATERAL LOWER EXTREMITY VENOUS DOPPLER ULTRASOUND
TECHNIQUE: Gray-scale sonography with compression, as well as color and duplex
ultrasound, were performed to evaluate the deep venous system(s)
from the level of the common femoral vein through the popliteal and
proximal calf veins.

[Series 1: us venous img lower bilat (dvt) · portal-venous · 14 of 53 slices shown]
[im 1/53]
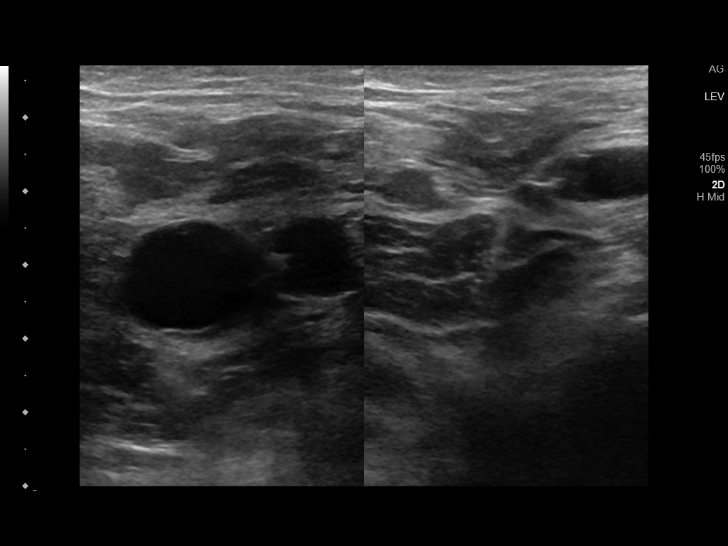
[im 5/53]
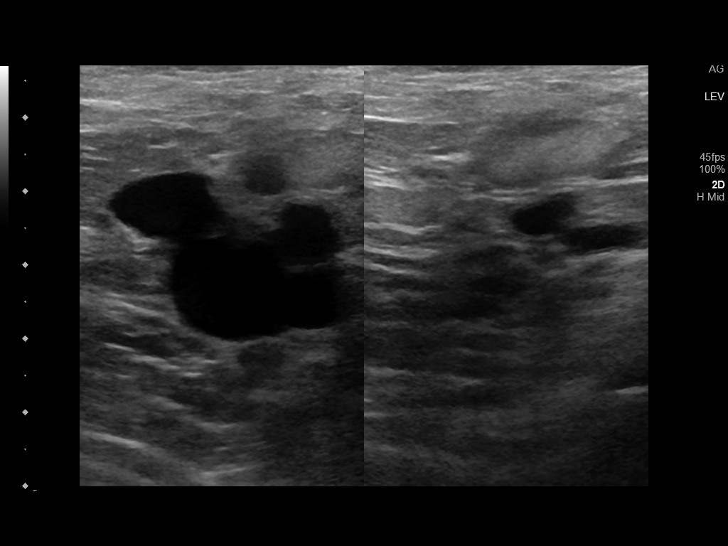
[im 10/53]
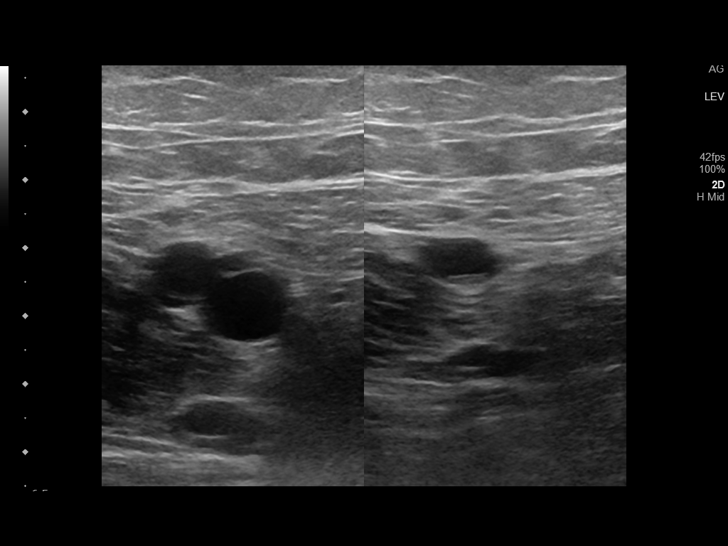
[im 14/53]
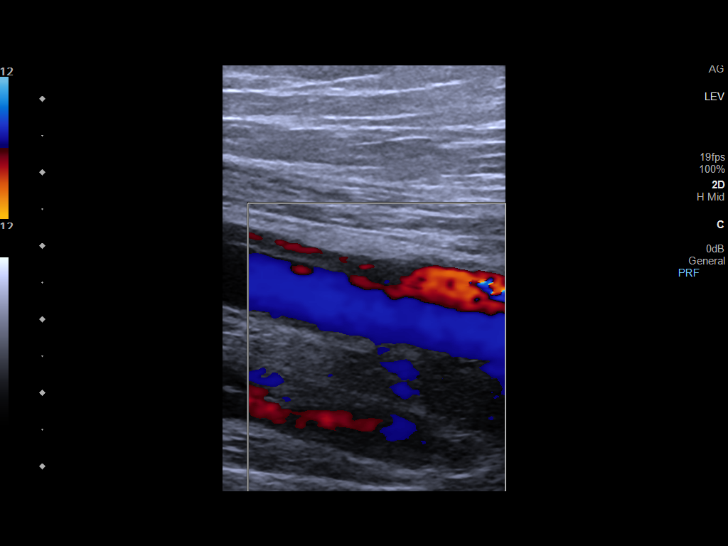
[im 16/53]
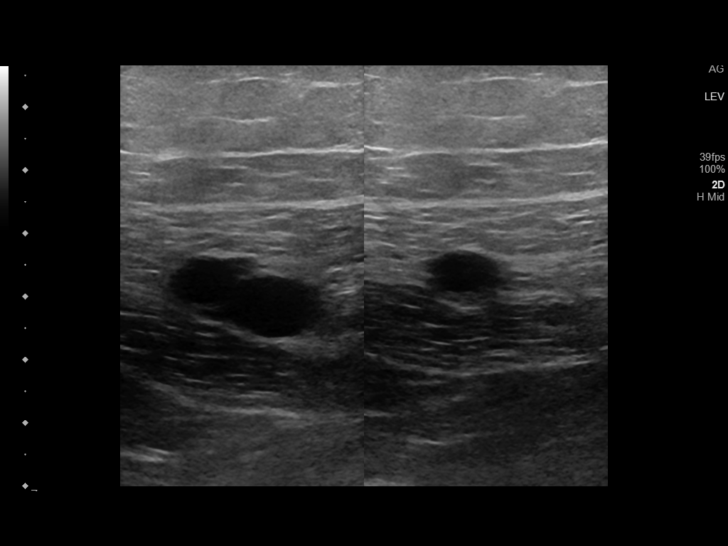
[im 21/53]
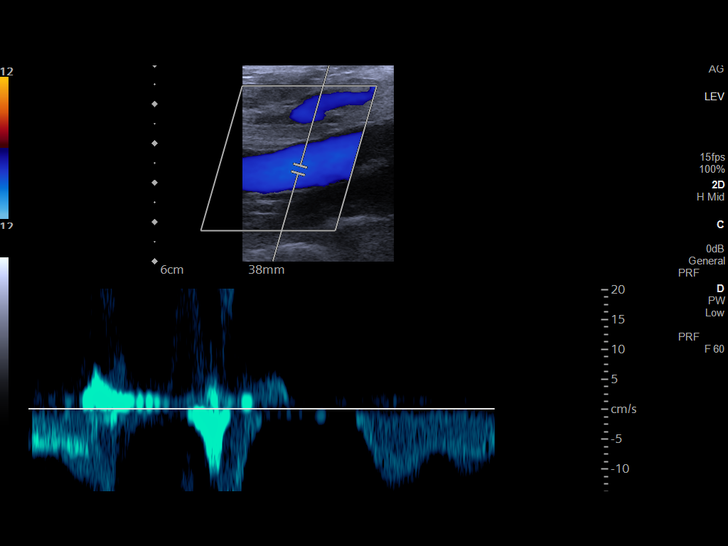
[im 25/53]
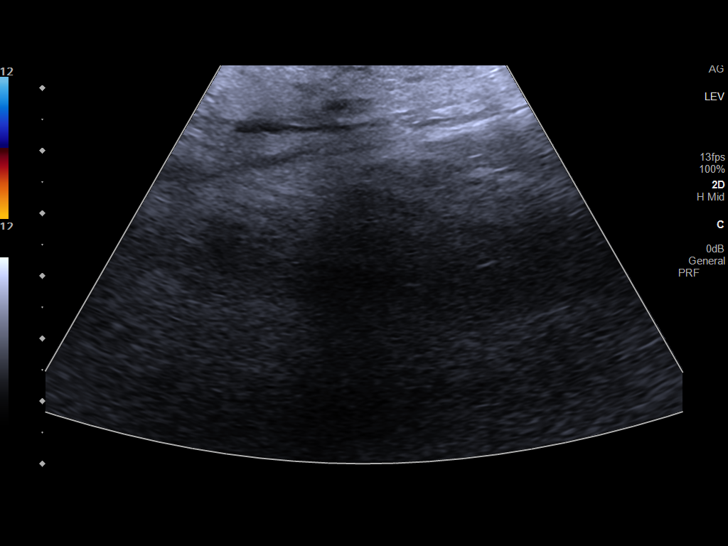
[im 28/53]
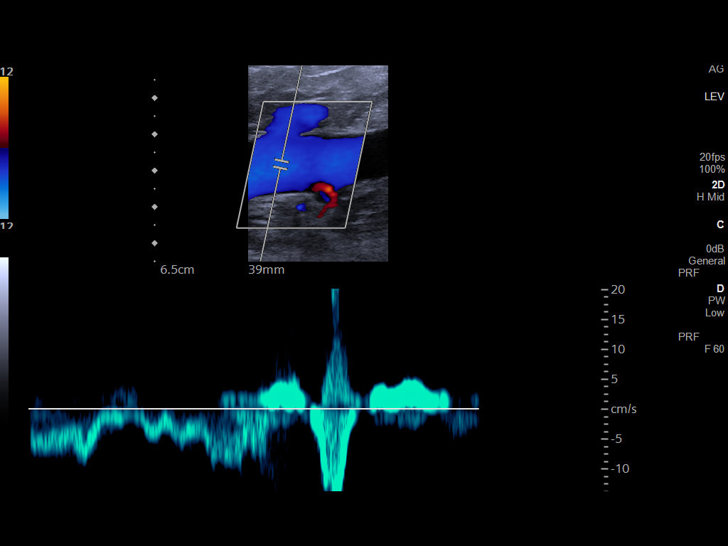
[im 32/53]
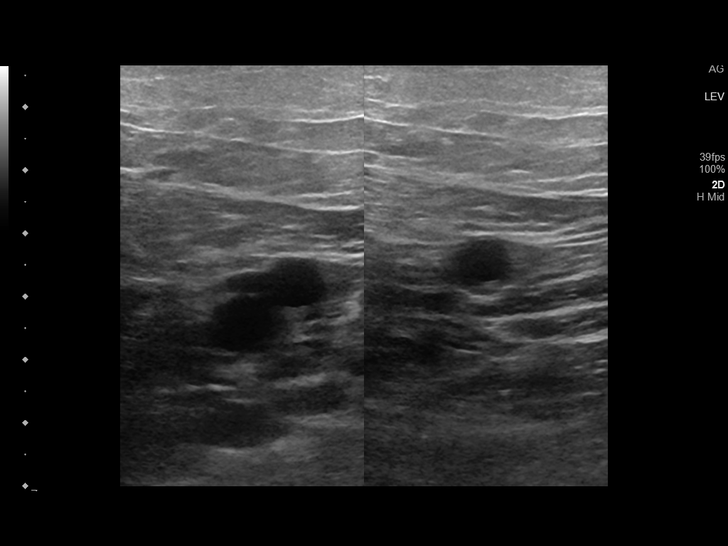
[im 37/53]
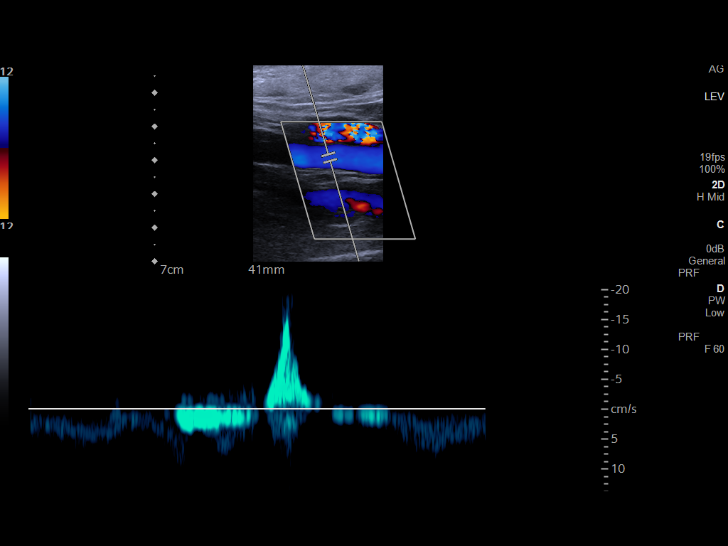
[im 41/53]
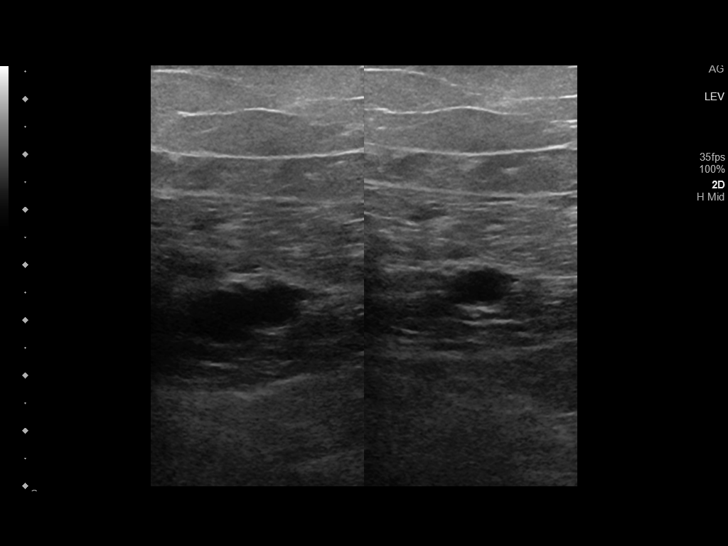
[im 43/53]
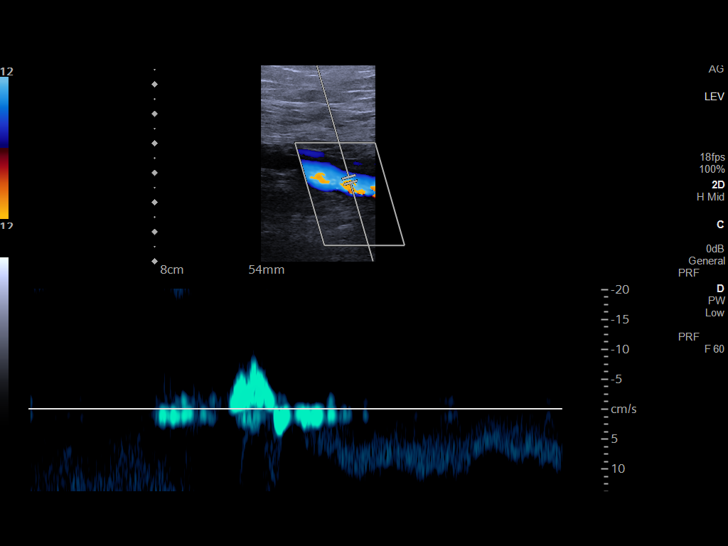
[im 48/53]
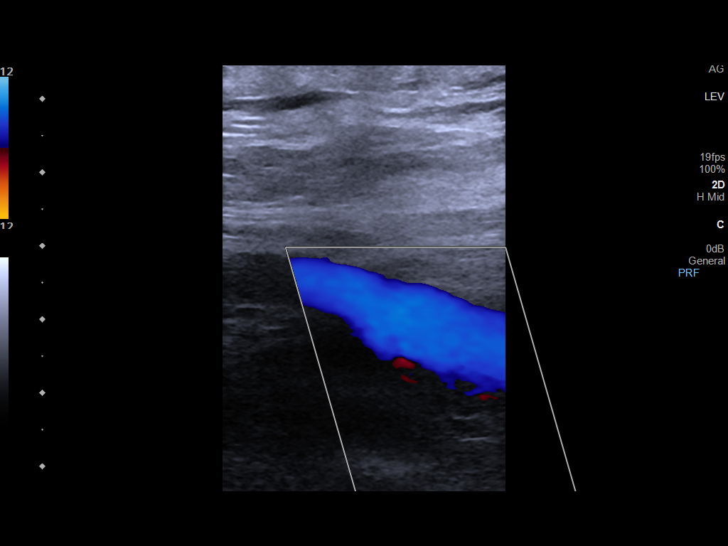
[im 53/53]
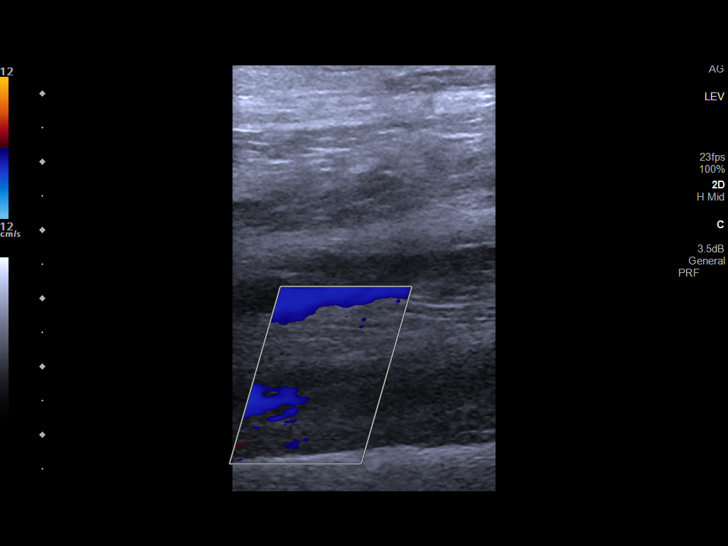

[14 of 24 positions shown; findings below may reference images not displayed]

FINDINGS: VENOUS

Normal compressibility of the common femoral, superficial femoral,
and popliteal veins bilaterally. Visualized portions of profunda
femoral vein and great saphenous vein unremarkable bilaterally. No
filling defects to suggest DVT on grayscale or color Doppler
imaging. Doppler waveforms show normal direction of venous flow,
normal respiratory plasticity and response to augmentation.

The right calf posterior tibial and peroneal veins are patent. The
left calf posterior tibial and peroneal veins were unable to be
visualized.

OTHER

None.

Limitations: none
IMPRESSION: No deep venous thrombosis is seen within either lower extremity. The
left calf veins were unable to be visualized and interrogated.

## 2023-08-13 ENCOUNTER — Other Ambulatory Visit: Payer: Self-pay

## 2023-08-13 ENCOUNTER — Inpatient Hospital Stay
Admission: EM | Admit: 2023-08-13 | Discharge: 2023-08-16 | DRG: 603 | Disposition: A | Discharge status: Home / Self Care | Payer: Medicare Other | Attending: Obstetrics and Gynecology | Admitting: Obstetrics and Gynecology

## 2023-08-13 ENCOUNTER — Emergency Department: Payer: Medicare Other

## 2023-08-13 DIAGNOSIS — E1151 Type 2 diabetes mellitus with diabetic peripheral angiopathy without gangrene: Secondary | ICD-10-CM | POA: Diagnosis present

## 2023-08-13 DIAGNOSIS — E878 Other disorders of electrolyte and fluid balance, not elsewhere classified: Secondary | ICD-10-CM | POA: Diagnosis present

## 2023-08-13 DIAGNOSIS — E871 Hypo-osmolality and hyponatremia: Secondary | ICD-10-CM | POA: Diagnosis present

## 2023-08-13 DIAGNOSIS — R631 Polydipsia: Secondary | ICD-10-CM | POA: Diagnosis present

## 2023-08-13 DIAGNOSIS — J4520 Mild intermittent asthma, uncomplicated: Secondary | ICD-10-CM

## 2023-08-13 DIAGNOSIS — W19XXXA Unspecified fall, initial encounter: Secondary | ICD-10-CM | POA: Diagnosis present

## 2023-08-13 DIAGNOSIS — Z79899 Other long term (current) drug therapy: Secondary | ICD-10-CM | POA: Diagnosis not present

## 2023-08-13 DIAGNOSIS — K219 Gastro-esophageal reflux disease without esophagitis: Secondary | ICD-10-CM | POA: Diagnosis present

## 2023-08-13 DIAGNOSIS — J45909 Unspecified asthma, uncomplicated: Secondary | ICD-10-CM | POA: Diagnosis present

## 2023-08-13 DIAGNOSIS — I482 Chronic atrial fibrillation, unspecified: Secondary | ICD-10-CM | POA: Diagnosis present

## 2023-08-13 DIAGNOSIS — D649 Anemia, unspecified: Secondary | ICD-10-CM | POA: Diagnosis present

## 2023-08-13 DIAGNOSIS — L03115 Cellulitis of right lower limb: Secondary | ICD-10-CM | POA: Diagnosis present

## 2023-08-13 DIAGNOSIS — S80211A Abrasion, right knee, initial encounter: Secondary | ICD-10-CM | POA: Diagnosis present

## 2023-08-13 DIAGNOSIS — I872 Venous insufficiency (chronic) (peripheral): Secondary | ICD-10-CM | POA: Diagnosis present

## 2023-08-13 DIAGNOSIS — Z7901 Long term (current) use of anticoagulants: Secondary | ICD-10-CM | POA: Diagnosis not present

## 2023-08-13 DIAGNOSIS — Z8614 Personal history of Methicillin resistant Staphylococcus aureus infection: Secondary | ICD-10-CM | POA: Diagnosis not present

## 2023-08-13 DIAGNOSIS — F845 Asperger's syndrome: Secondary | ICD-10-CM | POA: Diagnosis present

## 2023-08-13 DIAGNOSIS — I10 Essential (primary) hypertension: Secondary | ICD-10-CM | POA: Diagnosis present

## 2023-08-13 DIAGNOSIS — M7041 Prepatellar bursitis, right knee: Secondary | ICD-10-CM | POA: Diagnosis present

## 2023-08-13 DIAGNOSIS — L409 Psoriasis, unspecified: Secondary | ICD-10-CM | POA: Diagnosis present

## 2023-08-13 DIAGNOSIS — E119 Type 2 diabetes mellitus without complications: Secondary | ICD-10-CM

## 2023-08-13 LAB — COMPREHENSIVE METABOLIC PANEL
ALT: 22 U/L (ref 0–44)
AST: 21 U/L (ref 15–41)
Albumin: 3.9 g/dL (ref 3.5–5.0)
Alkaline Phosphatase: 63 U/L (ref 38–126)
Anion gap: 14 (ref 5–15)
BUN: 13 mg/dL (ref 8–23)
CO2: 22 mmol/L (ref 22–32)
Calcium: 8.8 mg/dL — ABNORMAL LOW (ref 8.9–10.3)
Chloride: 89 mmol/L — ABNORMAL LOW (ref 98–111)
Creatinine, Ser: 0.66 mg/dL (ref 0.61–1.24)
GFR, Estimated: >60 mL/min (ref >60)
Glucose, Bld: 91 mg/dL (ref 70–99)
Potassium: 4 mmol/L (ref 3.5–5.1)
Sodium: 125 mmol/L — ABNORMAL LOW (ref 135–145)
Total Bilirubin: 1.5 mg/dL — ABNORMAL HIGH (ref 0.0–1.2)
Total Protein: 7.2 g/dL (ref 6.5–8.1)

## 2023-08-13 LAB — CBC WITH DIFFERENTIAL/PLATELET
Abs Immature Granulocytes: 0.02 10*3/uL (ref 0.00–0.07)
Basophils Absolute: 0 10*3/uL (ref 0.0–0.1)
Basophils Relative: 1 %
Eosinophils Absolute: 0.2 10*3/uL (ref 0.0–0.5)
Eosinophils Relative: 5 %
HCT: 33.9 % — ABNORMAL LOW (ref 39.0–52.0)
Hemoglobin: 11.5 g/dL — ABNORMAL LOW (ref 13.0–17.0)
Immature Granulocytes: 0 %
Lymphocytes Relative: 13 %
Lymphs Abs: 0.6 10*3/uL — ABNORMAL LOW (ref 0.7–4.0)
MCH: 27.1 pg (ref 26.0–34.0)
MCHC: 33.9 g/dL (ref 30.0–36.0)
MCV: 80 fL (ref 80.0–100.0)
Monocytes Absolute: 0.6 10*3/uL (ref 0.1–1.0)
Monocytes Relative: 13 %
Neutro Abs: 3.1 10*3/uL (ref 1.7–7.7)
Neutrophils Relative %: 68 %
Platelets: 222 10*3/uL (ref 150–400)
RBC: 4.24 MIL/uL (ref 4.22–5.81)
RDW: 16.3 % — ABNORMAL HIGH (ref 11.5–15.5)
WBC: 4.6 10*3/uL (ref 4.0–10.5)
nRBC: 0 % (ref 0.0–0.2)

## 2023-08-13 LAB — LACTIC ACID, PLASMA: Lactic Acid, Venous: 1 mmol/L (ref 0.5–1.9)

## 2023-08-13 MED ORDER — ACETAMINOPHEN 325 MG PO TABS
650.0000 mg | ORAL_TABLET | Freq: Four times a day (QID) | ORAL | Status: DC | PRN
  Administered 2023-08-15: 650 mg via ORAL
  Filled 2023-08-13: qty 2

## 2023-08-13 MED ORDER — SODIUM CHLORIDE 0.9 % IV SOLN
2.0000 g | INTRAVENOUS | Status: DC
  Administered 2023-08-14 – 2023-08-15 (×3): 2 g via INTRAVENOUS
  Filled 2023-08-13 (×3): qty 20

## 2023-08-13 MED ORDER — RIVAROXABAN 20 MG PO TABS
20.0000 mg | ORAL_TABLET | Freq: Every day | ORAL | Status: DC
  Administered 2023-08-14 – 2023-08-15 (×2): 20 mg via ORAL
  Filled 2023-08-13 (×3): qty 1

## 2023-08-13 MED ORDER — SALMON OIL PO CAPS: ORAL_CAPSULE | Freq: Every day | ORAL | Status: DC

## 2023-08-13 MED ORDER — ACETAMINOPHEN 650 MG RE SUPP: 650.0000 mg | Freq: Four times a day (QID) | RECTAL | Status: DC | PRN

## 2023-08-13 MED ORDER — ONDANSETRON HCL 4 MG/2ML IJ SOLN: 4.0000 mg | Freq: Four times a day (QID) | INTRAMUSCULAR | Status: DC | PRN

## 2023-08-13 MED ORDER — MAGNESIUM HYDROXIDE 400 MG/5ML PO SUSP: 30.0000 mL | Freq: Every day | ORAL | Status: DC | PRN

## 2023-08-13 MED ORDER — RENA-VITE PO TABS
1.0000 | ORAL_TABLET | Freq: Every day | ORAL | Status: DC
  Administered 2023-08-14 – 2023-08-16 (×3): 1 via ORAL
  Filled 2023-08-13 (×3): qty 1

## 2023-08-13 MED ORDER — TRAZODONE HCL 50 MG PO TABS
25.0000 mg | ORAL_TABLET | Freq: Every evening | ORAL | Status: DC | PRN
  Filled 2023-08-13 (×2): qty 1

## 2023-08-13 MED ORDER — ONDANSETRON HCL 4 MG PO TABS: 4.0000 mg | ORAL_TABLET | Freq: Four times a day (QID) | ORAL | Status: DC | PRN

## 2023-08-13 MED ORDER — TURMERIC 500 MG PO CAPS: ORAL_CAPSULE | Freq: Every day | ORAL | Status: DC

## 2023-08-13 MED ORDER — BISACODYL 5 MG PO TBEC: 5.0000 mg | DELAYED_RELEASE_TABLET | Freq: Every day | ORAL | Status: DC | PRN

## 2023-08-13 MED ORDER — LISINOPRIL 10 MG PO TABS
10.0000 mg | ORAL_TABLET | Freq: Every day | ORAL | Status: DC
  Administered 2023-08-14 – 2023-08-16 (×3): 10 mg via ORAL
  Filled 2023-08-13: qty 2
  Filled 2023-08-13 (×2): qty 1

## 2023-08-13 MED ORDER — PANTOPRAZOLE SODIUM 40 MG PO TBEC
40.0000 mg | DELAYED_RELEASE_TABLET | Freq: Every day | ORAL | Status: DC
  Administered 2023-08-14 – 2023-08-16 (×3): 40 mg via ORAL
  Filled 2023-08-13 (×3): qty 1

## 2023-08-13 MED ORDER — AMLODIPINE BESYLATE 5 MG PO TABS
5.0000 mg | ORAL_TABLET | Freq: Every day | ORAL | Status: DC
  Administered 2023-08-14 – 2023-08-16 (×3): 5 mg via ORAL
  Filled 2023-08-13 (×3): qty 1

## 2023-08-13 MED ORDER — POLYETHYLENE GLYCOL 3350 17 G PO PACK
17.0000 g | PACK | Freq: Every day | ORAL | Status: DC
  Administered 2023-08-14 – 2023-08-16 (×3): 17 g via ORAL
  Filled 2023-08-13 (×3): qty 1

## 2023-08-13 MED ORDER — ZINC CITRATE-PHYTASE 25-500 MG PO CAPS: 1.0000 | ORAL_CAPSULE | Freq: Every day | ORAL | Status: DC

## 2023-08-13 MED ORDER — ATENOLOL 25 MG PO TABS
25.0000 mg | ORAL_TABLET | Freq: Two times a day (BID) | ORAL | Status: DC
  Administered 2023-08-14 – 2023-08-16 (×5): 25 mg via ORAL
  Filled 2023-08-13 (×5): qty 1

## 2023-08-13 MED ORDER — LORATADINE 10 MG PO TABS
10.0000 mg | ORAL_TABLET | Freq: Every day | ORAL | Status: DC
  Administered 2023-08-14 – 2023-08-16 (×3): 10 mg via ORAL
  Filled 2023-08-13 (×3): qty 1

## 2023-08-13 MED ORDER — ALBUTEROL SULFATE (2.5 MG/3ML) 0.083% IN NEBU: 2.5000 mg | INHALATION_SOLUTION | Freq: Four times a day (QID) | RESPIRATORY_TRACT | Status: DC | PRN

## 2023-08-13 MED ORDER — FOLIC ACID 1 MG PO TABS
1.0000 mg | ORAL_TABLET | Freq: Every day | ORAL | Status: DC
  Administered 2023-08-14 – 2023-08-16 (×3): 1 mg via ORAL
  Filled 2023-08-13 (×3): qty 1

## 2023-08-13 MED ORDER — FLECAINIDE ACETATE 100 MG PO TABS
100.0000 mg | ORAL_TABLET | Freq: Two times a day (BID) | ORAL | Status: DC
  Administered 2023-08-14 – 2023-08-16 (×5): 100 mg via ORAL
  Filled 2023-08-13 (×6): qty 1

## 2023-08-13 MED ORDER — ADULT MULTIVITAMIN W/MINERALS CH
1.0000 | ORAL_TABLET | Freq: Every day | ORAL | Status: DC
  Administered 2023-08-14 – 2023-08-16 (×3): 1 via ORAL
  Filled 2023-08-13 (×3): qty 1

## 2023-08-13 MED ORDER — SODIUM CHLORIDE 1 G PO TABS
1.0000 g | ORAL_TABLET | Freq: Three times a day (TID) | ORAL | Status: DC
  Administered 2023-08-14 – 2023-08-16 (×7): 1 g via ORAL
  Filled 2023-08-13 (×7): qty 1

## 2023-08-13 MED ORDER — GARLIC 100 MG PO TABS: 100.0000 mg | ORAL_TABLET | Freq: Every day | ORAL | Status: DC

## 2023-08-13 MED ORDER — MONTELUKAST SODIUM 10 MG PO TABS
10.0000 mg | ORAL_TABLET | Freq: Every day | ORAL | Status: DC
  Administered 2023-08-14 – 2023-08-15 (×3): 10 mg via ORAL
  Filled 2023-08-13 (×3): qty 1

## 2023-08-13 MED ORDER — ALUM & MAG HYDROXIDE-SIMETH 200-200-20 MG/5ML PO SUSP: 30.0000 mL | Freq: Four times a day (QID) | ORAL | Status: DC | PRN

## 2023-08-13 MED ORDER — SODIUM CHLORIDE 0.9 % IV SOLN: INTRAVENOUS | Status: DC

## 2023-08-13 MED ORDER — SENNOSIDES-DOCUSATE SODIUM 8.6-50 MG PO TABS
1.0000 | ORAL_TABLET | Freq: Every day | ORAL | Status: DC
  Administered 2023-08-14 – 2023-08-16 (×3): 1 via ORAL
  Filled 2023-08-13 (×3): qty 1

## 2023-08-13 MED ORDER — VITAMIN C 500 MG PO TABS
1000.0000 mg | ORAL_TABLET | Freq: Every day | ORAL | Status: DC
  Administered 2023-08-14 – 2023-08-16 (×3): 1000 mg via ORAL
  Filled 2023-08-13 (×3): qty 2

## 2023-08-13 MED ORDER — FUROSEMIDE 20 MG PO TABS
20.0000 mg | ORAL_TABLET | Freq: Two times a day (BID) | ORAL | Status: DC
  Administered 2023-08-14 – 2023-08-16 (×5): 20 mg via ORAL
  Filled 2023-08-13 (×5): qty 1

## 2023-08-13 NOTE — ED Triage Notes (Signed)
 Pt to ED via [facility]. Pt had fall on the 24th for right knee pain. Pt was given doxycycline x3 days. Pt now has swelling to upper and lower leg and pain with walking.

## 2023-08-13 NOTE — Progress Notes (Signed)
 PHARMACIST - PHYSICIAN ORDER COMMUNICATION  CONCERNING: P&T Medication Policy on Herbal Medications  DESCRIPTION:  This patient's order for:  Garlic , Salmon Oil, Turmeric   has been noted.  This product(s) is classified as an "herbal" or natural product. Due to a lack of definitive safety studies or FDA approval, nonstandard manufacturing practices, plus the potential risk of unknown drug-drug interactions while on inpatient medications, the Pharmacy and Therapeutics Committee does not permit the use of "herbal" or natural products of this type within [facility].   ACTION TAKEN: The pharmacy department is unable to verify this order at this time and your patient has been informed of this safety policy. Please reevaluate patient's clinical condition at discharge and address if the herbal or natural product(s) should be resumed at that time.

## 2023-08-13 NOTE — H&P (Addendum)
 [place]   PATIENT NAME: Carl [name]    MR#:  [id number]  DATE OF BIRTH:  04/08/1951  DATE OF ADMISSION:  08/13/2023  PRIMARY CARE PHYSICIAN: Rudolpho Norleen BIRCH, MD   Patient is coming from: Home  REQUESTING/REFERRING PHYSICIAN: Cleaster Maxwell,  PA-C  CHIEF COMPLAINT:   Chief Complaint  Patient presents with   Leg Pain    HISTORY OF PRESENT ILLNESS:  Carl [name] is a 73 y.o. Caucasian male with medical history significant for asthma, atrial fibrillation, GERD, hypertension, hyponatremia, diverticulosis and psoriasis, who presented to the emergency room with acute onset of worsening right leg swelling with erythema, warmth, and tenderness.  He had a fall on 12/24.  He was seen in urgent care on 12/30 who thought that infrapatellar bursitis for which she was given doxycycline.  Today due to persistent symptoms he was seen again in urgent care and referred to the ER for further management of suspected cellulitis.  He denied any chest pain or palpitations.  No cough or wheezing or hemoptysis.  No fever or chills.  No dysuria, oliguria or hematuria or flank pain.  ED Course: When he came into the ER, vital signs were within normal.  Labs revealed hyponatremia and hypochloremia, calcium of 8.8, total bili was 1.5 and otherwise unremarkable CMP.  Lactic acid was 1 and later 1.7.  CBC showed anemia with hemoglobin 11.5 and hematocrit 33.9 close to baseline.  Blood culture was sent.  EKG as reviewed by me : None. Imaging:  Right lower extremity venous Doppler came back negative for DVT.  The patient was placed on IV Rocephin  for cellulitis.  He will be admitted to a medical bed for further evaluation and management. PAST MEDICAL HISTORY:   Past Medical History:  Diagnosis Date   Anemia    Asperger's syndrome    Asthma    Atrial fibrillation (HCC)    Diverticulosis    GERD (gastroesophageal reflux disease)    History of MRSA infection    Hypertension     Hyponatremia    Psoriasis    Valvular heart disease     PAST SURGICAL HISTORY:   Past Surgical History:  Procedure Laterality Date   CARDIOVERSION N/A 02/27/2022   Procedure: CARDIOVERSION;  Surgeon: Hester Wolm PARAS, MD;  Location: [facility] ORS;  Service: Cardiovascular;  Laterality: N/A;   COLONOSCOPY     COLONOSCOPY WITH PROPOFOL  N/A 04/23/2018   Procedure: COLONOSCOPY WITH PROPOFOL ;  Surgeon: Gaylyn Gladis PENNER, MD;  Location: [facility];  Service: Endoscopy;  Laterality: N/A;    SOCIAL HISTORY:   Social History   Tobacco Use   Smoking status: Never   Smokeless tobacco: Never  Substance Use Topics   Alcohol use: Not Currently    Comment: rarely    FAMILY HISTORY:   Positive for breast cancer and bone marrow cancer in his mother.  DRUG ALLERGIES:  No Known Allergies  REVIEW OF SYSTEMS:   ROS As per history of present illness. All pertinent systems were reviewed above. Constitutional, HEENT, cardiovascular, respiratory, GI, GU, musculoskeletal, neuro, psychiatric, endocrine, integumentary and hematologic systems were reviewed and are otherwise negative/unremarkable except for positive findings mentioned above in the HPI.   MEDICATIONS AT HOME:   Prior to Admission medications   Medication Sig Start Date End Date Taking? Authorizing Provider  albuterol  (VENTOLIN  HFA) 108 (90 Base) MCG/ACT inhaler Inhale 2 puffs into the lungs every 6 (six) hours as needed for wheezing. 11/18/18   [provider]  alum & mag hydroxide-simeth (MAALOX/MYLANTA) 200-200-20 MG/5ML suspension Take 30 mLs by mouth every 6 (six) hours as needed for indigestion or heartburn. 02/05/23   Darci Pore, MD  amLODipine  (NORVASC ) 5 MG tablet Take 5 mg by mouth daily.    [provider]  ascorbic acid  (VITAMIN C ) 1000 MG tablet Take 1,000 mg by mouth daily.    [provider]  atenolol  (TENORMIN ) 25 MG tablet Take 25 mg by mouth 2 (two) times daily.    [provider]  B Complex Vitamins (B COMPLEX PO) Take 1 tablet by mouth daily.    [provider]  bisacodyl  (DULCOLAX) 5 MG EC tablet Take 1 tablet (5 mg total) by mouth daily as needed for moderate constipation. 02/05/23   Darci Pore, MD  cetirizine (ZYRTEC) 10 MG tablet Take 10 mg by mouth daily.    [provider]  flecainide  (TAMBOCOR ) 100 MG tablet Take 100 mg by mouth 2 (two) times daily.    [provider]  folic acid  (FOLVITE ) 1 MG tablet Take 1 mg by mouth daily.    [provider]  furosemide  (LASIX ) 20 MG tablet Take 1 tablet (20 mg total) by mouth 2 (two) times daily. 06/20/22 06/20/23  Krishnan, Sendil K, MD  Garlic  100 MG TABS Take 100 mg by mouth daily.    [provider]  hyoscyamine  (LEVBID ) 0.375 MG 12 hr tablet Take 1 tablet by mouth in the morning and at bedtime. Patient not taking: Reported on 02/04/2023 07/26/20   [provider]  lisinopril  (PRINIVIL ,ZESTRIL ) 10 MG tablet Take 10 mg by mouth daily.    [provider]  montelukast  (SINGULAIR ) 10 MG tablet Take 10 mg by mouth at bedtime.    [provider]  Multiple Vitamin (MULTIVITAMIN) tablet Take 1 tablet by mouth daily.    [provider]  Omega-3 Fatty Acids (SALMON OIL PO) Take 1 capsule by mouth daily.    [provider]  pantoprazole  (PROTONIX ) 40 MG tablet Take 40 mg by mouth daily.    [provider]  polyethylene glycol (MIRALAX  / GLYCOLAX ) 17 g packet Take 17 g by mouth daily. 02/06/23   Darci Pore, MD  rivaroxaban  (XARELTO ) 20 MG TABS tablet Take 20 mg by mouth daily with supper.    [provider]  senna-docusate (SENOKOT-S) 8.6-50 MG tablet Take 1 tablet by mouth daily. 02/06/23   Darci Pore, MD  sodium chloride  1 g tablet Take 1 tablet (1 g total) by mouth 3 (three) times daily with meals. 02/05/23   Darci Pore, MD  Turmeric (QC TUMERIC COMPLEX PO) Take 1 tablet by  mouth daily.    [provider]  urea  (URE-NA) 15 g PACK oral packet Take 15 g by mouth 2 (two) times daily. Patient not taking: Reported on 02/04/2023 06/20/22   Krishnan, Sendil K, MD  Zinc  Citrate-Phytase 25-500 MG CAPS Take 1 capsule by mouth daily.    [provider]      VITAL SIGNS:  Blood pressure (!) 148/72, pulse 78, temperature 97.6 F (36.4 C), temperature source Oral, resp. rate 20, SpO2 97%.  PHYSICAL EXAMINATION:  Physical Exam  GENERAL:  73 y.o.-year-old Caucasian male patient lying in the bed with no acute distress.  EYES: Pupils equal, round, reactive to light and accommodation. No scleral icterus. Extraocular muscles intact.  HEENT: Head atraumatic, normocephalic. Oropharynx and nasopharynx clear.  NECK:  Supple, no jugular venous distention. No thyroid enlargement, no tenderness.  LUNGS: Normal breath sounds bilaterally, no wheezing, rales,rhonchi or crepitation. No use of accessory muscles of respiration.  CARDIOVASCULAR: Irregularly irregular rhythm, S1, S2 normal. No murmurs, rubs, or gallops.  ABDOMEN: Soft, nondistended, nontender. Bowel sounds present. No organomegaly or mass.  EXTREMITIES: No pedal edema, cyanosis, or clubbing.  NEUROLOGIC: Cranial nerves II through XII are intact. Muscle strength 5/5 in all extremities. Sensation intact. Gait not checked.  PSYCHIATRIC: The patient is alert and oriented x 3.  Normal affect and good eye contact. SKIN: Diffuse right leg erythema, with warmth, induration and tenderness.  He has a right infrapatellar scab at the site of his wound to from his fall.  He has left leg posterior post-dermatitic hyperpigmentation with silvery scaling of psoriasis.  LABORATORY PANEL:   CBC Recent Labs  Lab 08/13/23 2110  WBC 4.6  HGB 11.5*  HCT 33.9*  PLT 222   ------------------------------------------------------------------------------------------------------------------  Chemistries  Recent Labs  Lab  08/13/23 2110  NA 125*  K 4.0  CL 89*  CO2 22  GLUCOSE 91  BUN 13  CREATININE 0.66  CALCIUM 8.8*  AST 21  ALT 22  ALKPHOS 63  BILITOT 1.5*   ------------------------------------------------------------------------------------------------------------------  Cardiac Enzymes No results for input(s): TROPONINI in the last 168 hours. ------------------------------------------------------------------------------------------------------------------  RADIOLOGY:  US  Venous Img Lower Right (DVT Study) Result Date: 08/13/2023 CLINICAL DATA:  Leg swelling for 2-3 days. EXAM: RIGHT LOWER EXTREMITY VENOUS DOPPLER ULTRASOUND TECHNIQUE: Gray-scale sonography with graded compression, as well as color Doppler and duplex ultrasound were performed to evaluate the lower extremity deep venous systems from the level of the common femoral vein and including the common femoral, femoral, profunda femoral, popliteal and calf veins including the posterior tibial, peroneal and gastrocnemius veins when visible. The superficial great saphenous vein was also interrogated. Spectral Doppler was utilized to evaluate flow at rest and with distal augmentation maneuvers in the common femoral, femoral and popliteal veins. COMPARISON:  01/04/2022. FINDINGS: Contralateral Common Femoral Vein: Respiratory phasicity is normal and symmetric with the symptomatic side. No evidence of thrombus. Normal compressibility. Common Femoral Vein: No evidence of thrombus. Normal compressibility, respiratory phasicity and response to augmentation. Saphenofemoral Junction: No evidence of thrombus. Normal compressibility and flow on color Doppler imaging. Profunda Femoral Vein: No evidence of thrombus. Normal compressibility and flow on color Doppler imaging. Femoral Vein: No evidence of thrombus. Normal compressibility, respiratory phasicity and response to augmentation. Popliteal Vein: No evidence of thrombus. Normal compressibility, respiratory  phasicity and response to augmentation. Calf Veins: Evaluation limited by the presence of extensive edema. Peroneal vein not visualized. Posterior tibial vein was normally compressible with flow. Superficial Great Saphenous Vein: No evidence of thrombus. Normal compressibility. Venous Reflux:  None. Other Findings:  None. IMPRESSION: No evidence of deep venous thrombosis. Calf vein visualization was limited. Electronically Signed   By: Fonda Field M.D.   On: 08/13/2023 23:01      IMPRESSION AND PLAN:  Assessment and Plan: * Cellulitis of right lower extremity - The patient will be admitted to a medical bed. - Continue antibiotic therapy with IV Rocephin  and vancomycin . - Pain management will be provided. - Warm compresses will be utilized. - His right lower remedy ultrasound came back negative for DVT.    Hyponatremia - Will obtain hyponatremia workup. - The patient will be hydrated with IV normal saline will follow BMP levels but  Essential hypertension - We will continue antihypertensive therapy.  Atrial fibrillation, chronic (HCC) - We will continue flecainide  and coreg  as well as  Xarelto  and check EKG.  Asthma, chronic - We will continue albuterol  and Singulair .   DVT prophylaxis: Xarelto . Advanced Care Planning:  Code Status: full code.  Family Communication:  The plan of care was discussed in details with the patient (and family). I answered all questions. The patient agreed to proceed with the above mentioned plan. Further management will depend upon hospital course. Disposition Plan: Back to previous home environment Consults called: none.  All the records are reviewed and case discussed with ED provider.  Status is: Inpatient  At the time of the admission, it appears that the appropriate admission status for this patient is inpatient.  This is judged to be reasonable and necessary in order to provide the required intensity of service to ensure the patient's safety  given the presenting symptoms, physical exam findings and initial radiographic and laboratory data in the context of comorbid conditions.  The patient requires inpatient status due to high intensity of service, high risk of further deterioration and high frequency of surveillance required.  I certify that at the time of admission, it is my clinical judgment that the patient will require inpatient hospital care extending more than 2 midnights.                            Dispo: The patient is from: Home              Anticipated d/c is to: Home              Patient currently is not medically stable to d/c.              Difficult to place patient: No  Madison DELENA Peaches M.D on 08/14/2023 at 3:28 AM  [facility]   From 7 PM-7 AM, contact night-coverage [email or web address]  CC: Primary care physician; Rudolpho Norleen BIRCH, MD

## 2023-08-13 NOTE — ED Provider Notes (Addendum)
 Shared visit  Presents to the emergency department with right lower leg redness and swelling.  History of chronic venous stasis.  Patient was started on antibiotic as an outpatient approximately 4 days ago.  Ongoing and worsening redness, swelling and warmth to the right leg.  No fever or chills.  No nausea or vomiting.  No falls or trauma.  No history of DVT.  Significant swelling to bilateral lower extremities that is worse to the right lower leg.  Overlying erythema, warmth and induration.  Findings of chronic venous stasis skins changes.  Vital signs reassuring.  Plan for lab work and likely need for IV antibiotics.  Ultrasound ordered to evaluate for DVT.  Ultrasound DVT was negative.  Consulted hospitalist for admission for cellulitis that is failed outpatient management.   Suzanne Kirsch, MD 08/13/23 2121    Suzanne Kirsch, MD 08/13/23 [phone]

## 2023-08-13 NOTE — ED Provider Notes (Signed)
 [facility] Provider Note    Event Date/Time   First MD Initiated Contact with Patient 08/13/23 1940     (approximate)   History   Leg Pain   HPI  Carl [name] is a 73 y.o. male with PMH of A-fib on Xarelto , HTN, valvular heart disease, Asperger syndrome, asthma, anemia, GERD presents for evaluation of right lower extremity cellulitis.  Patient had a fall on Christmas eve injuring his right knee.  Patient was seen by urgent care on 12/30, diagnosed with infrapatellar bursitis and started on doxycycline.  Patient presented to urgent care again today with increased leg swelling, pain and redness.  Patient was sent from urgent care for cellulitis that has failed outpatient treatment.      Physical Exam   Triage Vital Signs: ED Triage Vitals [08/13/23 1533]  Encounter Vitals Group     BP 130/84     Systolic BP Percentile      Diastolic BP Percentile      Pulse Rate 85     Resp 20     Temp 98.3 F (36.8 C)     Temp Source Oral     SpO2 100 %     Weight      Height      Head Circumference      Peak Flow      Pain Score      Pain Loc      Pain Education      Exclude from Growth Chart     Most recent vital signs: Vitals:   08/13/23 1533  BP: 130/84  Pulse: 85  Resp: 20  Temp: 98.3 F (36.8 C)  SpO2: 100%    General: Awake, no distress.  CV:  Good peripheral perfusion.  Regular rate, irregular rhythm. Resp:  Normal effort.  Clear to auscultation bilaterally. Abd:  No distention.  Other:  Lateral lower legs are swollen with 2+ pitting edema up to the knees, RLE has an abrasion just below the patella, surrounding erythema and warmth.  LLE has skin changes consistent with venous stasis.  Dorsalis pedis pulse palpable.   ED Results / Procedures / Treatments   Labs (all labs ordered are listed, but only abnormal results are displayed) Labs Reviewed  COMPREHENSIVE METABOLIC PANEL - Abnormal; Notable for the following components:       Result Value   Sodium 125 (*)    Chloride 89 (*)    Calcium 8.8 (*)    Total Bilirubin 1.5 (*)    All other components within normal limits  CBC WITH DIFFERENTIAL/PLATELET - Abnormal; Notable for the following components:   Hemoglobin 11.5 (*)    HCT 33.9 (*)    RDW 16.3 (*)    Lymphs Abs 0.6 (*)    All other components within normal limits  CULTURE, BLOOD (SINGLE)  LACTIC ACID, PLASMA  LACTIC ACID, PLASMA   RADIOLOGY  Right lower extremity ultrasound obtained to rule out a DVT.  Results are pending.  PROCEDURES:  Critical Care performed: No  Procedures   MEDICATIONS ORDERED IN ED: Medications - No data to display   IMPRESSION / MDM / ASSESSMENT AND PLAN / ED COURSE  I reviewed the triage vital signs and the nursing notes.                             73 year old male presents for evaluation of right leg pain and swelling.  Vital  signs are stable patient NAD on exam.  Differential diagnosis includes, but is not limited to, cellulitis, DVT, folliculitis, abscess, lymphedema.  Patient's presentation is most consistent with acute complicated illness / injury requiring diagnostic workup.  Patient's physical exam findings are consistent with cellulitis.  Patient has tried taking doxycycline without improvement.  Since he has failed outpatient management I believe he is most appropriate for admission and IV antibiotics.  I will obtain basic labs, lactic as well as 1 set of blood cultures.  I do not have concerns of patient being septic at this time given he is afebrile and has normal vital signs.  CMP notable for hyponatremia.  CBC shows anemia but this does appear to be chronic.  First lactic acid was not elevated.  Blood culture is pending.  DVT study obtained to rule out blood clot as a potential cause for patient's leg swelling.  Results are pending.  Patient takes Xarelto  for A-fib so my suspicion is for DVT is low.  Spoke with the hospitalist about admitting  patient for failure of outpatient treatment of right lower extremity cellulitis.  Dr. Lawence in agreement and will admit patient.     FINAL CLINICAL IMPRESSION(S) / ED DIAGNOSES   Final diagnoses:  Cellulitis of right lower extremity     Rx / DC Orders   ED Discharge Orders     None        Note:  This document was prepared using Dragon voice recognition software and may include unintentional dictation errors.   Cleaster Tinnie LABOR, PA-C 08/13/23 2302    Suzanne Kirsch, MD 08/14/23 [phone]

## 2023-08-14 DIAGNOSIS — I482 Chronic atrial fibrillation, unspecified: Secondary | ICD-10-CM

## 2023-08-14 DIAGNOSIS — I10 Essential (primary) hypertension: Secondary | ICD-10-CM

## 2023-08-14 DIAGNOSIS — J45909 Unspecified asthma, uncomplicated: Secondary | ICD-10-CM | POA: Insufficient documentation

## 2023-08-14 DIAGNOSIS — L03115 Cellulitis of right lower limb: Secondary | ICD-10-CM | POA: Diagnosis not present

## 2023-08-14 LAB — BASIC METABOLIC PANEL
Anion gap: 9 (ref 5–15)
BUN: 11 mg/dL (ref 8–23)
CO2: 24 mmol/L (ref 22–32)
Calcium: 8.1 mg/dL — ABNORMAL LOW (ref 8.9–10.3)
Chloride: 93 mmol/L — ABNORMAL LOW (ref 98–111)
Creatinine, Ser: 0.54 mg/dL — ABNORMAL LOW (ref 0.61–1.24)
GFR, Estimated: >60 mL/min (ref >60)
Glucose, Bld: 110 mg/dL — ABNORMAL HIGH (ref 70–99)
Potassium: 3.9 mmol/L (ref 3.5–5.1)
Sodium: 126 mmol/L — ABNORMAL LOW (ref 135–145)

## 2023-08-14 LAB — CBC
HCT: 31.3 % — ABNORMAL LOW (ref 39.0–52.0)
Hemoglobin: 10.4 g/dL — ABNORMAL LOW (ref 13.0–17.0)
MCH: 27.1 pg (ref 26.0–34.0)
MCHC: 33.2 g/dL (ref 30.0–36.0)
MCV: 81.5 fL (ref 80.0–100.0)
Platelets: 200 10*3/uL (ref 150–400)
RBC: 3.84 MIL/uL — ABNORMAL LOW (ref 4.22–5.81)
RDW: 16.3 % — ABNORMAL HIGH (ref 11.5–15.5)
WBC: 4.4 10*3/uL (ref 4.0–10.5)
nRBC: 0 % (ref 0.0–0.2)

## 2023-08-14 LAB — LACTIC ACID, PLASMA: Lactic Acid, Venous: 1.7 mmol/L (ref 0.5–1.9)

## 2023-08-14 LAB — TSH: TSH: 2.671 u[IU]/mL (ref 0.350–4.500)

## 2023-08-14 LAB — OSMOLALITY: Osmolality: 266 mosm/kg — ABNORMAL LOW (ref 275–295)

## 2023-08-14 LAB — OSMOLALITY, URINE: Osmolality, Ur: 291 mosm/kg — ABNORMAL LOW (ref 300–900)

## 2023-08-14 MED ORDER — VANCOMYCIN HCL IN DEXTROSE 1-5 GM/200ML-% IV SOLN
1000.0000 mg | Freq: Once | INTRAVENOUS | Status: AC
  Administered 2023-08-14: 1000 mg via INTRAVENOUS
  Filled 2023-08-14: qty 200

## 2023-08-14 MED ORDER — VANCOMYCIN HCL 1500 MG/300ML IV SOLN
1500.0000 mg | Freq: Two times a day (BID) | INTRAVENOUS | Status: DC
  Filled 2023-08-14: qty 300

## 2023-08-14 MED ORDER — VANCOMYCIN HCL 1500 MG/300ML IV SOLN
1500.0000 mg | Freq: Once | INTRAVENOUS | Status: AC
  Administered 2023-08-14: 1500 mg via INTRAVENOUS
  Filled 2023-08-14: qty 300

## 2023-08-14 MED ORDER — UREA 15 G PO PACK
15.0000 g | PACK | Freq: Two times a day (BID) | ORAL | Status: DC
  Administered 2023-08-14 – 2023-08-16 (×4): 15 g via ORAL
  Filled 2023-08-14 (×6): qty 1

## 2023-08-14 MED ORDER — METHOCARBAMOL 500 MG PO TABS
500.0000 mg | ORAL_TABLET | Freq: Once | ORAL | Status: AC
  Administered 2023-08-14: 500 mg via ORAL
  Filled 2023-08-14: qty 1

## 2023-08-14 MED ORDER — VANCOMYCIN HCL 2000 MG/400ML IV SOLN
2000.0000 mg | Freq: Two times a day (BID) | INTRAVENOUS | Status: DC
  Administered 2023-08-14 – 2023-08-15 (×2): 2000 mg via INTRAVENOUS
  Filled 2023-08-14 (×3): qty 400

## 2023-08-14 NOTE — Progress Notes (Signed)
 Pharmacy Antibiotic Note  Carl [name] is a 73 y.o. male admitted on 08/13/2023 with cellulitis. Per MD no apparent abscess and signs of systemic infection. History of MRSA infection 10/2022 Pharmacy has been consulted for Vancomycin  dosing.  Today, 08/14/2023 WBC 4.4 Afebrile Scr 0.54 (baseline 0.6) Imaging: negative for DVT Weight from 1/2 at [facility] 127 kg Height from 1/2 [facility] 6'4 BMI 34.1  Plan: Day 1 of antibiotics Increase vancomycin  dose to 2000 mg IV Q12H Goal AUC 400-550. Expected AUC: 482.3 Expected Css min: 13.3 SCr used: 0.8  Weight used: IBW, Vd used: 0.72 (BMI 34.1) Patient is also on ceftriaxone  2 g IV Q24H Continue to monitor cultures and follow with MD to see if MRSA coverage still wanted in setting of MRSA infection history but no apparent abscess or s/sx of systemic infection. Continue to monitor renal function  Temp (24hrs), Avg:97.9 F (36.6 C), Min:97.6 F (36.4 C), Max:98.3 F (36.8 C)  Recent Labs  Lab 08/13/23 2110 08/14/23 0006 08/14/23 0510  WBC 4.6  --  4.4  CREATININE 0.66  --  0.54*  LATICACIDVEN 1.0 1.7  --     CrCl cannot be calculated (Unknown ideal weight.).    No Known Allergies  Antimicrobials this admission:  1/3 Ceftiaxon 2g Q24H >>   1/3 Vancomycin  >>   Dose adjustments this admission: NA  Microbiology results: 1/2 Single BCx: NGTD   Thank you for allowing pharmacy to be a part of this patient's care.  Alfonso MARLA Buys, PharmD Pharmacy Resident  08/14/2023 10:38 AM

## 2023-08-14 NOTE — Assessment & Plan Note (Addendum)
-   We will continue flecainide and coreg as well as Xarelto and check EKG.

## 2023-08-14 NOTE — Assessment & Plan Note (Signed)
-   The patient will be admitted to a medical bed. - Continue antibiotic therapy with IV Rocephin and vancomycin. - Pain management will be provided. - Warm compresses will be utilized. - His right lower remedy ultrasound came back negative for DVT.

## 2023-08-14 NOTE — ED Notes (Signed)
 Pt given urinal to use on stretcher per his request.

## 2023-08-14 NOTE — ED Notes (Signed)
 Patient complaining of leg pain, patient offered pain medications and educated on the benefit of antibiotics for treatment. Patient is refusing medication at this time.

## 2023-08-14 NOTE — Progress Notes (Signed)
 PROGRESS NOTE    Carl [name]  FMW:[id number] DOB: February 08, 1951 DOA: 08/13/2023 PCP: Rudolpho Norleen BIRCH, MD     Brief Narrative:   From admission h and p Carl [name] is a 73 y.o. Caucasian male with medical history significant for asthma, atrial fibrillation, GERD, hypertension, hyponatremia, diverticulosis and psoriasis, who presented to the emergency room with acute onset of worsening right leg swelling with erythema, warmth, and tenderness.  He had a fall on 12/24.  He was seen in urgent care on 12/30 who thought that infrapatellar bursitis for which she was given doxycycline.  Today due to persistent symptoms he was seen again in urgent care and referred to the ER for further management of suspected cellulitis.  He denied any chest pain or palpitations.  No cough or wheezing or hemoptysis.  No fever or chills.  No dysuria, oliguria or hematuria or flank pain.     Assessment & Plan:   Principal Problem:   Cellulitis of right lower extremity Active Problems:   Hyponatremia   Essential hypertension   Atrial fibrillation, chronic (HCC)   Type 2 diabetes mellitus without complication, without long-term current use of insulin (HCC)   Asperger's syndrome   Asthma, chronic  # Venous stasis dermatitis with superimposed cellulitis  PVL no signs DVT (though limited). Cellulitis appears worse on the right. No apparent abscess. No systemic signs/symptoms. 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 No answer when either brother called today  Level of care: Med-Surg Status is: Inpatient Remains inpatient appropriate because: need for iv abx    Consultants:  none  Procedures: none  Antimicrobials:  ceftriaxone     Subjective: No complaints  Objective: Vitals:   08/14/23 0530 08/14/23 0630 08/14/23 0700 08/14/23 0731  BP: (!) 142/82 135/65 137/75 132/73  Pulse: 65 (!) 114 64 74  Resp: 12 10 17 20   Temp:    97.8 F (36.6 C)  TempSrc:    Oral  SpO2: 100% 100% 100% 100%    Intake/Output Summary (Last 24 hours) at 08/14/2023 0932 Last data filed at 08/14/2023 9165 Gross per 24 hour  Intake 300.16 ml  Output 1700 ml  Net -1399.84 ml   There were no vitals filed for this visit.  Examination:  General exam: Appears calm and comfortable  Respiratory system: Clear to auscultation. Respiratory effort normal. Cardiovascular system: S1 & S2 heard, RRR. No JVD, murmurs, rubs, gallops or clicks. No pedal edema. Gastrointestinal system: Abdomen is nondistended, soft and nontender.   Central nervous system: Alert and oriented. No focal neurological deficits. Extremities: Symmetric 5 x 5 power. Skin:  Psychiatry: Judgement and insight appear normal. Mood & affect appropriate.     Data Reviewed: I have personally reviewed following labs and imaging studies  CBC: Recent Labs  Lab 08/13/23 2110 08/14/23 0510  WBC 4.6 4.4  NEUTROABS 3.1  --   HGB 11.5* 10.4*  HCT 33.9* 31.3*  MCV 80.0 81.5  PLT 222 200  Basic Metabolic Panel: Recent Labs  Lab 08/13/23 2110 08/14/23 0510  NA 125* 126*  K 4.0 3.9  CL 89* 93*  CO2 22 24  GLUCOSE 91 110*  BUN 13 11  CREATININE 0.66 0.54*  CALCIUM 8.8* 8.1*   GFR: CrCl cannot be calculated (Unknown ideal weight.). Liver Function Tests: Recent Labs  Lab 08/13/23 2110  AST 21  ALT 22  ALKPHOS 63  BILITOT 1.5*  PROT 7.2  ALBUMIN 3.9   No  results for input(s): LIPASE, AMYLASE in the last 168 hours. No results for input(s): AMMONIA in the last 168 hours. Coagulation Profile: No results for input(s): INR, PROTIME in the last 168 hours. Cardiac Enzymes: No results for input(s): CKTOTAL, CKMB, CKMBINDEX, TROPONINI in the last 168 hours. BNP (last 3 results) No results for input(s): PROBNP in the last 8760 hours. HbA1C: No results for input(s): HGBA1C in the last 72 hours. CBG: No results for input(s): GLUCAP in the last 168 hours. Lipid Profile: No results for input(s): CHOL, HDL, LDLCALC, TRIG, CHOLHDL, LDLDIRECT in the last 72 hours. Thyroid Function Tests: Recent Labs    08/14/23 0510  TSH 2.671   Anemia Panel: No results for input(s): VITAMINB12, FOLATE, FERRITIN, TIBC, IRON, RETICCTPCT in the last 72 hours. Urine analysis:    Component Value Date/Time   COLORURINE STRAW (A) 04/01/2023 1735   APPEARANCEUR CLEAR (A) 04/01/2023 1735   LABSPEC 1.002 (L) 04/01/2023 1735   PHURINE 7.0 04/01/2023 1735   GLUCOSEU NEGATIVE 04/01/2023 1735   HGBUR NEGATIVE 04/01/2023 1735   BILIRUBINUR NEGATIVE 04/01/2023 1735   KETONESUR NEGATIVE 04/01/2023 1735   PROTEINUR NEGATIVE 04/01/2023 1735   NITRITE NEGATIVE 04/01/2023 1735   LEUKOCYTESUR NEGATIVE 04/01/2023 1735   Sepsis Labs: @LABRCNTIP (procalcitonin:4,lacticidven:4)  ) Recent Results (from the past 240 hours)  Blood culture (single)     Status: None (Preliminary result)   Collection Time: 08/13/23  9:10 PM   Specimen: BLOOD  Result Value Ref Range Status   Specimen Description BLOOD BLOOD LEFT HAND  Final   Special Requests   Final    BOTTLES DRAWN AEROBIC AND ANAEROBIC Blood Culture results may not be optimal due to an inadequate volume of blood received in culture bottles   Culture   Final    NO GROWTH < 12 HOURS Performed at [facility], [address], [place], [place] [address]    Report Status  PENDING  Incomplete         Radiology Studies: US  Venous Img Lower Right (DVT Study) Result Date: 08/13/2023 CLINICAL DATA:  Leg swelling for 2-3 days. EXAM: RIGHT LOWER EXTREMITY VENOUS DOPPLER ULTRASOUND TECHNIQUE: Gray-scale sonography with graded compression, as well as color Doppler and duplex ultrasound were performed to evaluate the lower extremity deep venous systems from the level of the common femoral vein and including the common femoral, femoral, profunda femoral, popliteal and calf veins including the posterior tibial, peroneal and gastrocnemius veins when visible. The superficial great saphenous vein was also interrogated. Spectral Doppler was utilized to evaluate flow at rest and with distal augmentation maneuvers in the common femoral, femoral and popliteal veins. COMPARISON:  01/04/2022. FINDINGS: Contralateral Common Femoral Vein: Respiratory phasicity is normal and symmetric with the symptomatic side. No evidence of thrombus. Normal compressibility. Common Femoral Vein: No evidence of thrombus. Normal compressibility, respiratory phasicity and response to augmentation. Saphenofemoral Junction: No evidence of thrombus. Normal compressibility and flow on color Doppler imaging. Profunda Femoral Vein: No evidence of thrombus. Normal compressibility and flow on  color Doppler imaging. Femoral Vein: No evidence of thrombus. Normal compressibility, respiratory phasicity and response to augmentation. Popliteal Vein: No evidence of thrombus. Normal compressibility, respiratory phasicity and response to augmentation. Calf Veins: Evaluation limited by the presence of extensive edema. Peroneal vein not visualized. Posterior tibial vein was normally compressible with flow. Superficial Great Saphenous Vein: No evidence of thrombus. Normal compressibility. Venous Reflux:  None. Other Findings:  None. IMPRESSION: No evidence of deep venous thrombosis. Calf vein visualization was limited. Electronically  Signed   By: Fonda Field M.D.   On: 08/13/2023 23:01        Scheduled Meds:  amLODipine   5 mg Oral Daily   ascorbic acid   1,000 mg Oral Daily   atenolol   25 mg Oral BID   flecainide   100 mg Oral BID   folic acid   1 mg Oral Daily   furosemide   20 mg Oral BID   lisinopril   10 mg Oral Daily   loratadine   10 mg Oral Daily   montelukast   10 mg Oral QHS   multivitamin  1 tablet Oral Daily   multivitamin with minerals  1 tablet Oral Daily   pantoprazole   40 mg Oral Daily   polyethylene glycol  17 g Oral Daily   rivaroxaban   20 mg Oral Q supper   senna-docusate  1 tablet Oral Daily   sodium chloride   1 g Oral TID WC   Continuous Infusions:  cefTRIAXone  (ROCEPHIN )  IV Stopped (08/14/23 0112)   vancomycin        LOS: 1 day     Devaughn KATHEE Ban, MD [facility]   If 7PM-7AM, please contact night-coverage [email or web address] Password [email or web address] 08/14/2023, 9:32 AM

## 2023-08-14 NOTE — ED Notes (Signed)
 Pt sitting up on stretcher watching television. Asking for hospital bed. Very talkative and pleasant. No distress noted.

## 2023-08-14 NOTE — Progress Notes (Signed)
 Pharmacy Antibiotic Note  Carl [name] is a 73 y.o. male admitted on 08/13/2023 with cellulitis.  Pharmacy has been consulted for Vancomycin  dosing.  Plan: Vancomycin  1 gm IV X 1 followed by Vanc 1500 mg IV X 1 to make total loading dose of 2500 mg ordered for 1/3 @ 0400.  Vancomycin  1500 mg IV Q12H ordered to start on 1/3 @ 1700.  AUC = 520.9 Vanc trough = 13.7      Temp (24hrs), Avg:98 F (36.7 C), Min:97.6 F (36.4 C), Max:98.3 F (36.8 C)  Recent Labs  Lab 08/13/23 2110 08/14/23 0006  WBC 4.6  --   CREATININE 0.66  --   LATICACIDVEN 1.0 1.7    CrCl cannot be calculated (Unknown ideal weight.).    No Known Allergies  Antimicrobials this admission:   >>    >>   Dose adjustments this admission:   Microbiology results:  BCx:   UCx:    Sputum:    MRSA PCR:   Thank you for allowing pharmacy to be a part of this patient's care.  Carl [name] D 08/14/2023 4:03 AM

## 2023-08-14 NOTE — Assessment & Plan Note (Signed)
-   We will continue antihypertensive therapy.

## 2023-08-14 NOTE — Progress Notes (Signed)
 PHARMACIST - PHYSICIAN ORDER COMMUNICATION  CONCERNING: P&T Medication Policy on Herbal Medications  DESCRIPTION:  This patient's order for:  Zinc  citrate-phytase  has been noted.  This product(s) is classified as an "herbal" or natural product. Due to a lack of definitive safety studies or FDA approval, nonstandard manufacturing practices, plus the potential risk of unknown drug-drug interactions while on inpatient medications, the Pharmacy and Therapeutics Committee does not permit the use of "herbal" or natural products of this type within [facility].   ACTION TAKEN: The pharmacy department is unable to verify this order at this time and your patient has been informed of this safety policy. Please reevaluate patient's clinical condition at discharge and address if the herbal or natural product(s) should be resumed at that time.

## 2023-08-14 NOTE — Consult Note (Addendum)
 WOC Nurse Consult Note: Reason for Consult: Consult requested for bilat legs.  Performed remotely after review of progress notes and photos in the EMR. Pt has generalized edema and chronic partial thickness stasis ulcers.  He has cellulitis with erythremia and mod amt weeping drainage. He is on systemic antibiotics for the cellulitis.  Dressing procedure/placement/frequency: Topical treatment orders provided for bedside nurses to perform as follows to reduce edema and absorb drainage: Apply Xeroform gauze and then ABD pads and kerelx to bilat legs Q day, beginning just behind toes to below knees, then cover with ace wraps in the same manner. Please re-consult if further assistance is needed.  Thank-you,  Stephane Fought MSN, RN, CWOCN, [place], CNS [phone]

## 2023-08-14 NOTE — ED Notes (Signed)
 Pt assisted to restroom and then moved to hospital bed from ED stretcher. Pt c/o bed being too short for him and asking for it to be lengthened. Charge nurse aware. Provided for comfort and safety.

## 2023-08-14 NOTE — Assessment & Plan Note (Signed)
-   We will continue albuterol and Singulair.

## 2023-08-14 NOTE — Assessment & Plan Note (Signed)
-   Will obtain hyponatremia workup. - The patient will be hydrated with IV normal saline will follow BMP levels but

## 2023-08-15 DIAGNOSIS — L03115 Cellulitis of right lower limb: Secondary | ICD-10-CM | POA: Diagnosis not present

## 2023-08-15 LAB — BASIC METABOLIC PANEL
Anion gap: 8 (ref 5–15)
BUN: 14 mg/dL (ref 8–23)
CO2: 25 mmol/L (ref 22–32)
Calcium: 8.6 mg/dL — ABNORMAL LOW (ref 8.9–10.3)
Chloride: 95 mmol/L — ABNORMAL LOW (ref 98–111)
Creatinine, Ser: 0.67 mg/dL (ref 0.61–1.24)
GFR, Estimated: >60 mL/min (ref >60)
Glucose, Bld: 104 mg/dL — ABNORMAL HIGH (ref 70–99)
Potassium: 4.2 mmol/L (ref 3.5–5.1)
Sodium: 128 mmol/L — ABNORMAL LOW (ref 135–145)

## 2023-08-15 LAB — CBC
HCT: 32.2 % — ABNORMAL LOW (ref 39.0–52.0)
Hemoglobin: 10.7 g/dL — ABNORMAL LOW (ref 13.0–17.0)
MCH: 27 pg (ref 26.0–34.0)
MCHC: 33.2 g/dL (ref 30.0–36.0)
MCV: 81.1 fL (ref 80.0–100.0)
Platelets: 217 10*3/uL (ref 150–400)
RBC: 3.97 MIL/uL — ABNORMAL LOW (ref 4.22–5.81)
RDW: 16.2 % — ABNORMAL HIGH (ref 11.5–15.5)
WBC: 5.4 10*3/uL (ref 4.0–10.5)
nRBC: 0 % (ref 0.0–0.2)

## 2023-08-15 LAB — GLUCOSE, CAPILLARY
Glucose-Capillary: 173 mg/dL — ABNORMAL HIGH (ref 70–99)
Glucose-Capillary: 120 mg/dL — ABNORMAL HIGH (ref 70–99)

## 2023-08-15 NOTE — Plan of Care (Signed)

## 2023-08-15 NOTE — Evaluation (Signed)
 Physical Therapy Evaluation Patient Details Name: Carl [name] MRN: [id number] DOB: [birth year] Today's Date: 08/15/2023  History of Present Illness  presented to ER Secondary to R LE swelling, erythema, warmth; admitted for management of R LE cellulitis.  Clinical Impression  Patient resting in bed upon arrival to room; alert and oriented, follows commands and agreeable to participation with session.  Denies pain; bilat LE wraps in place.  Bilat UE/LE strength and ROM grossly symmetrical and WFL; no focal weakness appreciated.  Able to complete bed mobility indep; sit/stand, basic transfers and gait (700') without assist device, sup/mod indep.  Demonstrates forward flexed, kyphotic posture (limited ability to manage head turns as result); broad BOS with slightly choppy stepping pattern, limited heel strike/toe off bilat (tends to ER forefoot at heel strike to assist with foot clearance). Steady cadence; limited ability to modulate gait speed. No overt buckling or LOB; endorses gait performance is baseline for him.  Appears to be at functional baseline; no acute PT needs identified.  Will complete orders at this time; please reconsult should needs change.        If plan is discharge home, recommend the following:     Can travel by private vehicle        Equipment Recommendations  (no equip needed)  Recommendations for Other Services       Functional Status Assessment Patient has not had a recent decline in their functional status     Precautions / Restrictions Precautions Precautions: None Restrictions Weight Bearing Restrictions Per Provider Order: No      Mobility  Bed Mobility Overal bed mobility: Independent                  Transfers Overall transfer level: Modified independent Equipment used: None                    Ambulation/Gait Ambulation/Gait assistance: Supervision, Modified independent (Device/Increase time) Gait Distance (Feet): 700  Feet Assistive device: None   Gait velocity: 10' walk time, 12 seconds Gait velocity interpretation: <1.31 ft/sec, indicative of household ambulator   General Gait Details: forward flexed, kyphotic posture (limited ability to manage head turns as result); broad BOS with slightly choppy stepping pattern, limited heel strike/toe off bilat (tends to ER forefoot at heel strike to assist with foot clearance).  Steady cadence; limited ability to modulate gait speed.  No overt buckling or LOB; endorses gait performance is baseline for him.  Stairs            Wheelchair Mobility     Tilt Bed    Modified Rankin (Stroke Patients Only)       Balance Overall balance assessment: Needs assistance Sitting-balance support: No upper extremity supported, Feet supported Sitting balance-Leahy Scale: Normal     Standing balance support: No upper extremity supported Standing balance-Leahy Scale: Good Standing balance comment: limited functional reach, but good awareness of limits of stability, good use of safety/compensatory strategies as needed                             Pertinent Vitals/Pain Pain Assessment Pain Assessment: No/denies pain    Home Living Family/patient expects to be discharged to:: Private residence Living Arrangements: Alone Available Help at Discharge: Family;Neighbor Type of Home: House Home Access: Stairs to enter Entrance Stairs-Rails: Right;Left;Can reach both Secretary/administrator of Steps: 2-3   Home Layout: One level Home Equipment: None      Prior Function  Prior Level of Function : Independent/Modified Independent             Mobility Comments: Indep with ADLs, household and community mobilization without assist device.  Endorses single fall within previous six months (tripped over curb)       Extremity/Trunk Assessment   Upper Extremity Assessment Upper Extremity Assessment: Overall WFL for tasks assessed    Lower Extremity  Assessment Lower Extremity Assessment: Overall WFL for tasks assessed    Cervical / Trunk Assessment Cervical / Trunk Assessment: Kyphotic (forward flexed, kyphotic posture)  Communication   Communication Communication: No apparent difficulties  Cognition Arousal: Alert Behavior During Therapy: WFL for tasks assessed/performed Overall Cognitive Status: Within Functional Limits for tasks assessed                                          General Comments      Exercises     Assessment/Plan    PT Assessment Patient does not need any further PT services  PT Problem List         PT Treatment Interventions      PT Goals (Current goals can be found in the Care Plan section)  Acute Rehab PT Goals Patient Stated Goal: to go home PT Goal Formulation: With patient Time For Goal Achievement: 08/29/23 Potential to Achieve Goals: Good    Frequency       Co-evaluation               AM-PAC PT 6 Clicks Mobility  Outcome Measure Help needed turning from your back to your side while in a flat bed without using bedrails?: None Help needed moving from lying on your back to sitting on the side of a flat bed without using bedrails?: None Help needed moving to and from a bed to a chair (including a wheelchair)?: None Help needed standing up from a chair using your arms (e.g., wheelchair or bedside chair)?: None Help needed to walk in hospital room?: None Help needed climbing 3-5 steps with a railing? : None 6 Click Score: 24    End of Session Equipment Utilized During Treatment: Gait belt Activity Tolerance: Patient tolerated treatment well Patient left: in bed;with call bell/phone within reach;with bed alarm set Nurse Communication: Mobility status PT Visit Diagnosis: Difficulty in walking, not elsewhere classified (R26.2)    Time: 1526-1550 PT Time Calculation (min) (ACUTE ONLY): 24 min   Charges:   PT Evaluation $PT Eval Low Complexity: 1 Low    PT General Charges $$ ACUTE PT VISIT: 1 Visit         Derk Doubek H. Delores, PT, DPT, NCS 08/15/23, 4:05 PM [phone]

## 2023-08-15 NOTE — Progress Notes (Signed)
 PROGRESS NOTE    Carl [name]  FMW:[id number] DOB: 1951/03/30 DOA: 08/13/2023 PCP: Rudolpho Norleen BIRCH, MD     Brief Narrative:   From admission h and p Carl [name] is a 73 y.o. Caucasian male with medical history significant for asthma, atrial fibrillation, GERD, hypertension, hyponatremia, diverticulosis and psoriasis, who presented to the emergency room with acute onset of worsening right leg swelling with erythema, warmth, and tenderness.  He had a fall on 12/24.  He was seen in urgent care on 12/30 who thought that infrapatellar bursitis for which she was given doxycycline.  Today due to persistent symptoms he was seen again in urgent care and referred to the ER for further management of suspected cellulitis.  He denied any chest pain or palpitations.  No cough or wheezing or hemoptysis.  No fever or chills.  No dysuria, oliguria or hematuria or flank pain.     Assessment & Plan:   Principal Problem:   Cellulitis of right lower extremity Active Problems:   Hyponatremia   Essential hypertension   Atrial fibrillation, chronic (HCC)   Type 2 diabetes mellitus without complication, without long-term current use of insulin (HCC)   Asperger's syndrome   Asthma, chronic  # Venous stasis dermatitis with superimposed cellulitis  PVL no signs DVT (though limited). Cellulitis appears worse on the right. No apparent abscess. No systemic signs/symptoms. Started on doxy outpt few days prior to arrival. 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 IV fluids started on admission have been discontinued - continue fluid restriction - cont home lasix  - resumed home sodium  # A-fib, chronic Rate controlled - cont home atenolol , flecainide , xarelto   # HTN Bp appropriate - cont home lisinopril , amlodipine   # Asthma No exacerbation - home albuterol  prn, singulair   # Autism spectrum disorder No behavioral  disturbance  DVT prophylaxis: xarelto  Code Status: full Family Communication: brother Carl [name] updated telephonically 1/4  Level of care: Med-Surg Status is: Inpatient Remains inpatient appropriate because: need for iv abx    Consultants:  none  Procedures: none  Antimicrobials:  ceftriaxone     Subjective: No complaints. Reports interval improvement in right leg pain/swelling  Objective: Vitals:   08/14/23 1822 08/14/23 2006 08/15/23 0005 08/15/23 0742  BP: 131/78  114/83 135/85  Pulse: 62  (!) 103 (!) 55  Resp: 17 16 18 16   Temp: 97.8 F (36.6 C)  97.7 F (36.5 C) 98.3 F (36.8 C)  TempSrc:      SpO2: 99%  99% 97%  Weight:      Height:        Intake/Output Summary (Last 24 hours) at 08/15/2023 1514 Last data filed at 08/15/2023 1030 Gross per 24 hour  Intake 840 ml  Output 3300 ml  Net -2460 ml   Filed Weights   08/14/23 1000  Weight: 127 kg    Examination:  General exam: Appears calm and comfortable  Respiratory system: Clear to auscultation. Respiratory effort normal. Cardiovascular system: S1 & S2 heard, RRR. No JVD, murmurs, rubs, gallops or clicks. No pedal edema. Gastrointestinal system: Abdomen is nondistended, soft and nontender.   Central nervous system: Alert and oriented. No focal neurological deficits. Extremities: Symmetric 5 x 5 power. Skin:  Interval improvement Psychiatry: Judgement and insight appear normal. Mood & affect appropriate.     Data Reviewed: I have personally reviewed following labs and imaging studies  CBC: Recent Labs  Lab 08/13/23 2110 08/14/23 0510 08/15/23 9651  WBC 4.6 4.4 5.4  NEUTROABS 3.1  --   --   HGB 11.5* 10.4* 10.7*  HCT 33.9* 31.3* 32.2*  MCV 80.0 81.5 81.1  PLT 222 200 217   Basic Metabolic Panel: Recent Labs  Lab 08/13/23 2110 08/14/23 0510 08/15/23 0348  NA 125* 126* 128*  K 4.0 3.9 4.2  CL 89* 93* 95*  CO2 22 24 25   GLUCOSE 91 110* 104*  BUN 13 11 14   CREATININE 0.66 0.54* 0.67   CALCIUM 8.8* 8.1* 8.6*   GFR: Estimated Creatinine Clearance: 123.3 mL/min (by C-G formula based on SCr of 0.67 mg/dL). Liver Function Tests: Recent Labs  Lab 08/13/23 2110  AST 21  ALT 22  ALKPHOS 63  BILITOT 1.5*  PROT 7.2  ALBUMIN 3.9   No results for input(s): LIPASE, AMYLASE in the last 168 hours. No results for input(s): AMMONIA in the last 168 hours. Coagulation Profile: No results for input(s): INR, PROTIME in the last 168 hours. Cardiac Enzymes: No results for input(s): CKTOTAL, CKMB, CKMBINDEX, TROPONINI in the last 168 hours. BNP (last 3 results) No results for input(s): PROBNP in the last 8760 hours. HbA1C: No results for input(s): HGBA1C in the last 72 hours. CBG: Recent Labs  Lab 08/15/23 1207  GLUCAP 173*   Lipid Profile: No results for input(s): CHOL, HDL, LDLCALC, TRIG, CHOLHDL, LDLDIRECT in the last 72 hours. Thyroid Function Tests: Recent Labs    08/14/23 0510  TSH 2.671   Anemia Panel: No results for input(s): VITAMINB12, FOLATE, FERRITIN, TIBC, IRON, RETICCTPCT in the last 72 hours. Urine analysis:    Component Value Date/Time   COLORURINE STRAW (A) 04/01/2023 1735   APPEARANCEUR CLEAR (A) 04/01/2023 1735   LABSPEC 1.002 (L) 04/01/2023 1735   PHURINE 7.0 04/01/2023 1735   GLUCOSEU NEGATIVE 04/01/2023 1735   HGBUR NEGATIVE 04/01/2023 1735   BILIRUBINUR NEGATIVE 04/01/2023 1735   KETONESUR NEGATIVE 04/01/2023 1735   PROTEINUR NEGATIVE 04/01/2023 1735   NITRITE NEGATIVE 04/01/2023 1735   LEUKOCYTESUR NEGATIVE 04/01/2023 1735   Sepsis Labs: @LABRCNTIP (procalcitonin:4,lacticidven:4)  ) Recent Results (from the past 240 hours)  Blood culture (single)     Status: None (Preliminary result)   Collection Time: 08/13/23  9:10 PM   Specimen: BLOOD  Result Value Ref Range Status   Specimen Description BLOOD BLOOD LEFT HAND  Final   Special Requests   Final    BOTTLES DRAWN AEROBIC AND ANAEROBIC  Blood Culture results may not be optimal due to an inadequate volume of blood received in culture bottles   Culture   Final    NO GROWTH 2 DAYS Performed at [facility], [address], [place], [place] [address]    Report Status PENDING  Incomplete         Radiology Studies: US  Venous Img Lower Right (DVT Study) Result Date: 08/13/2023 CLINICAL DATA:  Leg swelling for 2-3 days. EXAM: RIGHT LOWER EXTREMITY VENOUS DOPPLER ULTRASOUND TECHNIQUE: Gray-scale sonography with graded compression, as well as color Doppler and duplex ultrasound were performed to evaluate the lower extremity deep venous systems from the level of the common femoral vein and including the common femoral, femoral, profunda femoral, popliteal and calf veins including the posterior tibial, peroneal and gastrocnemius veins when visible. The superficial great saphenous vein was also interrogated. Spectral Doppler was utilized to evaluate flow at rest and with distal augmentation maneuvers in the common femoral, femoral and popliteal veins. COMPARISON:  01/04/2022. FINDINGS: Contralateral Common Femoral Vein: Respiratory phasicity is normal and  symmetric with the symptomatic side. No evidence of thrombus. Normal compressibility. Common Femoral Vein: No evidence of thrombus. Normal compressibility, respiratory phasicity and response to augmentation. Saphenofemoral Junction: No evidence of thrombus. Normal compressibility and flow on color Doppler imaging. Profunda Femoral Vein: No evidence of thrombus. Normal compressibility and flow on color Doppler imaging. Femoral Vein: No evidence of thrombus. Normal compressibility, respiratory phasicity and response to augmentation. Popliteal Vein: No evidence of thrombus. Normal compressibility, respiratory phasicity and response to augmentation. Calf Veins: Evaluation limited by the presence of extensive edema. Peroneal vein not visualized. Posterior tibial vein was normally compressible  with flow. Superficial Great Saphenous Vein: No evidence of thrombus. Normal compressibility. Venous Reflux:  None. Other Findings:  None. IMPRESSION: No evidence of deep venous thrombosis. Calf vein visualization was limited. Electronically Signed   By: Fonda Field M.D.   On: 08/13/2023 23:01        Scheduled Meds:  amLODipine   5 mg Oral Daily   ascorbic acid   1,000 mg Oral Daily   atenolol   25 mg Oral BID   flecainide   100 mg Oral BID   folic acid   1 mg Oral Daily   furosemide   20 mg Oral BID   lisinopril   10 mg Oral Daily   loratadine   10 mg Oral Daily   montelukast   10 mg Oral QHS   multivitamin  1 tablet Oral Daily   multivitamin with minerals  1 tablet Oral Daily   pantoprazole   40 mg Oral Daily   polyethylene glycol  17 g Oral Daily   rivaroxaban   20 mg Oral Q supper   senna-docusate  1 tablet Oral Daily   sodium chloride   1 g Oral TID WC   urea   15 g Oral BID   Continuous Infusions:  cefTRIAXone  (ROCEPHIN )  IV Stopped (08/15/23 0015)     LOS: 2 days     Devaughn KATHEE Ban, MD [facility]   If 7PM-7AM, please contact night-coverage [email or web address] Password [facility] 08/15/2023, 3:14 PM

## 2023-08-16 DIAGNOSIS — L03115 Cellulitis of right lower limb: Secondary | ICD-10-CM | POA: Diagnosis not present

## 2023-08-16 LAB — BASIC METABOLIC PANEL
Anion gap: 9 (ref 5–15)
BUN: 22 mg/dL (ref 8–23)
CO2: 22 mmol/L (ref 22–32)
Calcium: 8.5 mg/dL — ABNORMAL LOW (ref 8.9–10.3)
Chloride: 98 mmol/L (ref 98–111)
Creatinine, Ser: 0.65 mg/dL (ref 0.61–1.24)
GFR, Estimated: >60 mL/min (ref >60)
Glucose, Bld: 102 mg/dL — ABNORMAL HIGH (ref 70–99)
Potassium: 4.4 mmol/L (ref 3.5–5.1)
Sodium: 129 mmol/L — ABNORMAL LOW (ref 135–145)

## 2023-08-16 LAB — CBC
HCT: 32.9 % — ABNORMAL LOW (ref 39.0–52.0)
Hemoglobin: 11 g/dL — ABNORMAL LOW (ref 13.0–17.0)
MCH: 26.8 pg (ref 26.0–34.0)
MCHC: 33.4 g/dL (ref 30.0–36.0)
MCV: 80.2 fL (ref 80.0–100.0)
Platelets: 238 10*3/uL (ref 150–400)
RBC: 4.1 MIL/uL — ABNORMAL LOW (ref 4.22–5.81)
RDW: 16.2 % — ABNORMAL HIGH (ref 11.5–15.5)
WBC: 5.7 10*3/uL (ref 4.0–10.5)
nRBC: 0.5 % — ABNORMAL HIGH (ref 0.0–0.2)

## 2023-08-16 MED ORDER — SULFAMETHOXAZOLE-TRIMETHOPRIM 800-160 MG PO TABS: 2.0000 | ORAL_TABLET | Freq: Two times a day (BID) | ORAL | 0 refills | Status: AC

## 2023-08-16 NOTE — TOC Transition Note (Signed)
 Transition of Care [facility]) - Discharge Note   Patient Details  Name: Carl [name] MRN: [id number] Date of Birth: [birth year]  Transition of Care [facility]) CM/SW Contact:  Racheal LITTIE Schimke, RN Phone Number: 08/16/2023, 1:32 PM   Clinical Narrative:  Discharge home via [employer] form signed and placed in chart on the unit.     Final next level of care: Home/Self Care Barriers to Discharge: Barriers Resolved, No Barriers Identified   Patient Goals and CMS Choice            Discharge Placement                    Patient and family notified of of transfer: 08/16/23  Discharge Plan and Services Additional resources added to the After Visit Summary for                  DME Arranged: N/A DME Agency: NA       HH Arranged: NA HH Agency: NA        Social Drivers of Health (SDOH) Interventions SDOH Screenings   Food Insecurity: No Food Insecurity (08/14/2023)  Housing: Low Risk  (01/31/2023)  Transportation Needs: No Transportation Needs (01/31/2023)  Utilities: Not At Risk (01/31/2023)  Tobacco Use: Low Risk  (08/13/2023)     Readmission Risk Interventions     No data to display

## 2023-08-16 NOTE — Discharge Summary (Signed)
 Carl [name] FMW:[id number] DOB: [birth year] DOA: 08/13/2023  PCP: Rudolpho Norleen BIRCH, MD  Admit date: 08/13/2023 Discharge date: 08/16/2023  Time spent: 35 minutes  Recommendations for Outpatient Follow-up:  Pcp f/u  Vascular surgery f/u (referred) Monitor sodium     Discharge Diagnoses:  Principal Problem:   Cellulitis of right lower extremity Active Problems:   Hyponatremia   Essential hypertension   Atrial fibrillation, chronic (HCC)   Type 2 diabetes mellitus without complication, without long-term current use of insulin (HCC)   Asperger's syndrome   Asthma, chronic   Discharge Condition: improved  Diet recommendation: heart healthy  Filed Weights   08/14/23 1000  Weight: 127 kg    History of present illness:  From admission h and p Carl [name] is a 73 y.o. Caucasian male with medical history significant for asthma, atrial fibrillation, GERD, hypertension, hyponatremia, diverticulosis and psoriasis, who presented to the emergency room with acute onset of worsening right leg swelling with erythema, warmth, and tenderness.  He had a fall on 12/24.  He was seen in urgent care on 12/30 who thought that infrapatellar bursitis for which she was given doxycycline.  Today due to persistent symptoms he was seen again in urgent care and referred to the ER for further management of suspected cellulitis.  He denied any chest pain or palpitations.  No cough or wheezing or hemoptysis.  No fever or chills.  No dysuria, oliguria or hematuria or flank pain.   Hospital Course:  Patient with chronic venous stasis and venous stasis dermatitis presents with worsening erythema and swelling of the right lower extremity, started on doxycycline as outpatient. Also had a fall with abrasion to right knee. Here he was treated with ceftriaxone  and compression therapy and swelling/erythema much improved, will discharge to complete a course of bactrim  for this presumed cellulitis superimposed  on venous stasis dermatitis. No signs/symptoms of systemic infection here. Outpt pvl negative for DVT. He does have a fluctuant area behind the abrasion of the right knee, was evaluated by orthopedics (dr. Maryrose) who says (not yet yet finished but I discussed with him in person) that this is likely a hematoma (patient is anticoagulated), does not need to be drained, should instead be monitored. Patient has pcp f/u in on week, advised f/u then, also referred to vascular surgery for his chronic venous stasis disease.   Procedures: none   Consultations: orthopedics  Discharge Exam: Vitals:   08/16/23 0018 08/16/23 0807  BP: 130/80 119/70  Pulse: 81 (!) 59  Resp: 17 16  Temp: 98.4 F (36.9 C) 98 F (36.7 C)  SpO2: 97% 97%    General: NAD Cardiovascular: RRR Respiratory: CTAB Ext: interval improvement in erythema and swelling of RLE  Discharge Instructions   Discharge Instructions     Ambulatory referral to Vascular Surgery   Complete by: As directed    Diet - low sodium heart healthy   Complete by: As directed    Increase activity slowly   Complete by: As directed    No wound care   Complete by: As directed       Allergies as of 08/16/2023   No Known Allergies      Medication List     TAKE these medications    albuterol  108 (90 Base) MCG/ACT inhaler Commonly known as: VENTOLIN  HFA Inhale 2 puffs into the lungs every 6 (six) hours as needed for wheezing.   alum & mag hydroxide-simeth 200-200-20 MG/5ML suspension Commonly known as: MAALOX/MYLANTA Take 30  mLs by mouth every 6 (six) hours as needed for indigestion or heartburn.   amLODipine  5 MG tablet Commonly known as: NORVASC  Take 5 mg by mouth daily.   ascorbic acid  1000 MG tablet Commonly known as: VITAMIN C  Take 1,000 mg by mouth daily.   atenolol  25 MG tablet Commonly known as: TENORMIN  Take 25 mg by mouth 2 (two) times daily.   B COMPLEX PO Take 1 tablet by mouth daily.   bisacodyl  5 MG EC  tablet Commonly known as: DULCOLAX Take 1 tablet (5 mg total) by mouth daily as needed for moderate constipation.   cetirizine 10 MG tablet Commonly known as: ZYRTEC Take 10 mg by mouth daily.   flecainide  100 MG tablet Commonly known as: TAMBOCOR  Take 100 mg by mouth 2 (two) times daily.   folic acid  1 MG tablet Commonly known as: FOLVITE  Take 1 mg by mouth daily.   furosemide  20 MG tablet Commonly known as: Lasix  Take 1 tablet (20 mg total) by mouth 2 (two) times daily.   Garlic  100 MG Tabs Take 100 mg by mouth daily.   hyoscyamine  0.375 MG 12 hr tablet Commonly known as: LEVBID  Take 1 tablet by mouth in the morning and at bedtime.   lisinopril  10 MG tablet Commonly known as: ZESTRIL  Take 10 mg by mouth daily.   montelukast  10 MG tablet Commonly known as: SINGULAIR  Take 10 mg by mouth at bedtime.   multivitamin tablet Take 1 tablet by mouth daily.   pantoprazole  40 MG tablet Commonly known as: PROTONIX  Take 40 mg by mouth daily.   polyethylene glycol 17 g packet Commonly known as: MIRALAX  / GLYCOLAX  Take 17 g by mouth daily.   QC TUMERIC COMPLEX PO Take 1 tablet by mouth daily.   rivaroxaban  20 MG Tabs tablet Commonly known as: XARELTO  Take 20 mg by mouth daily with supper.   SALMON OIL PO Take 1 capsule by mouth daily.   senna-docusate 8.6-50 MG tablet Commonly known as: Senokot-S Take 1 tablet by mouth daily.   sodium chloride  1 g tablet Take 1 tablet (1 g total) by mouth 3 (three) times daily with meals.   sulfamethoxazole -trimethoprim  800-160 MG tablet Commonly known as: BACTRIM  DS Take 2 tablets by mouth 2 (two) times daily for 5 days.   urea  15 g Pack oral packet Commonly known as: URE-NA Take 15 g by mouth 2 (two) times daily.   Zinc  Citrate-Phytase 25-500 MG Caps Take 1 capsule by mouth daily.       No Known Allergies  Follow-up Information     Rudolpho Norleen BIRCH, MD Follow up.   Specialty: Internal Medicine Contact  information: [address] [facility] [place] [address] [phone]                  The results of significant diagnostics from this hospitalization (including imaging, microbiology, ancillary and laboratory) are listed below for reference.    Significant Diagnostic Studies: US  Venous Img Lower Right (DVT Study) Result Date: 08/13/2023 CLINICAL DATA:  Leg swelling for 2-3 days. EXAM: RIGHT LOWER EXTREMITY VENOUS DOPPLER ULTRASOUND TECHNIQUE: Gray-scale sonography with graded compression, as well as color Doppler and duplex ultrasound were performed to evaluate the lower extremity deep venous systems from the level of the common femoral vein and including the common femoral, femoral, profunda femoral, popliteal and calf veins including the posterior tibial, peroneal and gastrocnemius veins when visible. The superficial great saphenous vein was also interrogated. Spectral Doppler was utilized to  evaluate flow at rest and with distal augmentation maneuvers in the common femoral, femoral and popliteal veins. COMPARISON:  01/04/2022. FINDINGS: Contralateral Common Femoral Vein: Respiratory phasicity is normal and symmetric with the symptomatic side. No evidence of thrombus. Normal compressibility. Common Femoral Vein: No evidence of thrombus. Normal compressibility, respiratory phasicity and response to augmentation. Saphenofemoral Junction: No evidence of thrombus. Normal compressibility and flow on color Doppler imaging. Profunda Femoral Vein: No evidence of thrombus. Normal compressibility and flow on color Doppler imaging. Femoral Vein: No evidence of thrombus. Normal compressibility, respiratory phasicity and response to augmentation. Popliteal Vein: No evidence of thrombus. Normal compressibility, respiratory phasicity and response to augmentation. Calf Veins: Evaluation limited by the presence of extensive edema. Peroneal vein not visualized. Posterior tibial vein was normally  compressible with flow. Superficial Great Saphenous Vein: No evidence of thrombus. Normal compressibility. Venous Reflux:  None. Other Findings:  None. IMPRESSION: No evidence of deep venous thrombosis. Calf vein visualization was limited. Electronically Signed   By: Fonda Field M.D.   On: 08/13/2023 23:01    Microbiology: Recent Results (from the past 240 hours)  Blood culture (single)     Status: None (Preliminary result)   Collection Time: 08/13/23  9:10 PM   Specimen: BLOOD  Result Value Ref Range Status   Specimen Description BLOOD BLOOD LEFT HAND  Final   Special Requests   Final    BOTTLES DRAWN AEROBIC AND ANAEROBIC Blood Culture results may not be optimal due to an inadequate volume of blood received in culture bottles   Culture   Final    NO GROWTH 3 DAYS Performed at [facility], [address] [place], [place] [address]    Report Status PENDING  Incomplete     Labs: Basic Metabolic Panel: Recent Labs  Lab 08/13/23 2110 08/14/23 0510 08/15/23 0348 08/16/23 0541  NA 125* 126* 128* 129*  K 4.0 3.9 4.2 4.4  CL 89* 93* 95* 98  CO2 22 24 25 22   GLUCOSE 91 110* 104* 102*  BUN 13 11 14 22   CREATININE 0.66 0.54* 0.67 0.65  CALCIUM 8.8* 8.1* 8.6* 8.5*   Liver Function Tests: Recent Labs  Lab 08/13/23 2110  AST 21  ALT 22  ALKPHOS 63  BILITOT 1.5*  PROT 7.2  ALBUMIN 3.9   No results for input(s): LIPASE, AMYLASE in the last 168 hours. No results for input(s): AMMONIA in the last 168 hours. CBC: Recent Labs  Lab 08/13/23 2110 08/14/23 0510 08/15/23 0348 08/16/23 0541  WBC 4.6 4.4 5.4 5.7  NEUTROABS 3.1  --   --   --   HGB 11.5* 10.4* 10.7* 11.0*  HCT 33.9* 31.3* 32.2* 32.9*  MCV 80.0 81.5 81.1 80.2  PLT 222 200 217 238   Cardiac Enzymes: No results for input(s): CKTOTAL, CKMB, CKMBINDEX, TROPONINI in the last 168 hours. BNP: BNP (last 3 results) Recent Labs    01/30/23 2355  BNP 77.5    ProBNP (last 3 results) No  results for input(s): PROBNP in the last 8760 hours.  CBG: Recent Labs  Lab 08/15/23 1207 08/15/23 1703  GLUCAP 173* 120*       Signed:  Devaughn KATHEE Ban MD.  [facility] 08/16/2023, 12:15 PM

## 2023-08-16 NOTE — Consult Note (Signed)
 ORTHOPEDIC CONSULTATION  Carl [name] [id number]  08/16/2023  CC:  Chief Complaint  Patient presents with   Leg Pain    History of Present IlIness: The patient is a 73 y.o. male who was admitted on 08/13/2023 for bilateral lower extremity peripheral vascular disease and cellulitis.  The patient reports he fell on Christmas Eve injuring his right knee region.  The patient is on Xarelto .  He has a known history of significant severe peripheral vascular disease involving both lower extremities.  The patient was admitted for cellulitis and treated with appropriate antibiotics.  Orthopedics was consulted for possible irrigation and debridement.  The patient reports that since admission and treatment with antibiotics he feels much better and has no significant discomfort in his lower extremities.  He reports he has been up ambulating in the hallway with physical therapy.  08/13/2023 admission history for completeness: Carl [name] is a 73 y.o. Caucasian male with medical history significant for asthma, atrial fibrillation, GERD, hypertension, hyponatremia, diverticulosis and psoriasis, who presented to the emergency room with acute onset of worsening right leg swelling with erythema, warmth, and tenderness.  He had a fall on 12/24.  He was seen in urgent care on 12/30 who thought that infrapatellar bursitis for which she was given doxycycline.  Today due to persistent symptoms he was seen again in urgent care and referred to the ER for further management of suspected cellulitis.  He denied any chest pain or palpitations.  No cough or wheezing or hemoptysis.  No fever or chills.  No dysuria, oliguria or hematuria or flank pain.   PMH:  Past Medical History:  Diagnosis Date   Anemia    Asperger's syndrome    Asthma    Atrial fibrillation (HCC)    Diverticulosis    GERD (gastroesophageal reflux disease)    History of MRSA infection    Hypertension    Hyponatremia    Psoriasis     Valvular heart disease     SH:  Past Surgical History:  Procedure Laterality Date   CARDIOVERSION N/A 02/27/2022   Procedure: CARDIOVERSION;  Surgeon: Hester Wolm PARAS, MD;  Location: [facility] ORS;  Service: Cardiovascular;  Laterality: N/A;   COLONOSCOPY     COLONOSCOPY WITH PROPOFOL  N/A 04/23/2018   Procedure: COLONOSCOPY WITH PROPOFOL ;  Surgeon: Gaylyn Gladis PENNER, MD;  Location: [facility] ENDOSCOPY;  Service: Endoscopy;  Laterality: N/A;    ALL: No Known Allergies  MED:  Medications Prior to Admission  Medication Sig Dispense Refill Last Dose/Taking   alum & mag hydroxide-simeth (MAALOX/MYLANTA) 200-200-20 MG/5ML suspension Take 30 mLs by mouth every 6 (six) hours as needed for indigestion or heartburn. 355 mL 0 Taking As Needed   amLODipine  (NORVASC ) 5 MG tablet Take 5 mg by mouth daily.   08/13/2023   ascorbic acid  (VITAMIN C ) 1000 MG tablet Take 1,000 mg by mouth daily.   08/13/2023   atenolol  (TENORMIN ) 25 MG tablet Take 25 mg by mouth 2 (two) times daily.   08/13/2023   B Complex Vitamins (B COMPLEX PO) Take 1 tablet by mouth daily.   08/13/2023   bisacodyl  (DULCOLAX) 5 MG EC tablet Take 1 tablet (5 mg total) by mouth daily as needed for moderate constipation. 30 tablet 0 Taking As Needed   cetirizine (ZYRTEC) 10 MG tablet Take 10 mg by mouth daily.   08/13/2023   flecainide  (TAMBOCOR ) 100 MG tablet Take 100 mg by mouth 2 (two) times daily.   08/13/2023   folic acid  (FOLVITE )  1 MG tablet Take 1 mg by mouth daily.   08/13/2023   furosemide  (LASIX ) 20 MG tablet Take 1 tablet (20 mg total) by mouth 2 (two) times daily.   08/13/2023   Garlic  100 MG TABS Take 100 mg by mouth daily.   08/13/2023   lisinopril  (PRINIVIL ,ZESTRIL ) 10 MG tablet Take 10 mg by mouth daily.   08/13/2023   montelukast  (SINGULAIR ) 10 MG tablet Take 10 mg by mouth at bedtime.   Taking   Multiple Vitamin (MULTIVITAMIN) tablet Take 1 tablet by mouth daily.   08/13/2023   Omega-3 Fatty Acids (SALMON OIL PO) Take 1 capsule by mouth daily.    08/13/2023   pantoprazole  (PROTONIX ) 40 MG tablet Take 40 mg by mouth daily.   08/13/2023   polyethylene glycol (MIRALAX  / GLYCOLAX ) 17 g packet Take 17 g by mouth daily. 14 each 0 Taking   rivaroxaban  (XARELTO ) 20 MG TABS tablet Take 20 mg by mouth daily with supper.   08/12/2023   senna-docusate (SENOKOT-S) 8.6-50 MG tablet Take 1 tablet by mouth daily. 30 tablet 0 Taking   Turmeric (QC TUMERIC COMPLEX PO) Take 1 tablet by mouth daily.   08/13/2023   Zinc  Citrate-Phytase 25-500 MG CAPS Take 1 capsule by mouth daily.   08/13/2023   albuterol  (VENTOLIN  HFA) 108 (90 Base) MCG/ACT inhaler Inhale 2 puffs into the lungs every 6 (six) hours as needed for wheezing. (Patient not taking: Reported on 08/14/2023)   Not Taking   hyoscyamine  (LEVBID ) 0.375 MG 12 hr tablet Take 1 tablet by mouth in the morning and at bedtime. (Patient not taking: Reported on 02/04/2023)      sodium chloride  1 g tablet Take 1 tablet (1 g total) by mouth 3 (three) times daily with meals. (Patient not taking: Reported on 08/14/2023) 30 tablet 0 Not Taking   urea  (URE-NA) 15 g PACK oral packet Take 15 g by mouth 2 (two) times daily. (Patient not taking: Reported on 02/04/2023) 60 packet 1     All home medications have been reviewed as documented in the medication reconciliation portion of the patient record.  FH: History reviewed. No pertinent family history.  Social:  reports that he has never smoked. He has never used smokeless tobacco. He reports that he does not currently use alcohol. He reports that he does not use drugs.  Review of Systems: General: Denies fever, chills, weight loss Eyes: Denies blurry vision, changes in vision ENT: Denies sore throat, congestions, nosebleeds CV: Denies chest pain, palpitations Respiratory: Denies shortness of breath, wheezing, cough Gl: Denies abdominal pain, nausea, vomiting GU: Denies hematuria Integumentary: Denies rashes or lesions Neuro: Denies headache, dizziness Psych: Negative Hem/Onc:  Denies easy bruising or bleeding disorders Musculoskeletal: See HPI above.  Vitals: BP 119/70 (BP Location: Right Arm)   Pulse (!) 59   Temp 98 F (36.7 C) (Oral)   Resp 16   Ht 6' 3.98 (1.93 m)   Wt 127 kg   SpO2 97%   BMI 34.10 kg/m    Physical Exam: General: Awake, alert and oriented, no acute distress. Eyes: Pupils reactive, EOMI, normal conjunctiva, no scleral icterus. HENT: Normocephalic, atraumatic, normal hearing, moist oral mucosa Neck: Supple, non-tender, no cervical lymphadenopathy. Lungs: Chest rise is symmetric, non-labored respiration, chest wall nontender to palpation Heart: Normal rate by palpation, normal peripheral perfusion Abdomen: Soft, non-tender, non-distended. Pelvis is stable. Skin: Skin envelope intact, dry and pink, no rashes or lesions, no signs of infection. Neurologic: Awake, alert, and oriented X3 Psychiatric:  Cooperative, appropriate mood and affect.  Musculoskeletal: Evaluation of the patient's bilateral lower extremities reveals significant chronic skin changes of chronic vascular disease.  The patient has a very small abrasion over his right patellar region with no purulent discharge.  There is a small area of swelling around the prepatellar bursa but it is not significantly tender.  There is no effusion around the right knee joint.  Range of motion of the right knee joint is full and pain-free as is the left knee.  The patient is able to move his ankles and toes well on command.  He denies dysesthesias but has some decreased sensation to sharp dull over all dermatomal patterns around his feet and ankles.  The calves reveal no palpable cords or areas of tenderness.  Capillary refill time is very sluggish at approximately 2.5 seconds distally in both lower extremities.  I cannot palpate a dorsalis pedis or posterior tibial pulse.  Radiographic findings: The patient underwent a Doppler study on both lower extremities and admission to rule out DVT.  The  scan was negative for DVT.  No subcutaneous areas of fluid collection or deep abscessation was noted on the ultrasound scan.   Labs:  Recent Labs    08/13/23 2110 08/14/23 0510 08/15/23 0348 08/16/23 0541  HGB 11.5* 10.4* 10.7* 11.0*   Recent Labs    08/15/23 0348 08/16/23 0541  WBC 5.4 5.7  RBC 3.97* 4.10*  HCT 32.2* 32.9*  PLT 217 238   Recent Labs    08/15/23 0348 08/16/23 0541  NA 128* 129*  K 4.2 4.4  CL 95* 98  CO2 25 22  BUN 14 22  CREATININE 0.67 0.65  GLUCOSE 104* 102*  CALCIUM 8.6* 8.5*   No results for input(s): LABPT, INR in the last 72 hours.   Assessment/Plan:    Assessment: 73 year old male on blood thinners with small traumatic right prepatellar bursitis.  Significant peripheral vascular disease.  Admitted for cellulitis and treated with IV antibiotics and appears to be showing great clinical improvement.   Plan:  I discussed with the patient that I highly recommend the patient follow-up with a vascular surgeon locally for his significant peripheral vascular disease this been managed by his primary care provider at the [facility] clinic.  I discussed with the patient and his admitting physician that I see no areas of fluctuance that would be concerning for an abscess at this stage in need of irrigation and debridement in the operating room.  I do recommend the patient be discharged on appropriate oral antibiotics to continue his treatment for his peripheral vascular disease and cellulitis.  His discharge was planned for today and I believe that can be carried out.  Cordella Lamar Knack M.D. 08/16/2023 12:49 PM

## 2023-08-16 NOTE — Plan of Care (Signed)

## 2023-08-18 LAB — CULTURE, BLOOD (SINGLE): Culture: NO GROWTH

## 2023-09-29 ENCOUNTER — Encounter (INDEPENDENT_AMBULATORY_CARE_PROVIDER_SITE_OTHER): Payer: Medicare Other | Admitting: Nurse Practitioner

## 2023-11-05 ENCOUNTER — Other Ambulatory Visit: Payer: Self-pay

## 2023-11-05 ENCOUNTER — Encounter: Payer: Self-pay | Admitting: *Deleted

## 2023-11-05 ENCOUNTER — Emergency Department
Admission: EM | Admit: 2023-11-05 | Discharge: 2023-11-05 | Disposition: A | Discharge status: Home / Self Care | Attending: Emergency Medicine | Admitting: Emergency Medicine

## 2023-11-05 ENCOUNTER — Emergency Department

## 2023-11-05 DIAGNOSIS — G9608 Other cranial cerebrospinal fluid leak: Secondary | ICD-10-CM | POA: Diagnosis not present

## 2023-11-05 DIAGNOSIS — S29002A Unspecified injury of muscle and tendon of back wall of thorax, initial encounter: Secondary | ICD-10-CM | POA: Diagnosis present

## 2023-11-05 DIAGNOSIS — S0990XA Unspecified injury of head, initial encounter: Secondary | ICD-10-CM | POA: Insufficient documentation

## 2023-11-05 DIAGNOSIS — W19XXXA Unspecified fall, initial encounter: Secondary | ICD-10-CM

## 2023-11-05 DIAGNOSIS — W01198A Fall on same level from slipping, tripping and stumbling with subsequent striking against other object, initial encounter: Secondary | ICD-10-CM | POA: Diagnosis not present

## 2023-11-05 DIAGNOSIS — S22089A Unspecified fracture of T11-T12 vertebra, initial encounter for closed fracture: Secondary | ICD-10-CM | POA: Diagnosis not present

## 2023-11-05 DIAGNOSIS — I10 Essential (primary) hypertension: Secondary | ICD-10-CM | POA: Insufficient documentation

## 2023-11-05 DIAGNOSIS — K429 Umbilical hernia without obstruction or gangrene: Secondary | ICD-10-CM | POA: Diagnosis not present

## 2023-11-05 LAB — COMPREHENSIVE METABOLIC PANEL WITH GFR
ALT: 25 U/L (ref 0–44)
AST: 26 U/L (ref 15–41)
Albumin: 4.3 g/dL (ref 3.5–5.0)
Alkaline Phosphatase: 70 U/L (ref 38–126)
Anion gap: 11 (ref 5–15)
BUN: 22 mg/dL (ref 8–23)
CO2: 25 mmol/L (ref 22–32)
Calcium: 9.2 mg/dL (ref 8.9–10.3)
Chloride: 90 mmol/L — ABNORMAL LOW (ref 98–111)
Creatinine, Ser: 0.74 mg/dL (ref 0.61–1.24)
GFR, Estimated: >60 mL/min (ref >60)
Glucose, Bld: 103 mg/dL — ABNORMAL HIGH (ref 70–99)
Potassium: 4.4 mmol/L (ref 3.5–5.1)
Sodium: 126 mmol/L — ABNORMAL LOW (ref 135–145)
Total Bilirubin: 1.4 mg/dL — ABNORMAL HIGH (ref 0.0–1.2)
Total Protein: 7.4 g/dL (ref 6.5–8.1)

## 2023-11-05 LAB — CBC WITH DIFFERENTIAL/PLATELET
Abs Immature Granulocytes: 0.02 10*3/uL (ref 0.00–0.07)
Basophils Absolute: 0 10*3/uL (ref 0.0–0.1)
Basophils Relative: 1 %
Eosinophils Absolute: 0.3 10*3/uL (ref 0.0–0.5)
Eosinophils Relative: 5 %
HCT: 37.7 % — ABNORMAL LOW (ref 39.0–52.0)
Hemoglobin: 12.8 g/dL — ABNORMAL LOW (ref 13.0–17.0)
Immature Granulocytes: 0 %
Lymphocytes Relative: 10 %
Lymphs Abs: 0.6 10*3/uL — ABNORMAL LOW (ref 0.7–4.0)
MCH: 26.9 pg (ref 26.0–34.0)
MCHC: 34 g/dL (ref 30.0–36.0)
MCV: 79.4 fL — ABNORMAL LOW (ref 80.0–100.0)
Monocytes Absolute: 0.7 10*3/uL (ref 0.1–1.0)
Monocytes Relative: 12 %
Neutro Abs: 4 10*3/uL (ref 1.7–7.7)
Neutrophils Relative %: 72 %
Platelets: 191 10*3/uL (ref 150–400)
RBC: 4.75 MIL/uL (ref 4.22–5.81)
RDW: 15.3 % (ref 11.5–15.5)
WBC: 5.5 10*3/uL (ref 4.0–10.5)
nRBC: 0 % (ref 0.0–0.2)

## 2023-11-05 LAB — TROPONIN I (HIGH SENSITIVITY)
Troponin I (High Sensitivity): 3 ng/L (ref <18)
Troponin I (High Sensitivity): 3 ng/L (ref <18)

## 2023-11-05 MED ORDER — LIDOCAINE 5 % EX PTCH
1.0000 | MEDICATED_PATCH | Freq: Once | CUTANEOUS | Status: DC
  Administered 2023-11-05: 1 via TRANSDERMAL
  Filled 2023-11-05: qty 1

## 2023-11-05 MED ORDER — CYCLOBENZAPRINE HCL 5 MG PO TABS
5.0000 mg | ORAL_TABLET | Freq: Three times a day (TID) | ORAL | 0 refills | Status: AC | PRN
Start: 2023-11-05 — End: ?

## 2023-11-05 MED ORDER — KETOROLAC TROMETHAMINE 15 MG/ML IJ SOLN
15.0000 mg | Freq: Once | INTRAMUSCULAR | Status: AC
  Administered 2023-11-05: 15 mg via INTRAVENOUS
  Filled 2023-11-05: qty 1

## 2023-11-05 MED ORDER — HYDROMORPHONE HCL 1 MG/ML IJ SOLN
0.5000 mg | Freq: Once | INTRAMUSCULAR | Status: AC
  Administered 2023-11-05: 0.5 mg via INTRAVENOUS
  Filled 2023-11-05: qty 0.5

## 2023-11-05 MED ORDER — LIDOCAINE 5 % EX PTCH
1.0000 | MEDICATED_PATCH | CUTANEOUS | Status: DC
  Administered 2023-11-05: 1 via TRANSDERMAL
  Filled 2023-11-05: qty 1

## 2023-11-05 MED ORDER — IOHEXOL 300 MG/ML  SOLN
100.0000 mL | Freq: Once | INTRAMUSCULAR | Status: AC | PRN
  Administered 2023-11-05: 100 mL via INTRAVENOUS

## 2023-11-05 MED ORDER — HYDROCODONE-ACETAMINOPHEN 5-325 MG PO TABS: 1.0000 | ORAL_TABLET | Freq: Four times a day (QID) | ORAL | 0 refills | Status: AC | PRN

## 2023-11-05 NOTE — ED Notes (Signed)
Ortho called for TLSO brace 

## 2023-11-05 NOTE — ED Notes (Signed)
 Back brace applied to back without diff.

## 2023-11-05 NOTE — ED Triage Notes (Addendum)
 Pt fell when getting off curb and when he did he fell back and landed on his back.  Pt reports pain in his back.  Pt is on xarelto.  Did not his his head, no LOC. Pt was wearing coat and sweater, red mark noted on mid back.

## 2023-11-05 NOTE — Discharge Instructions (Signed)
 You were seen in the ER after your fall.  Your testing did show a small fracture in your back.  You can use Tylenol and ibuprofen to help with your pain.  I have also sent a short course of narcotic pain medication to your pharmacy to help with your pain.  I have also sent a prescription for muscle relaxer for any muscle spasms.  Wear the that you were given as you are able to help with your pain.  I have included information for our spine specialist for follow-up on your fracture.  Return to the ER for new or worsening symptoms.

## 2023-11-05 NOTE — ED Notes (Signed)
 Pt ambulated per request of provider without incident. Pt able to independently ambulate.

## 2023-11-05 NOTE — ED Notes (Signed)
 Called Ortho/ ordered TLSO brace

## 2023-11-05 NOTE — ED Provider Notes (Signed)
 [facility] Provider Note    Event Date/Time   First MD Initiated Contact with Patient 11/05/23 1517     (approximate)   History   Fall   HPI  Carl [name] is a 73 year old male with history of A-fib, HTN, psychogenic polydipsia presenting to the emergency department for evaluation after a fall.  Patient was walking to get into a van when he slipped on a curb and fell backwards and hit his back.  Does not think he hit his head.  No LOC.  Bystanders helped him up and into a wheelchair.  Has not tried to walk since that time.  Currently reports spasming in his back.  Denies numbness, tingling, focal weakness.  I reviewed his admission from January of this year.  At that time, patient presented with suspected cellulitis superimposed on chronic venous stasis changes treated with antibiotics and wound care.  During that admission, he was noted to have hyponatremia with a history of this that was felt to likely be from psychogenic polydipsia for which IV fluids were not recommended and he was kept on fluid restriction with plans to continue his home Lasix and home sodium.      Physical Exam   Triage Vital Signs: ED Triage Vitals  Encounter Vitals Group     BP 11/05/23 1326 132/75     Systolic BP Percentile --      Diastolic BP Percentile --      Pulse Rate 11/05/23 1326 (!) 55     Resp 11/05/23 1326 18     Temp 11/05/23 1326 97.6 F (36.4 C)     Temp Source 11/05/23 1326 Oral     SpO2 11/05/23 1326 98 %     Weight --      Height --      Head Circumference --      Peak Flow --      Pain Score 11/05/23 1327 6     Pain Loc --      Pain Education --      Exclude from Growth Chart --     Most recent vital signs: Vitals:   11/05/23 1654 11/05/23 1829  BP: (!) 158/74 136/73  Pulse: 92 80  Resp: 18 18  Temp:  97.8 F (36.6 C)  SpO2: 100% 100%    Nursing notes and vital signs reviewed.  General: Adult male, laying in bed, awake  interactive Head: Atraumatic Back: Tenderness throughout the back without focal area of point tenderness Chest: Symmetric chest rise, no tenderness to palpation.  Cardiac: Regular rhythm and rate.  Respiratory: Lungs clear to auscultation Abdomen: Soft, nondistended. No tenderness to palpation.  Pelvis: Stable in AP and lateral compression. No tenderness to palpation. MSK: No deformity to bilateral upper and lower extremity. Full range of motion to bilateral upper lower extremity.  Neuro: Alert, oriented. GCS 15. 5 out of 5 strength in bilateral upper and lower extremities. Normal sensation to light touch in bilateral upper and lower extremity. Skin: No evidence of burns or lacerations.  ED Results / Procedures / Treatments   Labs (all labs ordered are listed, but only abnormal results are displayed) Labs Reviewed  CBC WITH DIFFERENTIAL/PLATELET - Abnormal; Notable for the following components:      Result Value   Hemoglobin 12.8 (*)    HCT 37.7 (*)    MCV 79.4 (*)    Lymphs Abs 0.6 (*)    All other components within normal limits  COMPREHENSIVE METABOLIC  PANEL WITH GFR - Abnormal; Notable for the following components:   Sodium 126 (*)    Chloride 90 (*)    Glucose, Bld 103 (*)    Total Bilirubin 1.4 (*)    All other components within normal limits  TROPONIN I (HIGH SENSITIVITY)  TROPONIN I (HIGH SENSITIVITY)     EKG EKG independently reviewed interpreted by myself (ER attending) demonstrates:    RADIOLOGY Imaging independently reviewed and interpreted by myself demonstrates:  CT head without acute bleed CT C-spine without acute fracture CT chest abdomen pelvis without traumatic injury CT L and T-spine demonstrates a fracture of the T11 level without associated compression  PROCEDURES:  Critical Care performed: No  Procedures   MEDICATIONS ORDERED IN ED: Medications  lidocaine (LIDODERM) 5 % 1 patch (1 patch Transdermal Patch Applied 11/05/23 1620)  lidocaine  (LIDODERM) 5 % 1 patch (1 patch Transdermal Patch Applied 11/05/23 1826)  iohexol (OMNIPAQUE) 300 MG/ML solution 100 mL (100 mLs Intravenous Contrast Given 11/05/23 1518)  HYDROmorphone (DILAUDID) injection 0.5 mg (0.5 mg Intravenous Given 11/05/23 1614)  ketorolac (TORADOL) 15 MG/ML injection 15 mg (15 mg Intravenous Given 11/05/23 1617)     IMPRESSION / MDM / ASSESSMENT AND PLAN / ED COURSE  I reviewed the triage vital signs and the nursing notes.  Differential diagnosis includes, but is not limited to, intracranial bleed, skull fracture, spine fracture, rib fracture, pneumothorax, intra-abdominal injury  Patient's presentation is most consistent with acute presentation with potential threat to life or bodily function.  73 year old male presenting after a fall.  Patient seen in triage and CT trauma pack ordered.  Labs with stable hyponatremia and anemia.  Ordered for multimodal pain control.  Disposition pending CTs.  6:40 PM CTs notable for a T11 fracture without height loss.  This was reviewed with Dr. Katrinka Blazing with neurosurgery.  He recommended TLSO brace for comfort if patient is able to tolerate it and outpatient follow-up.  Discussed with patient who is comfortable this plan.  Will DC with multimodal pain control including pain medication, muscle relaxers.  Strict return precautions provided.  Patient discharged in stable condition.    FINAL CLINICAL IMPRESSION(S) / ED DIAGNOSES   Final diagnoses:  Closed fracture of eleventh thoracic vertebra, unspecified fracture morphology, initial encounter (HCC)  Fall, initial encounter     Rx / DC Orders   ED Discharge Orders     None        Note:  This document was prepared using Dragon voice recognition software and may include unintentional dictation errors.   Trinna Post, MD 11/05/23 [phone]

## 2023-11-05 NOTE — ED Provider Notes (Signed)
 HPI: Pt is a 73 y.o. male who presents with complaints of @CCN2 @.  The patient p/w  back pain on xarelto after fall.   ROS: Denies fever, chest pain, vomiting  Past Medical History:  Diagnosis Date   Anemia    Asperger's syndrome    Asthma    Atrial fibrillation (HCC)    Diverticulosis    GERD (gastroesophageal reflux disease)    History of MRSA infection    Hypertension    Hyponatremia    Psoriasis    Valvular heart disease    Vitals:   11/05/23 1326  BP: 132/75  Pulse: (!) 55  Resp: 18  Temp: 97.6 F (36.4 C)  SpO2: 98%    Focused Physical Exam: Gen: No acute distress Head: atraumatic, normocephalic Eyes: Extraocular movements grossly intact; conjunctiva clear CV: RRR Lung: No increased WOB, no stridor GI: ND, no obvious masses Neuro: Alert and awake Low C spine, T Spine L spine, with abrasions noted to the back   Medical Decision Making and Plan: Given the patient's initial medical screening exam, the following diagnostic evaluation has been ordered. The patient will be placed in the appropriate treatment space, once one is available, to complete the evaluation and treatment. I have discussed the plan of care with the patient and I have advised the patient that an ED physician or mid-level practitioner will reevaluate their condition after the test results have been received, as the results may give them additional insight into the type of treatment they may need.   Diagnostics: CT  Treatments: none immediately   Concha Se, MD 11/05/23 1345

## 2023-11-05 NOTE — Progress Notes (Signed)
 Orthopedic Tech Progress Note Patient Details:  Carl [name] [birth year] [id number] Called in order to [facility] for a TLSO BRACE   Patient ID: Carl [name], male   DOB: 1951-05-16, 73 y.o.   MRN: [id number]  [name] 11/05/2023, 7:27 PM

## 2023-11-09 ENCOUNTER — Emergency Department
Admission: EM | Admit: 2023-11-09 | Discharge: 2023-11-09 | Disposition: A | Discharge status: Home / Self Care | Attending: Emergency Medicine | Admitting: Emergency Medicine

## 2023-11-09 ENCOUNTER — Other Ambulatory Visit: Payer: Self-pay

## 2023-11-09 DIAGNOSIS — I10 Essential (primary) hypertension: Secondary | ICD-10-CM | POA: Insufficient documentation

## 2023-11-09 DIAGNOSIS — T148XXA Other injury of unspecified body region, initial encounter: Secondary | ICD-10-CM

## 2023-11-09 DIAGNOSIS — S7011XA Contusion of right thigh, initial encounter: Secondary | ICD-10-CM | POA: Insufficient documentation

## 2023-11-09 DIAGNOSIS — W19XXXA Unspecified fall, initial encounter: Secondary | ICD-10-CM | POA: Insufficient documentation

## 2023-11-09 DIAGNOSIS — Z7901 Long term (current) use of anticoagulants: Secondary | ICD-10-CM | POA: Insufficient documentation

## 2023-11-09 DIAGNOSIS — M79651 Pain in right thigh: Secondary | ICD-10-CM | POA: Diagnosis present

## 2023-11-09 DIAGNOSIS — S7012XA Contusion of left thigh, initial encounter: Secondary | ICD-10-CM | POA: Insufficient documentation

## 2023-11-09 LAB — CBC
HCT: 31 % — ABNORMAL LOW (ref 39.0–52.0)
Hemoglobin: 10.5 g/dL — ABNORMAL LOW (ref 13.0–17.0)
MCH: 26.8 pg (ref 26.0–34.0)
MCHC: 33.9 g/dL (ref 30.0–36.0)
MCV: 79.1 fL — ABNORMAL LOW (ref 80.0–100.0)
Platelets: 181 10*3/uL (ref 150–400)
RBC: 3.92 MIL/uL — ABNORMAL LOW (ref 4.22–5.81)
RDW: 15.7 % — ABNORMAL HIGH (ref 11.5–15.5)
WBC: 5.8 10*3/uL (ref 4.0–10.5)
nRBC: 0 % (ref 0.0–0.2)

## 2023-11-09 NOTE — ED Triage Notes (Addendum)
 Pt to ED via POV from home. Pt reports had a fall last Thursday and fell backwards. Pt reports continued pain to right buttock and increased swelling. Pt is on blood thinner.  Pt denies any new injuries. Pt has been able to ambulate. Pt HR in triage 48-140s. Pt denies CP or SOB. Pt with hx of afib   EKG reading afib HR controlled in the 60s

## 2023-11-09 NOTE — ED Notes (Signed)
 See triage note  Presents with pain to right lower back/hip area  States he fell last Thursday  Conts to have pain  and swelling   Ambulates to treatment room

## 2023-11-09 NOTE — ED Provider Notes (Signed)
 [facility] Provider Note    Event Date/Time   First MD Initiated Contact with Patient 11/09/23 1415     (approximate)  History   Chief Complaint: Fall  HPI  Carl [name] is a 73 y.o. male with a past medical history of anemia, atrial fibrillation on Xarelto, hypertension, presents to the emergency department for pain to his right buttocks.  According to the patient he had a fall on Thursday (4 days ago), he states since that time he has noticed some pain to his right buttocks where he has noted an area of swelling.  Patient has been ambulatory without issue per patient.  Physical Exam   Triage Vital Signs: ED Triage Vitals  Encounter Vitals Group     BP 11/09/23 1247 110/61     Systolic BP Percentile --      Diastolic BP Percentile --      Pulse Rate 11/09/23 1247 (!) 145     Resp 11/09/23 1247 20     Temp 11/09/23 1247 98 F (36.7 C)     Temp Source 11/09/23 1247 Oral     SpO2 11/09/23 1247 100 %     Weight 11/09/23 1410 279 lb 15.8 oz (127 kg)     Height 11/09/23 1410 6\' 4"  (1.93 m)     Head Circumference --      Peak Flow --      Pain Score --      Pain Loc --      Pain Education --      Exclude from Growth Chart --     Most recent vital signs: Vitals:   11/09/23 1247 11/09/23 1255  BP: 110/61   Pulse: (!) 145 60  Resp: 20   Temp: 98 F (36.7 C)   SpO2: 100%     General: Awake, no distress.  Abd:  No distention.   Other:  Patient has older appearing ecchymosis to the right buttocks with mild swelling consistent with a localized hematoma approximately 10 cm in diameter.   ED Results / Procedures / Treatments   MEDICATIONS ORDERED IN ED: Medications - No data to display   IMPRESSION / MDM / ASSESSMENT AND PLAN / ED COURSE  I reviewed the triage vital signs and the nursing notes.  Patient's presentation is most consistent with acute illness / injury with system symptoms.  Patient presents the emergency department  after falling onto his buttocks 4 days ago with some right buttock pain and swelling where he has a local hematoma proximally 10 cm in diameter so with some older appearing ecchymosis.  Patient was seen here on Thursday following the fall where the patient had CT scan showing no traumatic findings including that of the pelvis.  Patient had lab work performed that day as well showing no significant finding hemoglobin of 12.8.  Will repeat a CBC today to ensure no significant drop in hemoglobin.  However this appears to be fairly small hematoma.  Patient ambulatory in the room without issue.  As long as the H&H has not dropped significantly I believe the patient is safe for discharge home with outpatient follow-up with his PCP.  Patient's hemoglobin is 10.5 today.  This is within patient's normal hemoglobin range however it is decreased from several days ago.  However as the area of hematoma appears to be well localized with no induration or fullness extending distally down the leg I suspect the patient had a moderate hematoma but I believe the  patient is safe for discharge home with outpatient follow-up to have a repeat H&H drawn Wednesday or Thursday of this week.  FINAL CLINICAL IMPRESSION(S) / ED DIAGNOSES   Hematoma   Note:  This document was prepared using Dragon voice recognition software and may include unintentional dictation errors.   Minna Antis, MD 11/09/23 [phone]

## 2023-11-09 NOTE — Discharge Instructions (Signed)
 Please follow-up with your doctor to have your lab work rechecked on Wednesday or Thursday of this week to ensure that your blood counts do not drop.  Return to the emergency department for any symptom concerning to yourself.

## 2023-11-09 NOTE — Progress Notes (Deleted)
 Referring Physician:  Concha Se, MD [address] [place],  [place] [address]  Primary Physician:  Gracelyn Nurse, MD  History of Present Illness: 11/09/2023 [name] is here today with a chief complaint of ***  T11 fracture post fall on 11/05/2023. He was given a TLSO to wear for comfort.     Duration: *** Location: *** Quality: *** Severity: ***  Precipitating: aggravated by *** Modifying factors: made better by *** Weakness: none Timing: *** Bowel/Bladder Dysfunction: none   Past Surgery: ***none  Joellyn Haff has ***no symptoms of cervical myelopathy.  The symptoms are causing a significant impact on the patient's life.   Review of Systems:  A 10 point review of systems is negative, except for the pertinent positives and negatives detailed in the HPI.  Past Medical History: Past Medical History:  Diagnosis Date   Anemia    Asperger's syndrome    Asthma    Atrial fibrillation (HCC)    Diverticulosis    GERD (gastroesophageal reflux disease)    History of MRSA infection    Hypertension    Hyponatremia    Psoriasis    Valvular heart disease     Past Surgical History: Past Surgical History:  Procedure Laterality Date   CARDIOVERSION N/A 02/27/2022   Procedure: CARDIOVERSION;  Surgeon: Lamar Blinks, MD;  Location: [facility] ORS;  Service: Cardiovascular;  Laterality: N/A;   COLONOSCOPY     COLONOSCOPY WITH PROPOFOL N/A 04/23/2018   Procedure: COLONOSCOPY WITH PROPOFOL;  Surgeon: Christena Deem, MD;  Location: [facility] ENDOSCOPY;  Service: Endoscopy;  Laterality: N/A;    Allergies: Allergies as of 11/11/2023   (No Known Allergies)    Medications: Outpatient Encounter Medications as of 11/11/2023  Medication Sig   albuterol (VENTOLIN HFA) 108 (90 Base) MCG/ACT inhaler Inhale 2 puffs into the lungs every 6 (six) hours as needed for wheezing. (Patient not taking: Reported on 08/14/2023)   alum & mag hydroxide-simeth  (MAALOX/MYLANTA) 200-200-20 MG/5ML suspension Take 30 mLs by mouth every 6 (six) hours as needed for indigestion or heartburn.   amLODipine (NORVASC) 5 MG tablet Take 5 mg by mouth daily.   ascorbic acid (VITAMIN C) 1000 MG tablet Take 1,000 mg by mouth daily.   atenolol (TENORMIN) 25 MG tablet Take 25 mg by mouth 2 (two) times daily.   B Complex Vitamins (B COMPLEX PO) Take 1 tablet by mouth daily.   bisacodyl (DULCOLAX) 5 MG EC tablet Take 1 tablet (5 mg total) by mouth daily as needed for moderate constipation.   cetirizine (ZYRTEC) 10 MG tablet Take 10 mg by mouth daily.   cyclobenzaprine (FLEXERIL) 5 MG tablet Take 1 tablet (5 mg total) by mouth 3 (three) times daily as needed for muscle spasms.   flecainide (TAMBOCOR) 100 MG tablet Take 100 mg by mouth 2 (two) times daily.   folic acid (FOLVITE) 1 MG tablet Take 1 mg by mouth daily.   furosemide (LASIX) 20 MG tablet Take 1 tablet (20 mg total) by mouth 2 (two) times daily.   Garlic 100 MG TABS Take 100 mg by mouth daily.   HYDROcodone-acetaminophen (NORCO/VICODIN) 5-325 MG tablet Take 1 tablet by mouth every 6 (six) hours as needed for up to 5 days.   hyoscyamine (LEVBID) 0.375 MG 12 hr tablet Take 1 tablet by mouth in the morning and at bedtime. (Patient not taking: Reported on 02/04/2023)   lisinopril (PRINIVIL,ZESTRIL) 10 MG tablet Take 10 mg by mouth daily.  montelukast (SINGULAIR) 10 MG tablet Take 10 mg by mouth at bedtime.   Multiple Vitamin (MULTIVITAMIN) tablet Take 1 tablet by mouth daily.   Omega-3 Fatty Acids (SALMON OIL PO) Take 1 capsule by mouth daily.   pantoprazole (PROTONIX) 40 MG tablet Take 40 mg by mouth daily.   polyethylene glycol (MIRALAX / GLYCOLAX) 17 g packet Take 17 g by mouth daily.   rivaroxaban (XARELTO) 20 MG TABS tablet Take 20 mg by mouth daily with supper.   senna-docusate (SENOKOT-S) 8.6-50 MG tablet Take 1 tablet by mouth daily.   sodium chloride 1 g tablet Take 1 tablet (1 g total) by mouth 3 (three)  times daily with meals. (Patient not taking: Reported on 08/14/2023)   Turmeric (QC TUMERIC COMPLEX PO) Take 1 tablet by mouth daily.   urea (URE-NA) 15 g PACK oral packet Take 15 g by mouth 2 (two) times daily. (Patient not taking: Reported on 02/04/2023)   Zinc Citrate-Phytase 25-500 MG CAPS Take 1 capsule by mouth daily.   No facility-administered encounter medications on file as of 11/11/2023.    Social History: Social History   Tobacco Use   Smoking status: Never   Smokeless tobacco: Never  Vaping Use   Vaping status: Never Used  Substance Use Topics   Alcohol use: Not Currently    Comment: rarely   Drug use: Never    Family Medical History: No family history on file.  Physical Examination: @VITALWITHPAIN @  General: Patient is well developed, well nourished, calm, collected, and in no apparent distress. Attention to examination is appropriate.  Psychiatric: Patient is non-anxious.  Head:  Pupils equal, round, and reactive to light.  ENT:  Oral mucosa appears well hydrated.  Neck:   Supple.  ***Full range of motion.  Respiratory: Patient is breathing without any difficulty.  Extremities: No edema.  Vascular: Palpable dorsal pedal pulses.  Skin:   On exposed skin, there are no abnormal skin lesions.  NEUROLOGICAL:     Awake, alert, oriented to person, place, and time.  Speech is clear and fluent. Fund of knowledge is appropriate.   Cranial Nerves: Pupils equal round and reactive to light.  Facial tone is symmetric.  Facial sensation is symmetric.  ROM of spine: ***full.  Palpation of spine: ***non tender.    Strength: Side Biceps Triceps Deltoid Interossei Grip Wrist Ext. Wrist Flex.  R 5 5 5 5 5 5 5   L 5 5 5 5 5 5 5    Side Iliopsoas Quads Hamstring PF DF EHL  R 5 5 5 5 5 5   L 5 5 5 5 5 5    Reflexes are ***2+ and symmetric at the biceps, triceps, brachioradialis, patella and achilles.   Hoffman's is absent.  Clonus is not present.  Toes are down-going.   Bilateral upper and lower extremity sensation is intact to light touch.    Gait is normal.   No difficulty with tandem gait.   No evidence of dysmetria noted.  Medical Decision Making  Imaging: ***  I have personally reviewed the images and agree with the above interpretation.  Assessment and Plan: Mr. Monje is a pleasant 73 y.o. male with ***    Thank you for involving me in the care of this patient.   I spent a total of *** minutes in both face-to-face and non-face-to-face activities for this visit on the date of this encounter.   Joan Flores, PA-C Dept. of Neurosurgery

## 2023-11-11 ENCOUNTER — Ambulatory Visit: Admitting: Physician Assistant

## 2023-11-20 ENCOUNTER — Other Ambulatory Visit: Payer: Self-pay

## 2023-11-20 DIAGNOSIS — Z8781 Personal history of (healed) traumatic fracture: Secondary | ICD-10-CM

## 2023-11-23 ENCOUNTER — Ambulatory Visit
Admission: RE | Admit: 2023-11-23 | Discharge: 2023-11-23 | Disposition: A | Discharge status: Home / Self Care | Source: Ambulatory Visit | Attending: Physician Assistant | Admitting: *Deleted

## 2023-11-23 ENCOUNTER — Ambulatory Visit: Admitting: Physician Assistant

## 2023-11-23 ENCOUNTER — Encounter: Payer: Self-pay | Admitting: Physician Assistant

## 2023-11-23 ENCOUNTER — Ambulatory Visit
Admission: RE | Admit: 2023-11-23 | Discharge: 2023-11-23 | Disposition: A | Discharge status: Home / Self Care | Source: Ambulatory Visit | Attending: Physician Assistant | Admitting: Physician Assistant

## 2023-11-23 ENCOUNTER — Ambulatory Visit
Admission: RE | Admit: 2023-11-23 | Discharge: 2023-11-23 | Disposition: A | Discharge status: Home / Self Care | Source: Home / Self Care | Attending: Physician Assistant | Admitting: Physician Assistant

## 2023-11-23 ENCOUNTER — Ambulatory Visit
Admission: RE | Admit: 2023-11-23 | Discharge: 2023-11-23 | Disposition: A | Discharge status: Home / Self Care | Attending: Physician Assistant | Admitting: Physician Assistant

## 2023-11-23 VITALS — BP 108/64 | Ht 76.0 in | Wt 279.0 lb

## 2023-11-23 DIAGNOSIS — Z8781 Personal history of (healed) traumatic fracture: Secondary | ICD-10-CM

## 2023-11-23 DIAGNOSIS — W19XXXA Unspecified fall, initial encounter: Secondary | ICD-10-CM

## 2023-11-23 DIAGNOSIS — S22089A Unspecified fracture of T11-T12 vertebra, initial encounter for closed fracture: Secondary | ICD-10-CM

## 2023-11-23 NOTE — Progress Notes (Signed)
 Referring Physician:  Concha Se, MD [address] [place],  [place] [address]  Primary Physician:  Gracelyn Nurse, MD  History of Present Illness: 11/23/23 [name] is here today for T11 vertebral body fracture extending through anterior bridging osteophyte into the right anterior inferior endplate after a fall on 11/05/2023.  He was given a TLSO to wear for comfort however states that it did not fit him properly and he has not been wearing it.  He adds that his back primarily hurts when he is getting up from standing for long periods of time.  He denies any pain that radiates to his belly.  He does state that he has some chronic back pain and that will extend down his left leg to the side of his foot although this is unchanged.  No new saddle anesthesia or issues with gait.   Past Surgery: none  Carl [name] has no symptoms of cervical myelopathy.  The symptoms are causing a significant impact on the patient's life.   Review of Systems:  A 10 point review of systems is negative, except for the pertinent positives and negatives detailed in the HPI.  Past Medical History: Past Medical History:  Diagnosis Date   Anemia    Asperger's syndrome    Asthma    Atrial fibrillation (HCC)    Diverticulosis    GERD (gastroesophageal reflux disease)    History of MRSA infection    Hypertension    Hyponatremia    Psoriasis    Valvular heart disease     Past Surgical History: Past Surgical History:  Procedure Laterality Date   CARDIOVERSION N/A 02/27/2022   Procedure: CARDIOVERSION;  Surgeon: Lamar Blinks, MD;  Location: [facility] ORS;  Service: Cardiovascular;  Laterality: N/A;   COLONOSCOPY     COLONOSCOPY WITH PROPOFOL N/A 04/23/2018   Procedure: COLONOSCOPY WITH PROPOFOL;  Surgeon: Christena Deem, MD;  Location: [facility] ENDOSCOPY;  Service: Endoscopy;  Laterality: N/A;    Allergies: Allergies as of 11/11/2023   (No Known Allergies)     Medications: Outpatient Encounter Medications as of 11/11/2023  Medication Sig   albuterol (VENTOLIN HFA) 108 (90 Base) MCG/ACT inhaler Inhale 2 puffs into the lungs every 6 (six) hours as needed for wheezing. (Patient not taking: Reported on 08/14/2023)   alum & mag hydroxide-simeth (MAALOX/MYLANTA) 200-200-20 MG/5ML suspension Take 30 mLs by mouth every 6 (six) hours as needed for indigestion or heartburn.   amLODipine (NORVASC) 5 MG tablet Take 5 mg by mouth daily.   ascorbic acid (VITAMIN C) 1000 MG tablet Take 1,000 mg by mouth daily.   atenolol (TENORMIN) 25 MG tablet Take 25 mg by mouth 2 (two) times daily.   B Complex Vitamins (B COMPLEX PO) Take 1 tablet by mouth daily.   bisacodyl (DULCOLAX) 5 MG EC tablet Take 1 tablet (5 mg total) by mouth daily as needed for moderate constipation.   cetirizine (ZYRTEC) 10 MG tablet Take 10 mg by mouth daily.   cyclobenzaprine (FLEXERIL) 5 MG tablet Take 1 tablet (5 mg total) by mouth 3 (three) times daily as needed for muscle spasms.   flecainide (TAMBOCOR) 100 MG tablet Take 100 mg by mouth 2 (two) times daily.   folic acid (FOLVITE) 1 MG tablet Take 1 mg by mouth daily.   furosemide (LASIX) 20 MG tablet Take 1 tablet (20 mg total) by mouth 2 (two) times daily.   Garlic 100 MG TABS Take 100 mg by mouth daily.  HYDROcodone-acetaminophen (NORCO/VICODIN) 5-325 MG tablet Take 1 tablet by mouth every 6 (six) hours as needed for up to 5 days.   hyoscyamine (LEVBID) 0.375 MG 12 hr tablet Take 1 tablet by mouth in the morning and at bedtime. (Patient not taking: Reported on 02/04/2023)   lisinopril (PRINIVIL,ZESTRIL) 10 MG tablet Take 10 mg by mouth daily.   montelukast (SINGULAIR) 10 MG tablet Take 10 mg by mouth at bedtime.   Multiple Vitamin (MULTIVITAMIN) tablet Take 1 tablet by mouth daily.   Omega-3 Fatty Acids (SALMON OIL PO) Take 1 capsule by mouth daily.   pantoprazole (PROTONIX) 40 MG tablet Take 40 mg by mouth daily.   polyethylene glycol  (MIRALAX / GLYCOLAX) 17 g packet Take 17 g by mouth daily.   rivaroxaban (XARELTO) 20 MG TABS tablet Take 20 mg by mouth daily with supper.   senna-docusate (SENOKOT-S) 8.6-50 MG tablet Take 1 tablet by mouth daily.   sodium chloride 1 g tablet Take 1 tablet (1 g total) by mouth 3 (three) times daily with meals. (Patient not taking: Reported on 08/14/2023)   Turmeric (QC TUMERIC COMPLEX PO) Take 1 tablet by mouth daily.   urea (URE-NA) 15 g PACK oral packet Take 15 g by mouth 2 (two) times daily. (Patient not taking: Reported on 02/04/2023)   Zinc Citrate-Phytase 25-500 MG CAPS Take 1 capsule by mouth daily.   No facility-administered encounter medications on file as of 11/11/2023.    Social History: Social History   Tobacco Use   Smoking status: Never   Smokeless tobacco: Never  Vaping Use   Vaping status: Never Used  Substance Use Topics   Alcohol use: Not Currently    Comment: rarely   Drug use: Never    Family Medical History: No family history on file.  Physical Examination: @VITALWITHPAIN @  General: Patient is well developed, well nourished, calm, collected, and in no apparent distress. Attention to examination is appropriate.  Psychiatric: Patient is non-anxious.  Head:  Pupils equal, round, and reactive to light.  ENT:  Oral mucosa appears well hydrated.  Neck:   Supple.  Full range of motion.  Respiratory: Patient is breathing without any difficulty.  Extremities: No edema.  Vascular: Palpable dorsal pedal pulses.  Skin:   On exposed skin, there are no abnormal skin lesions.  NEUROLOGICAL:     Awake, alert, oriented to person, place, and time.  Speech is clear and fluent. Fund of knowledge is appropriate.   Cranial Nerves: Pupils equal round and reactive to light.  Facial tone is symmetric.      Some mild tenderness to palpation of thoracolumbar spine   Strength:  Side Iliopsoas Quads Hamstring PF DF EHL  R 5 5 5 5 5 5   L 5 5 5 5 5 5    Reflexes are  2+ at the patella and achilles.     Clonus is not present.    Bilateral  lower extremity sensation is intact to light touch.    Patient walks with a forward pitched gait.  Medical Decision Making  Imaging: FINDINGS: CT THORACIC SPINE FINDINGS   Alignment: Normal.   Vertebrae: There is acute undisplaced fracture along the inferior endplate of T11 vertebral body with fracture line extending through the anterior bridging osteophyte into the right anterior inferior endplate. There is no resultant loss of height. There is mild fat stranding in the prevertebral soft tissue. No other acute fracture or focal pathologic process.   Paraspinal and other soft tissues: Negative.  Disc levels: There are mild-to-moderate multilevel degenerative changes characterized by anterior marginal osteophyte formation.   CT LUMBAR SPINE FINDINGS   Segmentation: 5 lumbar type vertebrae.   Alignment: Normal.   Vertebrae: No acute fracture or focal pathologic process.   Paraspinal and other soft tissues: Negative.   Disc levels: Moderate multilevel degenerative changes characterized by reduced intervertebral disc height, facet arthropathy and marginal osteophyte formation.   IMPRESSION: CT thoracic spine impression:   *Acute undisplaced fracture along the inferior endplate of T11 vertebral body with fracture line extending through the anterior bridging osteophyte into the right anterior inferior endplate. No resultant loss of height.   CT lumbar spine impression:   *No acute fracture or traumatic subluxation.  I have personally reviewed the images and agree with the above interpretation.  Assessment and Plan: [name] is a pleasant 73 y.o. male with  T11 vertebral body fracture extending through anterior bridging osteophyte into the right anterior inferior endplate after a fall on 11/05/2023.  He was given a TLSO to wear for comfort however states that it did not fit him properly and he  has not been wearing it.  He adds that his back primarily hurts when he is getting up from standing for long periods of time.  He denies any pain that radiates to his belly.  He does state that he has some chronic back pain and that will extend down his left leg to the side of his foot although this is unchanged.  No new saddle anesthesia or issues with gait.  Some mild tenderness palpation of his thoracolumbar spine on examination.  Otherwise his exam is to baseline.  Is a pleasure to see patient today.  We discussed undergoing x-rays after leaving clinic today.  I will review them once complete.  Advised patient to be wary of heavy lifting.  We discussed potentially starting physical therapy in about a month I am supportive of this.  Encouraged him to reach out for any questions or concerns that he has in the future.  Plan to see back in about 1 month with updated x-rays.   Thank you for involving me in the care of this patient.   I spent a total of 30 minutes in both face-to-face and non-face-to-face activities for this visit on the date of this encounter including preparing to see the patient, obtaining history, performing examination, counseling patient, ordering tests, independently interpreting results.  Ludwig Safer, PA-C Dept. of Neurosurgery

## 2023-12-01 ENCOUNTER — Encounter (INDEPENDENT_AMBULATORY_CARE_PROVIDER_SITE_OTHER): Admitting: Nurse Practitioner

## 2023-12-08 ENCOUNTER — Encounter (INDEPENDENT_AMBULATORY_CARE_PROVIDER_SITE_OTHER): Payer: Self-pay | Admitting: Nurse Practitioner

## 2023-12-18 ENCOUNTER — Other Ambulatory Visit: Payer: Self-pay

## 2023-12-18 DIAGNOSIS — S22089A Unspecified fracture of T11-T12 vertebra, initial encounter for closed fracture: Secondary | ICD-10-CM

## 2023-12-21 ENCOUNTER — Ambulatory Visit: Admitting: Physician Assistant

## 2024-01-21 ENCOUNTER — Encounter: Payer: Self-pay | Admitting: Physician Assistant

## 2024-01-25 ENCOUNTER — Ambulatory Visit (INDEPENDENT_AMBULATORY_CARE_PROVIDER_SITE_OTHER): Admitting: Nurse Practitioner

## 2024-01-25 ENCOUNTER — Encounter (INDEPENDENT_AMBULATORY_CARE_PROVIDER_SITE_OTHER): Payer: Self-pay | Admitting: Nurse Practitioner

## 2024-01-25 VITALS — BP 125/77 | HR 58 | Resp 16 | Ht 76.0 in | Wt 254.0 lb

## 2024-01-25 DIAGNOSIS — I872 Venous insufficiency (chronic) (peripheral): Secondary | ICD-10-CM | POA: Diagnosis not present

## 2024-01-25 DIAGNOSIS — I10 Essential (primary) hypertension: Secondary | ICD-10-CM | POA: Diagnosis not present

## 2024-01-25 DIAGNOSIS — E119 Type 2 diabetes mellitus without complications: Secondary | ICD-10-CM | POA: Diagnosis not present

## 2024-01-25 DIAGNOSIS — L602 Onychogryphosis: Secondary | ICD-10-CM | POA: Diagnosis not present

## 2024-01-25 NOTE — Progress Notes (Signed)
 Subjective:    Patient ID: Carl [name], male    DOB: 10-10-1950, 73 y.o.   MRN: [id number] Chief Complaint  Patient presents with   Establish Care    New Patient consult Chronic Venous Insufficiency Ref.Wouk    The patient is a 73 year old male who presents today as a referral from the hospital in regards to chronic venous insufficiency.  The patient had a fall sometime in January which resulted in cellulitis.  It was noted that he also had swelling with evidence of chronic venous insufficiency by way of stasis dermatitis.  The patient does note that he has swelling of his lower extremities but he denies any current open wounds, ulcerations, weeping or evidence of cellulitis.  He does have compression but does not utilize them consistently.  He does try to elevate his lower extremities and is also active several times per week.    Review of Systems  Cardiovascular:  Positive for leg swelling.  All other systems reviewed and are negative.      Objective:   Physical Exam Vitals reviewed.  HENT:     Head: Normocephalic.   Cardiovascular:     Rate and Rhythm: Normal rate.  Pulmonary:     Effort: Pulmonary effort is normal.   Skin:    General: Skin is warm and dry.   Neurological:     Mental Status: He is alert and oriented to person, place, and time.   Psychiatric:        Mood and Affect: Mood normal.        Behavior: Behavior normal.        Thought Content: Thought content normal.        Judgment: Judgment normal.     BP 125/77 (BP Location: Left Arm, Patient Position: Sitting, Cuff Size: Large)   Pulse (!) 58   Resp 16   Ht 6' 4 (1.93 m)   Wt 254 lb (115.2 kg)   BMI 30.92 kg/m   Past Medical History:  Diagnosis Date   Anemia    Asperger's syndrome    Asthma    Atrial fibrillation (HCC)    Diverticulosis    GERD (gastroesophageal reflux disease)    History of MRSA infection    Hypertension    Hyponatremia    Psoriasis    Valvular heart disease      Social History   Socioeconomic History   Marital status: Single    Spouse name: Not on file   Number of children: Not on file   Years of education: Not on file   Highest education level: Not on file  Occupational History   Not on file  Tobacco Use   Smoking status: Never   Smokeless tobacco: Never  Vaping Use   Vaping status: Never Used  Substance and Sexual Activity   Alcohol use: Not Currently    Comment: rarely   Drug use: Never   Sexual activity: Not on file  Other Topics Concern   Not on file  Social History Narrative   Not on file   Social Drivers of Health   Financial Resource Strain: Not on file  Food Insecurity: No Food Insecurity (08/14/2023)   Hunger Vital Sign    Worried About Running Out of Food in the Last Year: Never true    Ran Out of Food in the Last Year: Never true  Transportation Needs: No Transportation Needs (01/31/2023)   PRAPARE - Administrator, Civil Service (Medical): No  Lack of Transportation (Non-Medical): No  Physical Activity: Not on file  Stress: Not on file  Social Connections: Not on file  Intimate Partner Violence: Not At Risk (01/31/2023)   Humiliation, Afraid, Rape, and Kick questionnaire    Fear of Current or Ex-Partner: No    Emotionally Abused: No    Physically Abused: No    Sexually Abused: No    Past Surgical History:  Procedure Laterality Date   CARDIOVERSION N/A 02/27/2022   Procedure: CARDIOVERSION;  Surgeon: Michelle Aid, MD;  Location: [facility] ORS;  Service: Cardiovascular;  Laterality: N/A;   COLONOSCOPY     COLONOSCOPY WITH PROPOFOL  N/A 04/23/2018   Procedure: COLONOSCOPY WITH PROPOFOL ;  Surgeon: Deveron Fly, MD;  Location: [facility] ENDOSCOPY;  Service: Endoscopy;  Laterality: N/A;    History reviewed. No pertinent family history.  No Known Allergies     Latest Ref Rng & Units 11/09/2023    2:41 PM 11/05/2023    1:52 PM 08/16/2023    5:41 AM  CBC  WBC 4.0 - 10.5 K/uL 5.8  5.5  5.7    Hemoglobin 13.0 - 17.0 g/dL 16.1  09.6  04.5   Hematocrit 39.0 - 52.0 % 31.0  37.7  32.9   Platelets 150 - 400 K/uL 181  191  238       CMP     Component Value Date/Time   NA 126 (L) 11/05/2023 1352   K 4.4 11/05/2023 1352   CL 90 (L) 11/05/2023 1352   CO2 25 11/05/2023 1352   GLUCOSE 103 (H) 11/05/2023 1352   BUN 22 11/05/2023 1352   CREATININE 0.74 11/05/2023 1352   CALCIUM 9.2 11/05/2023 1352   PROT 7.4 11/05/2023 1352   ALBUMIN 4.3 11/05/2023 1352   AST 26 11/05/2023 1352   ALT 25 11/05/2023 1352   ALKPHOS 70 11/05/2023 1352   BILITOT 1.4 (H) 11/05/2023 1352   GFRNONAA >60 11/05/2023 1352     No results found.     Assessment & Plan:   1. Chronic venous insufficiency (Primary) No surgery or intervention at this point in time.   The patient is CEAP [id number]   I have discussed with the patient venous insufficiency and why it  causes symptoms. I have discussed with the patient the chronic skin changes that accompany venous insufficiency and the long term sequela such as infection and ulceration.  Patient will begin wearing graduated compression stockings or compression wraps on a daily basis.  The patient will put the compression on first thing in the morning and removing them in the evening. The patient is instructed specifically not to sleep in the compression.    In addition, behavioral modification including several periods of elevation of the lower extremities during the day will be continued. I have demonstrated that proper elevation is a position with the ankles at heart level.  The patient is instructed to begin routine exercise, especially walking on a daily basis  Patient should undergo duplex ultrasound of the venous system to ensure that DVT or reflux is not present.  Following the review of the ultrasound, we willreassess the degree of swelling and the control that graduated compression stockings or compression wraps  is offering.   At that time the  patient can be assessed for a Lymph Pump depending on the effectiveness of conservative therapy and the control of the associated lymphedema.  2. Primary hypertension Continue antihypertensive medications as already ordered, these medications have been reviewed and there are no changes  at this time.  3. Type 2 diabetes mellitus without complication, without long-term current use of insulin (HCC) Continue hypoglycemic medications as already ordered, these medications have been reviewed and there are no changes at this time.  Hgb A1C to be monitored as already arranged by primary service  4. Overgrown toenails Some overgrown toenails which could use attention.  Plus he also has an area which may be athlete's foot related.  Will refer him back to Dr. Roderic City who he has seen previously in the past   Current Outpatient Medications on File Prior to Visit  Medication Sig Dispense Refill   albuterol  (VENTOLIN  HFA) 108 (90 Base) MCG/ACT inhaler Inhale 2 puffs into the lungs every 6 (six) hours as needed for wheezing.     alum & mag hydroxide-simeth (MAALOX/MYLANTA) 200-200-20 MG/5ML suspension Take 30 mLs by mouth every 6 (six) hours as needed for indigestion or heartburn. 355 mL 0   amLODipine  (NORVASC ) 5 MG tablet Take 5 mg by mouth daily.     ascorbic acid  (VITAMIN C ) 1000 MG tablet Take 1,000 mg by mouth daily.     atenolol  (TENORMIN ) 25 MG tablet Take 25 mg by mouth 2 (two) times daily.     B Complex Vitamins (B COMPLEX PO) Take 1 tablet by mouth daily.     bisacodyl  (DULCOLAX) 5 MG EC tablet Take 1 tablet (5 mg total) by mouth daily as needed for moderate constipation. 30 tablet 0   cetirizine (ZYRTEC) 10 MG tablet Take 10 mg by mouth daily.     cyclobenzaprine  (FLEXERIL ) 5 MG tablet Take 1 tablet (5 mg total) by mouth 3 (three) times daily as needed for muscle spasms. 30 tablet 0   flecainide  (TAMBOCOR ) 100 MG tablet Take 100 mg by mouth 2 (two) times daily.     folic acid  (FOLVITE ) 1 MG tablet  Take 1 mg by mouth daily.     furosemide  (LASIX ) 20 MG tablet Take 1 tablet (20 mg total) by mouth 2 (two) times daily.     Garlic  100 MG TABS Take 100 mg by mouth daily.     hyoscyamine  (LEVBID ) 0.375 MG 12 hr tablet Take 1 tablet by mouth in the morning and at bedtime.     lisinopril  (PRINIVIL ,ZESTRIL ) 10 MG tablet Take 10 mg by mouth daily.     montelukast  (SINGULAIR ) 10 MG tablet Take 10 mg by mouth at bedtime.     Multiple Vitamin (MULTIVITAMIN) tablet Take 1 tablet by mouth daily.     nitrofurantoin, macrocrystal-monohydrate, (MACROBID) 100 MG capsule Take 100 mg by mouth 2 (two) times daily.     Omega-3 Fatty Acids (SALMON OIL PO) Take 1 capsule by mouth daily.     pantoprazole  (PROTONIX ) 40 MG tablet Take 40 mg by mouth daily.     polyethylene glycol (MIRALAX  / GLYCOLAX ) 17 g packet Take 17 g by mouth daily. 14 each 0   rivaroxaban  (XARELTO ) 20 MG TABS tablet Take 20 mg by mouth daily with supper.     senna-docusate (SENOKOT-S) 8.6-50 MG tablet Take 1 tablet by mouth daily. 30 tablet 0   sodium chloride  1 g tablet Take 1 tablet (1 g total) by mouth 3 (three) times daily with meals. 30 tablet 0   Turmeric (QC TUMERIC COMPLEX PO) Take 1 tablet by mouth daily.     urea  (URE-NA) 15 g PACK oral packet Take 15 g by mouth 2 (two) times daily. 60 packet 1   Zinc  Citrate-Phytase 25-500 MG CAPS Take 1  capsule by mouth daily.     No current facility-administered medications on file prior to visit.    There are no Patient Instructions on file for this visit. No follow-ups on file.   Ashe Graybeal E Itzael Liptak, NP

## 2024-02-08 ENCOUNTER — Encounter: Payer: Self-pay | Admitting: Physician Assistant

## 2024-02-08 ENCOUNTER — Ambulatory Visit
Admission: RE | Admit: 2024-02-08 | Discharge: 2024-02-08 | Disposition: A | Discharge status: Home / Self Care | Source: Ambulatory Visit | Attending: Physician Assistant | Admitting: Physician Assistant

## 2024-02-08 ENCOUNTER — Ambulatory Visit: Admitting: Physician Assistant

## 2024-02-08 VITALS — BP 134/62 | Ht 76.0 in | Wt 255.1 lb

## 2024-02-08 DIAGNOSIS — S22089D Unspecified fracture of T11-T12 vertebra, subsequent encounter for fracture with routine healing: Secondary | ICD-10-CM | POA: Diagnosis not present

## 2024-02-08 DIAGNOSIS — S22089A Unspecified fracture of T11-T12 vertebra, initial encounter for closed fracture: Secondary | ICD-10-CM

## 2024-02-08 DIAGNOSIS — W19XXXD Unspecified fall, subsequent encounter: Secondary | ICD-10-CM | POA: Diagnosis not present

## 2024-02-08 NOTE — Progress Notes (Signed)
 Referring Physician:  Ernest Ronal BRAVO, MD [address] [place],  [place] [address]  Primary Physician:  Rudolpho Norleen BIRCH, MD  History of Present Illness: 02/08/24 [name] is here today for T11 vertebral body fracture extending through anterior bridging osteophyte into the right anterior inferior endplate after a fall on 11/05/2023.  He is currently in no pain.  He has not had any recent falls.  No numbness, tingling or weakness.   Past Surgery: none  Carl [name] has no symptoms of cervical myelopathy.  The symptoms are causing a significant impact on the patient's life.   Review of Systems:  A 10 point review of systems is negative, except for the pertinent positives and negatives detailed in the HPI.  Past Medical History: Past Medical History:  Diagnosis Date   Anemia    Asperger's syndrome    Asthma    Atrial fibrillation (HCC)    Diverticulosis    GERD (gastroesophageal reflux disease)    History of MRSA infection    Hypertension    Hyponatremia    Psoriasis    Valvular heart disease     Past Surgical History: Past Surgical History:  Procedure Laterality Date   CARDIOVERSION N/A 02/27/2022   Procedure: CARDIOVERSION;  Surgeon: Hester Wolm PARAS, MD;  Location: [facility] ORS;  Service: Cardiovascular;  Laterality: N/A;   COLONOSCOPY     COLONOSCOPY WITH PROPOFOL  N/A 04/23/2018   Procedure: COLONOSCOPY WITH PROPOFOL ;  Surgeon: Gaylyn Gladis PENNER, MD;  Location: [facility] ENDOSCOPY;  Service: Endoscopy;  Laterality: N/A;    Allergies: Allergies as of 11/11/2023   (No Known Allergies)    Medications: Outpatient Encounter Medications as of 11/11/2023  Medication Sig   albuterol  (VENTOLIN  HFA) 108 (90 Base) MCG/ACT inhaler Inhale 2 puffs into the lungs every 6 (six) hours as needed for wheezing. (Patient not taking: Reported on 08/14/2023)   alum & mag hydroxide-simeth (MAALOX/MYLANTA) 200-200-20 MG/5ML suspension Take 30 mLs by mouth every 6 (six) hours  as needed for indigestion or heartburn.   amLODipine  (NORVASC ) 5 MG tablet Take 5 mg by mouth daily.   ascorbic acid  (VITAMIN C ) 1000 MG tablet Take 1,000 mg by mouth daily.   atenolol  (TENORMIN ) 25 MG tablet Take 25 mg by mouth 2 (two) times daily.   B Complex Vitamins (B COMPLEX PO) Take 1 tablet by mouth daily.   bisacodyl  (DULCOLAX) 5 MG EC tablet Take 1 tablet (5 mg total) by mouth daily as needed for moderate constipation.   cetirizine (ZYRTEC) 10 MG tablet Take 10 mg by mouth daily.   cyclobenzaprine  (FLEXERIL ) 5 MG tablet Take 1 tablet (5 mg total) by mouth 3 (three) times daily as needed for muscle spasms.   flecainide  (TAMBOCOR ) 100 MG tablet Take 100 mg by mouth 2 (two) times daily.   folic acid  (FOLVITE ) 1 MG tablet Take 1 mg by mouth daily.   furosemide  (LASIX ) 20 MG tablet Take 1 tablet (20 mg total) by mouth 2 (two) times daily.   Garlic  100 MG TABS Take 100 mg by mouth daily.   HYDROcodone -acetaminophen  (NORCO/VICODIN) 5-325 MG tablet Take 1 tablet by mouth every 6 (six) hours as needed for up to 5 days.   hyoscyamine  (LEVBID ) 0.375 MG 12 hr tablet Take 1 tablet by mouth in the morning and at bedtime. (Patient not taking: Reported on 02/04/2023)   lisinopril  (PRINIVIL ,ZESTRIL ) 10 MG tablet Take 10 mg by mouth daily.   montelukast  (SINGULAIR ) 10 MG tablet Take 10 mg by  mouth at bedtime.   Multiple Vitamin (MULTIVITAMIN) tablet Take 1 tablet by mouth daily.   Omega-3 Fatty Acids (SALMON OIL PO) Take 1 capsule by mouth daily.   pantoprazole  (PROTONIX ) 40 MG tablet Take 40 mg by mouth daily.   polyethylene glycol (MIRALAX  / GLYCOLAX ) 17 g packet Take 17 g by mouth daily.   rivaroxaban  (XARELTO ) 20 MG TABS tablet Take 20 mg by mouth daily with supper.   senna-docusate (SENOKOT-S) 8.6-50 MG tablet Take 1 tablet by mouth daily.   sodium chloride  1 g tablet Take 1 tablet (1 g total) by mouth 3 (three) times daily with meals. (Patient not taking: Reported on 08/14/2023)   Turmeric (QC  TUMERIC COMPLEX PO) Take 1 tablet by mouth daily.   urea  (URE-NA) 15 g PACK oral packet Take 15 g by mouth 2 (two) times daily. (Patient not taking: Reported on 02/04/2023)   Zinc  Citrate-Phytase 25-500 MG CAPS Take 1 capsule by mouth daily.   No facility-administered encounter medications on file as of 11/11/2023.    Social History: Social History   Tobacco Use   Smoking status: Never   Smokeless tobacco: Never  Vaping Use   Vaping status: Never Used  Substance Use Topics   Alcohol use: Not Currently    Comment: rarely   Drug use: Never    Family Medical History: No family history on file.  Physical Examination: @VITALWITHPAIN @  General: Patient is well developed, well nourished, calm, collected, and in no apparent distress. Attention to examination is appropriate.  Psychiatric: Patient is non-anxious.  Head:  Pupils equal, round, and reactive to light.  ENT:  Oral mucosa appears well hydrated.  Neck:   Supple.  Full range of motion.  Respiratory: Patient is breathing without any difficulty.  Extremities: No edema.  Vascular: Palpable dorsal pedal pulses.  Skin:   On exposed skin, there are no abnormal skin lesions.  NEUROLOGICAL:     Awake, alert, oriented to person, place, and time.  Speech is clear and fluent. Fund of knowledge is appropriate.   Cranial Nerves: Pupils equal round and reactive to light.  Facial tone is symmetric.      Some mild tenderness to palpation of thoracolumbar spine   Strength:  Side Iliopsoas Quads Hamstring PF DF EHL  R 5 5 5 5 5 5   L 5 5 5 5 5 5    Reflexes are 2+ at the patella and achilles.     Clonus is not present.    Bilateral  lower extremity sensation is intact to light touch.    Patient walks with a forward pitched gait.  Medical Decision Making  Imaging: FINDINGS: CT THORACIC SPINE FINDINGS   Alignment: Normal.   Vertebrae: There is acute undisplaced fracture along the inferior endplate of T11 vertebral body  with fracture line extending through the anterior bridging osteophyte into the right anterior inferior endplate. There is no resultant loss of height. There is mild fat stranding in the prevertebral soft tissue. No other acute fracture or focal pathologic process.   Paraspinal and other soft tissues: Negative.   Disc levels: There are mild-to-moderate multilevel degenerative changes characterized by anterior marginal osteophyte formation.   CT LUMBAR SPINE FINDINGS   Segmentation: 5 lumbar type vertebrae.   Alignment: Normal.   Vertebrae: No acute fracture or focal pathologic process.   Paraspinal and other soft tissues: Negative.   Disc levels: Moderate multilevel degenerative changes characterized by reduced intervertebral disc height, facet arthropathy and marginal osteophyte formation.  IMPRESSION: CT thoracic spine impression:   *Acute undisplaced fracture along the inferior endplate of T11 vertebral body with fracture line extending through the anterior bridging osteophyte into the right anterior inferior endplate. No resultant loss of height.   CT lumbar spine impression:   *No acute fracture or traumatic subluxation.     I have personally reviewed the images and agree with the above interpretation.  Assessment and Plan: [name] is a pleasant 73 y.o. male with  T11 vertebral body fracture extending through anterior bridging osteophyte into the right anterior inferior endplate after a fall on 11/05/2023.  Overall he is doing very well.  He does not have any pain at this time.  No recent falls.  No weakness, numbness or tingling.  Will await final radiology read to ensure no significant progression of fracture, however no need for formal scheduled follow-up for this patient.  Advised him to contact us  for any questions or concerns she has in the future.   Thank you for involving me in the care of this patient.   I spent a total of 30 minutes in both  face-to-face and non-face-to-face activities for this visit on the date of this encounter including preparing to see the patient, obtaining history, performing examination, counseling patient.  Carl Decamp, PA-C Dept. of Neurosurgery

## 2024-02-19 ENCOUNTER — Other Ambulatory Visit (INDEPENDENT_AMBULATORY_CARE_PROVIDER_SITE_OTHER): Payer: Self-pay | Admitting: Nurse Practitioner

## 2024-02-19 DIAGNOSIS — I872 Venous insufficiency (chronic) (peripheral): Secondary | ICD-10-CM

## 2024-02-26 ENCOUNTER — Ambulatory Visit (INDEPENDENT_AMBULATORY_CARE_PROVIDER_SITE_OTHER): Admitting: Nurse Practitioner

## 2024-02-26 ENCOUNTER — Encounter (INDEPENDENT_AMBULATORY_CARE_PROVIDER_SITE_OTHER)
